# Patient Record
Sex: Female | Born: 1955
Health system: Southern US, Community
[De-identification: ages and names within clinical notes are randomized; demographics above are authoritative.]

## PROBLEM LIST (undated history)

## (undated) DIAGNOSIS — E785 Hyperlipidemia, unspecified: Secondary | ICD-10-CM

## (undated) DIAGNOSIS — J45909 Unspecified asthma, uncomplicated: Secondary | ICD-10-CM

## (undated) DIAGNOSIS — I10 Essential (primary) hypertension: Secondary | ICD-10-CM

## (undated) DIAGNOSIS — K759 Inflammatory liver disease, unspecified: Secondary | ICD-10-CM

## (undated) DIAGNOSIS — T7840XA Allergy, unspecified, initial encounter: Secondary | ICD-10-CM

## (undated) HISTORY — DX: Allergy, unspecified, initial encounter: T78.40XA

## (undated) HISTORY — PX: ROTATOR CUFF REPAIR: SHX139

## (undated) HISTORY — PX: SEPTOPLASTY: SHX2393

## (undated) HISTORY — PX: TUBAL LIGATION: SHX77

## (undated) HISTORY — DX: Unspecified asthma, uncomplicated: J45.909

---

## 1979-12-14 DIAGNOSIS — K759 Inflammatory liver disease, unspecified: Secondary | ICD-10-CM

## 1979-12-14 HISTORY — DX: Inflammatory liver disease, unspecified: K75.9

## 2000-06-21 ENCOUNTER — Inpatient Hospital Stay (HOSPITAL_COMMUNITY): Admission: EM | Admit: 2000-06-21 | Discharge: 2000-06-22 | Payer: Self-pay | Admitting: Emergency Medicine

## 2000-06-21 ENCOUNTER — Encounter: Payer: Self-pay | Admitting: Family Medicine

## 2000-06-30 ENCOUNTER — Encounter: Admission: RE | Admit: 2000-06-30 | Discharge: 2000-09-28 | Payer: Self-pay | Admitting: Internal Medicine

## 2001-06-12 ENCOUNTER — Encounter: Admission: RE | Admit: 2001-06-12 | Discharge: 2001-09-10 | Payer: Self-pay | Admitting: Internal Medicine

## 2001-08-23 ENCOUNTER — Emergency Department (HOSPITAL_COMMUNITY): Admission: EM | Admit: 2001-08-23 | Discharge: 2001-08-23 | Payer: Self-pay | Admitting: Emergency Medicine

## 2001-08-23 ENCOUNTER — Encounter: Payer: Self-pay | Admitting: Emergency Medicine

## 2001-10-05 ENCOUNTER — Encounter: Payer: Self-pay | Admitting: Internal Medicine

## 2001-10-05 ENCOUNTER — Encounter: Admission: RE | Admit: 2001-10-05 | Discharge: 2001-10-05 | Payer: Self-pay | Admitting: Internal Medicine

## 2003-03-31 ENCOUNTER — Emergency Department (HOSPITAL_COMMUNITY): Admission: EM | Admit: 2003-03-31 | Discharge: 2003-03-31 | Payer: Self-pay | Admitting: Emergency Medicine

## 2003-03-31 ENCOUNTER — Encounter: Payer: Self-pay | Admitting: Emergency Medicine

## 2005-05-28 ENCOUNTER — Ambulatory Visit: Payer: Self-pay | Admitting: Internal Medicine

## 2005-06-07 ENCOUNTER — Ambulatory Visit: Payer: Self-pay | Admitting: Internal Medicine

## 2005-06-08 ENCOUNTER — Ambulatory Visit: Payer: Self-pay | Admitting: *Deleted

## 2005-06-18 ENCOUNTER — Ambulatory Visit (HOSPITAL_COMMUNITY): Admission: RE | Admit: 2005-06-18 | Discharge: 2005-06-18 | Payer: Self-pay | Admitting: Family Medicine

## 2005-06-29 ENCOUNTER — Encounter: Admission: RE | Admit: 2005-06-29 | Discharge: 2005-06-29 | Payer: Self-pay | Admitting: Family Medicine

## 2005-09-10 ENCOUNTER — Ambulatory Visit: Payer: Self-pay | Admitting: Internal Medicine

## 2006-04-13 ENCOUNTER — Other Ambulatory Visit: Admission: RE | Admit: 2006-04-13 | Discharge: 2006-04-13 | Payer: Self-pay | Admitting: Internal Medicine

## 2008-06-01 ENCOUNTER — Emergency Department (HOSPITAL_COMMUNITY): Admission: EM | Admit: 2008-06-01 | Discharge: 2008-06-01 | Payer: Self-pay | Admitting: Emergency Medicine

## 2009-10-31 ENCOUNTER — Encounter: Payer: Self-pay | Admitting: Emergency Medicine

## 2009-10-31 ENCOUNTER — Ambulatory Visit: Payer: Self-pay | Admitting: Diagnostic Radiology

## 2009-11-01 ENCOUNTER — Inpatient Hospital Stay (HOSPITAL_COMMUNITY): Admission: EM | Admit: 2009-11-01 | Discharge: 2009-11-02 | Payer: Self-pay | Admitting: Internal Medicine

## 2009-11-18 ENCOUNTER — Inpatient Hospital Stay (HOSPITAL_COMMUNITY): Admission: EM | Admit: 2009-11-18 | Discharge: 2009-11-21 | Payer: Self-pay | Admitting: Emergency Medicine

## 2010-03-31 ENCOUNTER — Encounter: Admission: RE | Admit: 2010-03-31 | Discharge: 2010-03-31 | Payer: Self-pay | Admitting: Family Medicine

## 2011-03-16 LAB — GLUCOSE, CAPILLARY
Glucose-Capillary: 103 mg/dL — ABNORMAL HIGH (ref 70–99)
Glucose-Capillary: 110 mg/dL — ABNORMAL HIGH (ref 70–99)
Glucose-Capillary: 120 mg/dL — ABNORMAL HIGH (ref 70–99)
Glucose-Capillary: 131 mg/dL — ABNORMAL HIGH (ref 70–99)
Glucose-Capillary: 134 mg/dL — ABNORMAL HIGH (ref 70–99)
Glucose-Capillary: 150 mg/dL — ABNORMAL HIGH (ref 70–99)
Glucose-Capillary: 229 mg/dL — ABNORMAL HIGH (ref 70–99)
Glucose-Capillary: 244 mg/dL — ABNORMAL HIGH (ref 70–99)
Glucose-Capillary: 250 mg/dL — ABNORMAL HIGH (ref 70–99)
Glucose-Capillary: 251 mg/dL — ABNORMAL HIGH (ref 70–99)
Glucose-Capillary: 253 mg/dL — ABNORMAL HIGH (ref 70–99)
Glucose-Capillary: 266 mg/dL — ABNORMAL HIGH (ref 70–99)
Glucose-Capillary: 287 mg/dL — ABNORMAL HIGH (ref 70–99)
Glucose-Capillary: 521 mg/dL (ref 70–99)
Glucose-Capillary: 558 mg/dL (ref 70–99)

## 2011-03-16 LAB — BASIC METABOLIC PANEL
BUN: 16 mg/dL (ref 6–23)
BUN: 23 mg/dL (ref 6–23)
BUN: 6 mg/dL (ref 6–23)
CO2: 16 mEq/L — ABNORMAL LOW (ref 19–32)
Calcium: 8.2 mg/dL — ABNORMAL LOW (ref 8.4–10.5)
Chloride: 108 mEq/L (ref 96–112)
Chloride: 110 mEq/L (ref 96–112)
Chloride: 110 mEq/L (ref 96–112)
Creatinine, Ser: 0.79 mg/dL (ref 0.4–1.2)
GFR calc Af Amer: 55 mL/min — ABNORMAL LOW (ref >60)
GFR calc Af Amer: >60 mL/min (ref >60)
GFR calc Af Amer: >60 mL/min (ref >60)
GFR calc Af Amer: >60 mL/min (ref >60)
GFR calc non Af Amer: >60 mL/min (ref >60)
Glucose, Bld: 275 mg/dL — ABNORMAL HIGH (ref 70–99)
Glucose, Bld: 630 mg/dL (ref 70–99)
Potassium: 4.2 mEq/L (ref 3.5–5.1)
Potassium: 5.4 mEq/L — ABNORMAL HIGH (ref 3.5–5.1)
Sodium: 134 mEq/L — ABNORMAL LOW (ref 135–145)
Sodium: 138 mEq/L (ref 135–145)

## 2011-03-16 LAB — CBC
HCT: 29.8 % — ABNORMAL LOW (ref 36.0–46.0)
HCT: 40.1 % (ref 36.0–46.0)
Hemoglobin: 10 g/dL — ABNORMAL LOW (ref 12.0–15.0)
Hemoglobin: 13.8 g/dL (ref 12.0–15.0)
MCHC: 32.7 g/dL (ref 30.0–36.0)
MCHC: 32.9 g/dL (ref 30.0–36.0)
MCV: 85.6 fL (ref 78.0–100.0)
MCV: 86 fL (ref 78.0–100.0)
MCV: 86.3 fL (ref 78.0–100.0)
Platelets: 159 10*3/uL (ref 150–400)
Platelets: 199 10*3/uL (ref 150–400)
Platelets: 301 10*3/uL (ref 150–400)
RBC: 3.49 MIL/uL — ABNORMAL LOW (ref 3.87–5.11)
RBC: 3.84 MIL/uL — ABNORMAL LOW (ref 3.87–5.11)
RDW: 13.9 % (ref 11.5–15.5)
RDW: 14.1 % (ref 11.5–15.5)
WBC: 3.3 10*3/uL — ABNORMAL LOW (ref 4.0–10.5)

## 2011-03-16 LAB — URINALYSIS, ROUTINE W REFLEX MICROSCOPIC
Bilirubin Urine: NEGATIVE
Ketones, ur: >80 mg/dL — AB
Nitrite: NEGATIVE
Protein, ur: NEGATIVE mg/dL
Specific Gravity, Urine: 1.031 — ABNORMAL HIGH (ref 1.005–1.030)
Urobilinogen, UA: 0.2 mg/dL (ref 0.0–1.0)

## 2011-03-16 LAB — DIFFERENTIAL
Basophils Absolute: 0 10*3/uL (ref 0.0–0.1)
Basophils Absolute: 0.2 10*3/uL — ABNORMAL HIGH (ref 0.0–0.1)
Basophils Relative: 0 % (ref 0–1)
Basophils Relative: 1 % (ref 0–1)
Eosinophils Absolute: 0.1 10*3/uL (ref 0.0–0.7)
Eosinophils Relative: 0 % (ref 0–5)
Eosinophils Relative: 6 % — ABNORMAL HIGH (ref 0–5)
Lymphocytes Relative: 37 % (ref 12–46)
Lymphs Abs: 1.6 10*3/uL (ref 0.7–4.0)
Monocytes Absolute: 0.7 10*3/uL (ref 0.1–1.0)
Monocytes Relative: 4 % (ref 3–12)
Monocytes Relative: 9 % (ref 3–12)
Neutro Abs: 8.3 10*3/uL — ABNORMAL HIGH (ref 1.7–7.7)
Neutrophils Relative %: 75 % (ref 43–77)

## 2011-03-16 LAB — MAGNESIUM
Magnesium: 1.6 mg/dL (ref 1.5–2.5)
Magnesium: 1.7 mg/dL (ref 1.5–2.5)

## 2011-03-16 LAB — COMPREHENSIVE METABOLIC PANEL
AST: 136 U/L — ABNORMAL HIGH (ref 0–37)
CO2: 28 mEq/L (ref 19–32)
Calcium: 8.3 mg/dL — ABNORMAL LOW (ref 8.4–10.5)
Creatinine, Ser: 0.61 mg/dL (ref 0.4–1.2)
GFR calc Af Amer: >60 mL/min (ref >60)
GFR calc non Af Amer: >60 mL/min (ref >60)
Sodium: 138 mEq/L (ref 135–145)
Total Protein: 4.9 g/dL — ABNORMAL LOW (ref 6.0–8.3)

## 2011-03-16 LAB — CULTURE, BLOOD (ROUTINE X 2): Culture: NO GROWTH

## 2011-03-16 LAB — PHOSPHORUS: Phosphorus: 3.9 mg/dL (ref 2.3–4.6)

## 2011-03-16 LAB — URINE MICROSCOPIC-ADD ON

## 2011-03-16 LAB — HEMOGLOBIN A1C
Hgb A1c MFr Bld: 8.8 % — ABNORMAL HIGH (ref 4.6–6.1)
Mean Plasma Glucose: 206 mg/dL

## 2011-03-16 LAB — KETONES, QUALITATIVE

## 2011-03-17 LAB — COMPREHENSIVE METABOLIC PANEL
ALT: 95 U/L — ABNORMAL HIGH (ref 0–35)
Calcium: 8.8 mg/dL (ref 8.4–10.5)
GFR calc Af Amer: >60 mL/min (ref >60)
Glucose, Bld: 209 mg/dL — ABNORMAL HIGH (ref 70–99)
Sodium: 140 mEq/L (ref 135–145)
Total Protein: 5.5 g/dL — ABNORMAL LOW (ref 6.0–8.3)

## 2011-03-17 LAB — CBC
Hemoglobin: 12.8 g/dL (ref 12.0–15.0)
MCHC: 33.8 g/dL (ref 30.0–36.0)
MCHC: 34 g/dL (ref 30.0–36.0)
MCHC: 33.7 g/dL (ref 30.0–36.0)
MCV: 84.5 fL (ref 78.0–100.0)
MCV: 85.5 fL (ref 78.0–100.0)
Platelets: 203 10*3/uL (ref 150–400)
Platelets: 213 10*3/uL (ref 150–400)
Platelets: 261 10*3/uL (ref 150–400)
RDW: 13.3 % (ref 11.5–15.5)
RDW: 13.7 % (ref 11.5–15.5)

## 2011-03-17 LAB — URINALYSIS, ROUTINE W REFLEX MICROSCOPIC
Ketones, ur: NEGATIVE mg/dL
Nitrite: NEGATIVE
Protein, ur: NEGATIVE mg/dL
pH: 5.5 (ref 5.0–8.0)

## 2011-03-17 LAB — GLUCOSE, CAPILLARY
Glucose-Capillary: 187 mg/dL — ABNORMAL HIGH (ref 70–99)
Glucose-Capillary: 190 mg/dL — ABNORMAL HIGH (ref 70–99)
Glucose-Capillary: 240 mg/dL — ABNORMAL HIGH (ref 70–99)
Glucose-Capillary: 301 mg/dL — ABNORMAL HIGH (ref 70–99)

## 2011-03-17 LAB — BASIC METABOLIC PANEL
BUN: 12 mg/dL (ref 6–23)
BUN: 14 mg/dL (ref 6–23)
CO2: 27 mEq/L (ref 19–32)
CO2: 28 mEq/L (ref 19–32)
Calcium: 8.8 mg/dL (ref 8.4–10.5)
Chloride: 101 mEq/L (ref 96–112)
Chloride: 104 mEq/L (ref 96–112)
Creatinine, Ser: 0.64 mg/dL (ref 0.4–1.2)
Creatinine, Ser: 0.6 mg/dL (ref 0.4–1.2)
Glucose, Bld: 302 mg/dL — ABNORMAL HIGH (ref 70–99)

## 2011-03-17 LAB — DIFFERENTIAL
Basophils Relative: 1 % (ref 0–1)
Eosinophils Absolute: 0.3 10*3/uL (ref 0.0–0.7)
Neutrophils Relative %: 57 % (ref 43–77)

## 2011-03-17 LAB — URINE MICROSCOPIC-ADD ON

## 2011-03-17 LAB — URINE CULTURE
Colony Count: 5000
Special Requests: POSITIVE

## 2011-03-17 LAB — T4, FREE: Free T4: 1.2 ng/dL (ref 0.80–1.80)

## 2011-04-30 NOTE — Discharge Summary (Signed)
[facility]  Patient:    Kerry Compton, Kerry Compton                 MRN: [id number] Adm. Date:  [id number] Disc. Date: [id number] Attending:  Miguel Aschoff CC:         Kerry Compton, M.D.  Kerry Compton, M.D.   Discharge Summary  DATE OF BIRTH:  06-01-1956.  CONSULTS:  None.  PROCEDURES:  None.  DISCHARGE DIAGNOSES: 1. Severe gastroenteritis with intractable nausea, vomiting, and diarrhea. 2. Dehydration secondary to above. 3. Insulin requiring diabetes mellitus. 4. History of hypercholesterolemia. 5. History of preeclampsia. 6. History of septoplasty. 7. Bilateral tubal ligation.  DISCHARGE MEDICATIONS: 1. Humulin 70/30 45 units in the morning, 35 units in the evening. 2. Phenergan 25 p.o. q.4h. p.r.n. 3. Imodium p.r.n.  FOLLOW-UP:  She was instructed to make an appointment with Dr. Constance Goltz for follow-up as well as with the nutrition diabetes management center.  SUMMARY:  The patient is a 55 year old woman with a two day history of right lower quadrant pain improved today, although with frequent explosive diarrhea, nausea, vomiting, unaccompanied by fevers, chills, weight loss, rash, urinary symptoms, respiratory problems, chest pain.  She had eaten a cheeseburger at [employer] for lunch and spaghetti for dinner, and at approximately 8:00 p.m. began having significant symptoms.  In the emergency room, blood sugar was 46 (she had taken normal amounts of insulin prior to onset of symptoms) and received an amp of D-50 and two liters of IV fluids, still with unremitting symptoms.  Dr. Maurice Small was requested to admit the patient.  HOSPITAL COURSE:  Kerry Compton was felt to have gastroenteritis, and symptomatic therapy with IV fluids, Phenergan, and Imodium was initiated.  Her blood pressure at the time of admission was 154/99 with pulse of 105 supine, blood pressure 134/77 with pulse of 114 standing.  Hemoglobin was  12.6, white count 18.  BUN 13, creatinine 0.7, bicarb 24, electrolytes were normal.  On symptomatic therapy alone, Kerry Compton dramatically improved.  She was discharged approximately 30 hours after admission, able to drink fluids, experiencing no further nausea, vomiting, or diarrhea.  Her blood pressure and pulse had stabilized.  Stool was noted to be negative for white blood cells, and culture was negative as well.  Follow-up labs revealed normalization of her white count to 5.3, and hemodilution related drop in hemoglobin to 10.9 (she has been told she is anemic in the past).  Follow-up was arranged for Kerry Compton at [facility], where she claimed to follow with Dr. Constance Goltz.  After she left the hospital, it was found that Kerry Compton had been discharged from the practice. DD:  07/27/00 TD:  07/27/00 Job: [id number]

## 2011-09-09 LAB — CBC
HCT: 41.7
Hemoglobin: 14.1
MCHC: 33.8
MCV: 83.6
RBC: 4.99
RDW: 13.9

## 2011-09-09 LAB — URINALYSIS, ROUTINE W REFLEX MICROSCOPIC
Hgb urine dipstick: NEGATIVE
Ketones, ur: NEGATIVE
Protein, ur: NEGATIVE
Urobilinogen, UA: 0.2

## 2011-09-09 LAB — DIFFERENTIAL
Basophils Absolute: 0
Basophils Relative: 0
Eosinophils Relative: 1
Monocytes Absolute: 0.8
Monocytes Relative: 6

## 2011-09-09 LAB — BASIC METABOLIC PANEL
CO2: 24
Chloride: 109
Glucose, Bld: 202 — ABNORMAL HIGH
Potassium: 4.3
Sodium: 138

## 2011-09-09 LAB — URINE MICROSCOPIC-ADD ON

## 2011-09-20 ENCOUNTER — Encounter: Payer: Self-pay | Admitting: *Deleted

## 2011-09-20 ENCOUNTER — Emergency Department (HOSPITAL_BASED_OUTPATIENT_CLINIC_OR_DEPARTMENT_OTHER)
Admission: EM | Admit: 2011-09-20 | Discharge: 2011-09-21 | Disposition: A | Discharge status: Home / Self Care | Payer: BC Managed Care – PPO | Attending: Emergency Medicine | Admitting: Emergency Medicine

## 2011-09-20 DIAGNOSIS — R739 Hyperglycemia, unspecified: Secondary | ICD-10-CM

## 2011-09-20 DIAGNOSIS — E1169 Type 2 diabetes mellitus with other specified complication: Secondary | ICD-10-CM | POA: Insufficient documentation

## 2011-09-20 DIAGNOSIS — E785 Hyperlipidemia, unspecified: Secondary | ICD-10-CM | POA: Insufficient documentation

## 2011-09-20 DIAGNOSIS — Z79899 Other long term (current) drug therapy: Secondary | ICD-10-CM | POA: Insufficient documentation

## 2011-09-20 DIAGNOSIS — R110 Nausea: Secondary | ICD-10-CM | POA: Insufficient documentation

## 2011-09-20 DIAGNOSIS — I10 Essential (primary) hypertension: Secondary | ICD-10-CM | POA: Insufficient documentation

## 2011-09-20 HISTORY — DX: Hyperlipidemia, unspecified: E78.5

## 2011-09-20 HISTORY — DX: Essential (primary) hypertension: I10

## 2011-09-20 LAB — DIFFERENTIAL
Basophils Absolute: 0 10*3/uL (ref 0.0–0.1)
Eosinophils Relative: 0 % (ref 0–5)
Lymphocytes Relative: 10 % — ABNORMAL LOW (ref 12–46)
Monocytes Absolute: 0.8 10*3/uL (ref 0.1–1.0)
Monocytes Relative: 8 % (ref 3–12)
Neutro Abs: 8 10*3/uL — ABNORMAL HIGH (ref 1.7–7.7)

## 2011-09-20 LAB — GLUCOSE, CAPILLARY: Glucose-Capillary: 307 mg/dL — ABNORMAL HIGH (ref 70–99)

## 2011-09-20 LAB — URINALYSIS, ROUTINE W REFLEX MICROSCOPIC
Bilirubin Urine: NEGATIVE
Glucose, UA: >1000 mg/dL — AB
Hgb urine dipstick: NEGATIVE
Ketones, ur: >80 mg/dL — AB
Protein, ur: NEGATIVE mg/dL
pH: 5.5 (ref 5.0–8.0)

## 2011-09-20 LAB — CBC
HCT: 38.8 % (ref 36.0–46.0)
Hemoglobin: 13.2 g/dL (ref 12.0–15.0)
MCHC: 34 g/dL (ref 30.0–36.0)
MCV: 82.6 fL (ref 78.0–100.0)
RDW: 12.8 % (ref 11.5–15.5)

## 2011-09-20 LAB — URINE MICROSCOPIC-ADD ON

## 2011-09-20 MED ORDER — SODIUM CHLORIDE 0.9 % IV SOLN
Freq: Once | INTRAVENOUS | Status: AC
  Administered 2011-09-20: 22:00:00 via INTRAVENOUS

## 2011-09-20 NOTE — ED Notes (Signed)
Pt c/o BS reading " high" on machine at home. Pt has insulin pump.

## 2011-09-20 NOTE — ED Provider Notes (Addendum)
History     CSN: [id number] Arrival date & time: 09/20/2011  8:11 PM  Chief Complaint  Patient presents with  . Hyperglycemia    (Consider location/radiation/quality/duration/timing/severity/associated sxs/prior treatment) Patient is a 55 y.o. female presenting with general illness. The history is provided by the patient. No language interpreter was used.  Illness  The current episode started today. The onset was gradual. The problem occurs occasionally. The problem has been unchanged. The problem is severe. The symptoms are relieved by nothing. The symptoms are aggravated by nothing. Associated symptoms include diarrhea and nausea. Pertinent negatives include no fever.  Pt complains of her blood sugar being over 500.  Pt tried to bolus herself with her insulin pump and it would not work.  Pt replaced needle and tubing x 3.  Pt's Md had her use a new vial of insulin.   Past Medical History  Diagnosis Date  . Diabetes mellitus   . Hypertension   . Hyperlipemia     Past Surgical History  Procedure Date  . Tubal ligation   . Septoplasty   . Joint replacement     History reviewed. No pertinent family history.  History  Substance Use Topics  . Smoking status: Never Smoker   . Smokeless tobacco: Not on file  . Alcohol Use: No    OB History    Grav Para Term Preterm Abortions TAB SAB Ect Mult Living                  Review of Systems  Constitutional: Negative for fever.  Gastrointestinal: Positive for nausea and diarrhea.  All other systems reviewed and are negative.    Allergies  Azithromycin  Home Medications   Current Outpatient Rx  Name Route Sig Dispense Refill  . ATORVASTATIN CALCIUM 80 MG PO TABS Oral Take 80 mg by mouth daily.      . BUDESONIDE-FORMOTEROL FUMARATE 160-4.5 MCG/ACT IN AERO Inhalation Inhale 2 puffs into the lungs daily as needed. For shortness of breath     . INSULIN PUMP Subcutaneous Inject into the skin 3 times daily with meals, bedtime  and 2 AM.      . LOSARTAN POTASSIUM 100 MG PO TABS Oral Take 100 mg by mouth daily.      Marland Kitchen METFORMIN HCL 500 MG PO TABS Oral Take 500 mg by mouth 2 (two) times daily with a meal.      . ONE-DAILY MULTI VITAMINS PO TABS Oral Take 1 tablet by mouth daily.        BP 143/73  Pulse 83  Temp(Src) 97.6 F (36.4 C) (Oral)  Resp 16  Ht 5\' 4"  (1.626 m)  Wt 180 lb (81.647 kg)  BMI 30.90 kg/m2  SpO2 99%  Physical Exam  Nursing note and vitals reviewed. Constitutional: She is oriented to person, place, and time. She appears well-developed and well-nourished.  HENT:  Head: Normocephalic.  Eyes: Conjunctivae are normal. Pupils are equal, round, and reactive to light.  Neck: Normal range of motion. Neck supple.  Cardiovascular: Normal rate.   Pulmonary/Chest: Effort normal.  Abdominal: Soft.  Musculoskeletal: Normal range of motion.  Neurological: She is alert and oriented to person, place, and time. She has normal reflexes.  Skin: Skin is warm.  Psychiatric: She has a normal mood and affect.    ED Course  Procedures (including critical care time)  Labs Reviewed  GLUCOSE, CAPILLARY - Abnormal; Notable for the following:    Glucose-Capillary 557 (*)    All other components  within normal limits  POCT CBG MONITORING   No results found.   No diagnosis found.    MDM  Iv fluids started,  Labs ordered.  Pt reports her insulin pump began working,  Pt began giving herself insulin again.  Pt's glucose has gradually dropped.  I will give fluids,   Pt advised pt to see her MD for recheck in am.      Langston Masker, PA 09/20/11 2350  Langston Masker, [place] 09/25/11 1729

## 2011-09-21 LAB — GLUCOSE, CAPILLARY
Glucose-Capillary: 220 mg/dL — ABNORMAL HIGH (ref 70–99)
Glucose-Capillary: 312 mg/dL — ABNORMAL HIGH (ref 70–99)
Glucose-Capillary: 314 mg/dL — ABNORMAL HIGH (ref 70–99)
Glucose-Capillary: 326 mg/dL — ABNORMAL HIGH (ref 70–99)

## 2011-09-21 LAB — BASIC METABOLIC PANEL
BUN: 20 mg/dL (ref 6–23)
CO2: 26 mEq/L (ref 19–32)
Calcium: 9.7 mg/dL (ref 8.4–10.5)
Chloride: 99 mEq/L (ref 96–112)
Creatinine, Ser: 1 mg/dL (ref 0.50–1.10)

## 2011-09-21 MED ORDER — SODIUM CHLORIDE 0.9 % IV BOLUS (SEPSIS)
1000.0000 mL | Freq: Once | INTRAVENOUS | Status: AC
  Administered 2011-09-21 (×2): 1000 mL via INTRAVENOUS

## 2011-09-21 MED ORDER — INSULIN REGULAR HUMAN 100 UNIT/ML IJ SOLN
10.0000 [IU] | Freq: Once | INTRAMUSCULAR | Status: AC
Start: 2011-09-21 — End: 2011-09-21
  Administered 2011-09-21 (×2): 10 [IU] via SUBCUTANEOUS
  Filled 2011-09-21: qty 3

## 2011-09-21 MED ORDER — SODIUM CHLORIDE 0.9 % IV SOLN
Freq: Once | INTRAVENOUS | Status: AC
  Administered 2011-09-21: 01:00:00 via INTRAVENOUS

## 2011-09-21 MED ORDER — INSULIN ASPART 100 UNIT/ML ~~LOC~~ SOLN
10.0000 [IU] | Freq: Once | SUBCUTANEOUS | Status: DC
  Filled 2011-09-21: qty 3

## 2011-09-21 MED ORDER — INSULIN REGULAR HUMAN 100 UNIT/ML IJ SOLN
INTRAMUSCULAR | Status: AC
  Administered 2011-09-21: 10 [IU] via SUBCUTANEOUS
  Filled 2011-09-21: qty 30

## 2011-09-21 MED ORDER — SODIUM CHLORIDE 0.9 % IV SOLN
Freq: Once | INTRAVENOUS | Status: AC
  Administered 2011-09-21: 1000 mL via INTRAVENOUS

## 2011-09-21 NOTE — ED Notes (Signed)
cbg 326  MD notified

## 2011-09-21 NOTE — ED Notes (Signed)
Pt status has remained the same.  Up to BR x 2 tolerated well tolerated po flds. Hot pack applied to left hand for unsuccessful IV attempt.   Family has remained at bedside

## 2011-09-21 NOTE — ED Notes (Signed)
Pt ambulatory without difficulty. States she feels "fine" other than being "a little tired". Pt alert and oriented and states she feels well enough for discharge and in agreement to f/u for continued elevated blood sugars.

## 2011-09-21 NOTE — ED Provider Notes (Signed)
Repeat blood sugar after an additional liter of fluid to 20. I encouraged the patient to followup with her primary care physician in the morning for further evaluation of her insulin regimen. Her insulin pump is working and administering insulin one emergency department.  Dayton Bailiff, MD 09/21/11 [phone]

## 2011-09-21 NOTE — ED Provider Notes (Signed)
Medical screening examination/treatment/procedure(s) were performed by non-physician practitioner and as supervising physician I was immediately available for consultation/collaboration.   Lyanne Co, MD 09/21/11 [phone]

## 2011-10-04 NOTE — ED Provider Notes (Signed)
Medical screening examination/treatment/procedure(s) were performed by non-physician practitioner and as supervising physician I was immediately available for consultation/collaboration.   Amera Banos M Amor Packard, MD 10/04/11 0815 

## 2011-10-28 ENCOUNTER — Emergency Department (HOSPITAL_COMMUNITY)
Admission: EM | Admit: 2011-10-28 | Discharge: 2011-10-28 | Disposition: A | Discharge status: Home / Self Care | Payer: BC Managed Care – PPO | Attending: Emergency Medicine | Admitting: Emergency Medicine

## 2011-10-28 ENCOUNTER — Telehealth (HOSPITAL_COMMUNITY): Payer: Self-pay | Admitting: Emergency Medicine

## 2011-10-28 ENCOUNTER — Encounter (HOSPITAL_COMMUNITY): Payer: Self-pay

## 2011-10-28 DIAGNOSIS — I10 Essential (primary) hypertension: Secondary | ICD-10-CM | POA: Insufficient documentation

## 2011-10-28 DIAGNOSIS — IMO0001 Reserved for inherently not codable concepts without codable children: Secondary | ICD-10-CM

## 2011-10-28 DIAGNOSIS — Z794 Long term (current) use of insulin: Secondary | ICD-10-CM | POA: Insufficient documentation

## 2011-10-28 DIAGNOSIS — E1101 Type 2 diabetes mellitus with hyperosmolarity with coma: Secondary | ICD-10-CM | POA: Insufficient documentation

## 2011-10-28 LAB — LIPASE, BLOOD: Lipase: 11 U/L (ref 11–59)

## 2011-10-28 LAB — URINALYSIS, ROUTINE W REFLEX MICROSCOPIC
Glucose, UA: >1000 mg/dL — AB
Leukocytes, UA: NEGATIVE
Protein, ur: NEGATIVE mg/dL
Specific Gravity, Urine: 1.031 — ABNORMAL HIGH (ref 1.005–1.030)
pH: 5 (ref 5.0–8.0)

## 2011-10-28 LAB — BASIC METABOLIC PANEL
Calcium: 10.3 mg/dL (ref 8.4–10.5)
GFR calc Af Amer: 80 mL/min — ABNORMAL LOW (ref >90)
GFR calc non Af Amer: 69 mL/min — ABNORMAL LOW (ref >90)
Glucose, Bld: 507 mg/dL — ABNORMAL HIGH (ref 70–99)
Potassium: 4.7 mEq/L (ref 3.5–5.1)
Sodium: 135 mEq/L (ref 135–145)

## 2011-10-28 LAB — CBC
HCT: 41.9 % (ref 36.0–46.0)
Hemoglobin: 13.9 g/dL (ref 12.0–15.0)
RBC: 4.95 MIL/uL (ref 3.87–5.11)
WBC: 19.3 10*3/uL — ABNORMAL HIGH (ref 4.0–10.5)

## 2011-10-28 LAB — GLUCOSE, CAPILLARY
Glucose-Capillary: 483 mg/dL — ABNORMAL HIGH (ref 70–99)
Glucose-Capillary: 517 mg/dL — ABNORMAL HIGH (ref 70–99)

## 2011-10-28 LAB — HEPATIC FUNCTION PANEL
AST: 24 U/L (ref 0–37)
Albumin: 4 g/dL (ref 3.5–5.2)
Bilirubin, Direct: 0.2 mg/dL (ref 0.0–0.3)
Total Protein: 7.1 g/dL (ref 6.0–8.3)

## 2011-10-28 LAB — URINE MICROSCOPIC-ADD ON

## 2011-10-28 MED ORDER — SODIUM CHLORIDE 0.9 % IV BOLUS (SEPSIS)
1000.0000 mL | Freq: Once | INTRAVENOUS | Status: AC
  Administered 2011-10-28: 1000 mL via INTRAVENOUS

## 2011-10-28 MED ORDER — INSULIN ASPART 100 UNIT/ML ~~LOC~~ SOLN
SUBCUTANEOUS | Status: AC
  Administered 2011-10-28: 100 [IU]
  Filled 2011-10-28: qty 3

## 2011-10-28 MED ORDER — PROMETHAZINE HCL 25 MG/ML IJ SOLN
25.0000 mg | Freq: Once | INTRAMUSCULAR | Status: DC
  Filled 2011-10-28: qty 1

## 2011-10-28 MED ORDER — ONDANSETRON HCL 4 MG/2ML IJ SOLN
4.0000 mg | Freq: Once | INTRAMUSCULAR | Status: AC
  Administered 2011-10-28: 4 mg via INTRAVENOUS
  Filled 2011-10-28: qty 2

## 2011-10-28 MED ORDER — INSULIN ASPART 100 UNIT/ML ~~LOC~~ SOLN
10.0000 [IU] | Freq: Once | SUBCUTANEOUS | Status: AC
  Administered 2011-10-28: 10 [IU] via SUBCUTANEOUS

## 2011-10-28 MED ORDER — INSULIN REGULAR HUMAN 100 UNIT/ML IJ SOLN: 10.0000 [IU] | Freq: Once | INTRAMUSCULAR | Status: DC

## 2011-10-28 MED ORDER — INSULIN ASPART PROT & ASPART (70-30 MIX) 100 UNIT/ML ~~LOC~~ SUSP
10.0000 [IU] | Freq: Once | SUBCUTANEOUS | Status: DC
  Filled 2011-10-28: qty 3

## 2011-10-28 MED ORDER — KETOROLAC TROMETHAMINE 30 MG/ML IJ SOLN
30.0000 mg | Freq: Once | INTRAMUSCULAR | Status: AC
  Administered 2011-10-28: 30 mg via INTRAVENOUS
  Filled 2011-10-28: qty 1

## 2011-10-28 NOTE — ED Provider Notes (Signed)
Discussed with Dr. Sharl Ma who feels patient could be discharged home if clinical picture improved. He made an appointment in the office for noon tomorrow with the diabetic nurse coordinator, Ina Kick.  Rodena Medin, PA 10/28/11 1714

## 2011-10-28 NOTE — ED Notes (Signed)
Pt amb with steady gait to discharge window 

## 2011-10-28 NOTE — ED Provider Notes (Signed)
History     CSN: [id number] Arrival date & time: 10/28/2011 12:33 PM   First MD Initiated Contact with Patient 10/28/11 1307      Chief Complaint  Patient presents with  . Hyperglycemia  . Nausea  . Emesis     HPI Patient seen and evaluated at 1307. Patient reports that she has had nausea, vomiting, diarrhea for the past day. Reports some mild abdominal discomfort immediately before she vomits. Reports that her   Past Medical History  Diagnosis Date  . Diabetes mellitus   . Hypertension   . Hyperlipemia     Past Surgical History  Procedure Date  . Tubal ligation   . Septoplasty   . Joint replacement     History reviewed. No pertinent family history.  History  Substance Use Topics  . Smoking status: Never Smoker   . Smokeless tobacco: Not on file  . Alcohol Use: No    OB History    Grav Para Term Preterm Abortions TAB SAB Ect Mult Living                  Review of Systems  Allergies  Azithromycin  Home Medications   Current Outpatient Rx  Name Route Sig Dispense Refill  . ATORVASTATIN CALCIUM 80 MG PO TABS Oral Take 80 mg by mouth daily.      . BUDESONIDE-FORMOTEROL FUMARATE 160-4.5 MCG/ACT IN AERO Inhalation Inhale 2 puffs into the lungs daily as needed. For shortness of breath     . INSULIN PUMP Subcutaneous Inject into the skin 3 times daily with meals, bedtime and 2 AM. Novolog; basal rate 0.800 unit per hour from 12 am - 3 am; 3 am -7 am 0.900; 7 am - 12 am goes to 0.800    . LORATADINE 10 MG PO TABS Oral Take 10 mg by mouth daily.      Marland Kitchen LOSARTAN POTASSIUM 100 MG PO TABS Oral Take 100 mg by mouth daily.      Marland Kitchen METFORMIN HCL 500 MG PO TABS Oral Take 500 mg by mouth 2 (two) times daily with a meal.      . ONE-DAILY MULTI VITAMINS PO TABS Oral Take 1 tablet by mouth daily.        BP 97/67  Pulse 111  Temp(Src) 97.8 F (36.6 C) (Oral)  Resp 16  SpO2 100%  Physical Exam  ED Course  Procedures (including critical care time) 2:30 PM Patient  seen and evaluated.  VSS reviewed. . Nursing notes reviewed. Discussed with attending physician, Dr. Fredricka Bonine. Initial testing ordered. Will monitor the patient closely. They agree with the treatment plan and diagnosis. Reports that the patient should have lipase and hepatic function panel performed. No testing is necessary. No abdominal tenderness on examination. Discussed with him the elevated WBC, reports that it is likely splenic demargination due to the patients vomiting/diarrhea. This is evidence by elevated specific gravity of her urine.   Results for orders placed during the hospital encounter of 10/28/11  GLUCOSE, CAPILLARY      Component Value Range   Glucose-Capillary 483 (*) 70 - 99 (mg/dL)   Comment 1 Documented in Chart     Comment 2 Notify RN    BASIC METABOLIC PANEL      Component Value Range   Sodium 135  135 - 145 (mEq/L)   Potassium 4.7  3.5 - 5.1 (mEq/L)   Chloride 94 (*) 96 - 112 (mEq/L)   CO2 20  19 - 32 (mEq/L)  Glucose, Bld 507 (*) 70 - 99 (mg/dL)   BUN 30 (*) 6 - 23 (mg/dL)   Creatinine, Ser 4.09  0.50 - 1.10 (mg/dL)   Calcium 81.1  8.4 - 10.5 (mg/dL)   GFR calc non Af Amer 69 (*) >90 (mL/min)   GFR calc Af Amer 80 (*) >90 (mL/min)  CBC      Component Value Range   WBC 19.3 (*) 4.0 - 10.5 (K/uL)   RBC 4.95  3.87 - 5.11 (MIL/uL)   Hemoglobin 13.9  12.0 - 15.0 (g/dL)   HCT 91.4  78.2 - 95.6 (%)   MCV 84.6  78.0 - 100.0 (fL)   MCH 28.1  26.0 - 34.0 (pg)   MCHC 33.2  30.0 - 36.0 (g/dL)   RDW 21.3  08.6 - 57.8 (%)   Platelets 294  150 - 400 (K/uL)  URINALYSIS, ROUTINE W REFLEX MICROSCOPIC      Component Value Range   Color, Urine YELLOW  YELLOW    Appearance CLEAR  CLEAR    Specific Gravity, Urine 1.031 (*) 1.005 - 1.030    pH 5.0  5.0 - 8.0    Glucose, UA >1000 (*) NEGATIVE (mg/dL)   Hgb urine dipstick NEGATIVE  NEGATIVE    Bilirubin Urine NEGATIVE  NEGATIVE    Ketones, ur >80 (*) NEGATIVE (mg/dL)   Protein, ur NEGATIVE  NEGATIVE (mg/dL)   Urobilinogen,  UA 0.2  0.0 - 1.0 (mg/dL)   Nitrite NEGATIVE  NEGATIVE    Leukocytes, UA NEGATIVE  NEGATIVE   LIPASE, BLOOD      Component Value Range   Lipase 11  11 - 59 (U/L)  HEPATIC FUNCTION PANEL      Component Value Range   Total Protein 7.1  6.0 - 8.3 (g/dL)   Albumin 4.0  3.5 - 5.2 (g/dL)   AST 24  0 - 37 (U/L)   ALT 25  0 - 35 (U/L)   Alkaline Phosphatase 88  39 - 117 (U/L)   Total Bilirubin 0.9  0.3 - 1.2 (mg/dL)   Bilirubin, Direct 0.2  0.0 - 0.3 (mg/dL)   Indirect Bilirubin 0.7  0.3 - 0.9 (mg/dL)  URINE MICROSCOPIC-ADD ON      Component Value Range   Squamous Epithelial / LPF RARE  RARE    Bacteria, UA RARE  RARE   GLUCOSE, CAPILLARY      Component Value Range   Glucose-Capillary 517 (*) 70 - 99 (mg/dL)   Comment 1 Call MD NNP PA CNM     Patient seen and re-evaluated. Resting comfortably. VSS stable. NAD. Patient notified of testing results. Stated agreement and understanding. Patient stated understanding to treatment plan and diagnosis. Patients nausea has resolved. However her blood sugar is elevated. Had the patient suspend her insulin pump, gave the patient SQ insulin. Concern that the pump is not working. However she stated that the pump was working last night.   3:40 PM patient given SQ insulin. Patient care taken over by Calton Dach.    MDM  Hyperglyecemia Nausea, vomiting, diarrhea.        Demetrius Charity, PA 10/28/11 1540

## 2011-10-28 NOTE — ED Notes (Signed)
Patient walk to the bathroom

## 2011-10-28 NOTE — ED Notes (Signed)
Family at bedside. 

## 2011-10-28 NOTE — ED Provider Notes (Signed)
The patient is feeling improved. IV fluids going. I assumed patient care from [facility].  The patient states that a piece of her insulin pump malfunctioned, and that she has a replacement piece at home. Goal is to bring blood sugar down, hydrate and discharge home to follow up with Dr. Sharl Ma tomorrow for recheck.   Rodena Medin, PA 10/28/11 [phone]

## 2011-10-28 NOTE — ED Notes (Signed)
Pt with hx dm, here with c/o hyperglycemia since early this am, with n/v.

## 2011-10-29 NOTE — ED Provider Notes (Signed)
Medical screening examination/treatment/procedure(s) were performed by non-physician practitioner and as supervising physician I was immediately available for consultation/collaboration.   Gerhard Munch, MD 10/29/11 [phone]

## 2011-10-30 NOTE — ED Provider Notes (Signed)
Evaluation and management procedures were performed by the PA/NP under my supervision/collaboration.    Felisa Bonier, MD 10/30/11 [phone]

## 2011-10-30 NOTE — ED Provider Notes (Signed)
Evaluation and management procedures were performed by the PA/NP under my supervision/collaboration.    Felisa Bonier, MD 10/30/11 [phone]

## 2011-11-03 ENCOUNTER — Telehealth (HOSPITAL_COMMUNITY): Payer: Self-pay | Admitting: Emergency Medicine

## 2011-11-03 NOTE — Telephone Encounter (Signed)
NA

## 2013-03-19 ENCOUNTER — Ambulatory Visit (INDEPENDENT_AMBULATORY_CARE_PROVIDER_SITE_OTHER): Payer: BC Managed Care – PPO | Admitting: Family Medicine

## 2013-03-19 VITALS — BP 120/68 | HR 101 | Temp 98.1°F | Resp 18 | Ht 64.0 in | Wt 191.0 lb

## 2013-03-19 DIAGNOSIS — E860 Dehydration: Secondary | ICD-10-CM

## 2013-03-19 DIAGNOSIS — E1059 Type 1 diabetes mellitus with other circulatory complications: Secondary | ICD-10-CM

## 2013-03-19 DIAGNOSIS — R110 Nausea: Secondary | ICD-10-CM

## 2013-03-19 DIAGNOSIS — E785 Hyperlipidemia, unspecified: Secondary | ICD-10-CM | POA: Insufficient documentation

## 2013-03-19 DIAGNOSIS — I10 Essential (primary) hypertension: Secondary | ICD-10-CM

## 2013-03-19 DIAGNOSIS — E109 Type 1 diabetes mellitus without complications: Secondary | ICD-10-CM | POA: Insufficient documentation

## 2013-03-19 DIAGNOSIS — R7309 Other abnormal glucose: Secondary | ICD-10-CM

## 2013-03-19 DIAGNOSIS — R197 Diarrhea, unspecified: Secondary | ICD-10-CM

## 2013-03-19 DIAGNOSIS — R739 Hyperglycemia, unspecified: Secondary | ICD-10-CM

## 2013-03-19 DIAGNOSIS — J45909 Unspecified asthma, uncomplicated: Secondary | ICD-10-CM

## 2013-03-19 LAB — POCT CBC
Granulocyte percent: 85.6 %G — AB (ref 37–80)
HCT, POC: 42.2 % (ref 37.7–47.9)
Hemoglobin: 13.6 g/dL (ref 12.2–16.2)
Lymph, poc: 1.3 (ref 0.6–3.4)
MCH, POC: 27.9 pg (ref 27–31.2)
MCHC: 32.2 g/dL (ref 31.8–35.4)
MCV: 86.6 fL (ref 80–97)
MID (cbc): 0.5 (ref 0–0.9)
MPV: 9.8 fL (ref 0–99.8)
POC Granulocyte: 11 — AB (ref 2–6.9)
POC LYMPH PERCENT: 10.2 %L (ref 10–50)
POC MID %: 4.2 %M (ref 0–12)
Platelet Count, POC: 268 10*3/uL (ref 142–424)
RBC: 4.87 M/uL (ref 4.04–5.48)
RDW, POC: 13.4 %
WBC: 12.8 10*3/uL — AB (ref 4.6–10.2)

## 2013-03-19 LAB — POCT URINALYSIS DIPSTICK
Bilirubin, UA: NEGATIVE
Ketones, UA: >=160
pH, UA: 5.5

## 2013-03-19 LAB — COMPREHENSIVE METABOLIC PANEL
ALT: 19 U/L (ref 0–35)
AST: 19 U/L (ref 0–37)
Albumin: 3.9 g/dL (ref 3.5–5.2)
Alkaline Phosphatase: 89 U/L (ref 39–117)
BUN: 17 mg/dL (ref 6–23)
CO2: 25 mEq/L (ref 19–32)
Calcium: 9.6 mg/dL (ref 8.4–10.5)
Chloride: 102 mEq/L (ref 96–112)
Creat: 0.74 mg/dL (ref 0.50–1.10)
Glucose, Bld: 303 mg/dL — ABNORMAL HIGH (ref 70–99)
Potassium: 4.5 mEq/L (ref 3.5–5.3)
Sodium: 140 mEq/L (ref 135–145)
Total Bilirubin: 0.8 mg/dL (ref 0.3–1.2)
Total Protein: 6.2 g/dL (ref 6.0–8.3)

## 2013-03-19 LAB — POCT UA - MICROSCOPIC ONLY
Mucus, UA: NEGATIVE
WBC, Ur, HPF, POC: NEGATIVE
Yeast, UA: NEGATIVE

## 2013-03-19 LAB — GLUCOSE, POCT (MANUAL RESULT ENTRY)
POC Glucose: 269 mg/dl — AB (ref 70–99)
POC Glucose: 231 mg/dl — AB (ref 70–99)

## 2013-03-19 MED ORDER — PROMETHAZINE HCL 12.5 MG PO TABS: 12.5000 mg | ORAL_TABLET | Freq: Three times a day (TID) | ORAL | Status: DC | PRN

## 2013-03-19 NOTE — Progress Notes (Signed)
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  She has an insulin pump now with Novalog x 1 year.  Patient presents with diarrhea x 48 hours and nausea.  She also is aware of a high glucose for which she has taken extra Novalog today. Last time she ate or drank was last night.  She feels lightheaded when standing. She missed breakfast and lunch.  When she went to work at 8:30, she forgot to hook up her insulin pump and did not realize this until lunch time.  She's had no abdominal pain or vomiting.  No shortness of breath.  Associated with dull headache.  Lives with son who is out of town.   Objective:  Alert, occasionally twitching (she states this is a preexisting nervous twitch) HEENT:  Unremarkable including fundi and oroph. Neck:  Supple Chest:  Clear Heart:  II/VI systolic murmur best heard at LSB.  No rub or gallop Abdomen:  Hyperactive BS, nontender.  Insulin pump located right abdomen. Skin:  Dry without rash Ext:  Good pedal pulses without edema Neuro:  Moving 4 extremities well, cranial nerves are intact.  Results for orders placed in visit on 03/19/13  GLUCOSE, POCT (MANUAL RESULT ENTRY)      Result Value Range   POC Glucose 363 (*) 70 - 99 mg/dl  POCT UA - MICROSCOPIC ONLY      Result Value Range   WBC, Ur, HPF, POC neg     RBC, urine, microscopic 0-1     Bacteria, U Microscopic trace     Mucus, UA neg     Epithelial cells, urine per micros 0-1     Crystals, Ur, HPF, POC neg     Casts, Ur, LPF, POC neg     Yeast, UA neg    POCT URINALYSIS DIPSTICK      Result Value Range   Color, UA yellow     Clarity, UA cloudy     Glucose, UA 500     Bilirubin, UA neg     Ketones, UA >=160     Spec Grav, UA 1.025     Blood, UA trace     pH, UA 5.5     Protein, UA neg     Urobilinogen, UA 0.2     Nitrite, UA neg     Leukocytes, UA Negative    POCT CBC      Result Value Range   WBC 12.8 (*) 4.6 - 10.2  K/uL   Lymph, poc 1.3  0.6 - 3.4   POC LYMPH PERCENT 10.2  10 - 50 %L   MID (cbc) 0.5  0 - 0.9   POC MID % 4.2  0 - 12 %M   POC Granulocyte 11.0 (*) 2 - 6.9   Granulocyte percent 85.6 (*) 37 - 80 %G   RBC 4.87  4.04 - 5.48 M/uL   Hemoglobin 13.6  12.2 - 16.2 g/dL   HCT, POC 16.1  09.6 - 47.9 %   MCV 86.6  80 - 97 fL   MCH, POC 27.9  27 - 31.2 pg   MCHC 32.2  31.8 - 35.4 g/dL   RDW, POC 04.5     Platelet Count, POC 268  142 - 424 K/uL   MPV 9.8  0 - 99.8 fL   1:45 pm BS 363 2:35 pm-patient feeling better after 500 cc of IV fluids infused, pulse 90 2:50  pm BS 269 3:15 pm- patient comfortable (headache gone), 1000 cc IV fluids infused, pulse 80 3:30 pm-BS 231, no nausea  I was in and out of room for 2 hours while IV fluids were running in.  Assessment:  Dehydration, mild hyperglycemia with significant improvement following fluid resuscitation.

## 2013-03-19 NOTE — Patient Instructions (Addendum)
Dehydration, Adult Dehydration is when you lose more fluids from the body than you take in. Vital organs like the kidneys, brain, and heart cannot function without a proper amount of fluids and salt. Any loss of fluids from the body can cause dehydration.  CAUSES   Vomiting.  Diarrhea.  Excessive sweating.  Excessive urine output.  Fever. SYMPTOMS  Mild dehydration  Thirst.  Dry lips.  Slightly dry mouth. Moderate dehydration  Very dry mouth.  Sunken eyes.  Skin does not bounce back quickly when lightly pinched and released.  Dark urine and decreased urine production.  Decreased tear production.  Headache. Severe dehydration  Very dry mouth.  Extreme thirst.  Rapid, weak pulse (more than 100 beats per minute at rest).  Cold hands and feet.  Not able to sweat in spite of heat and temperature.  Rapid breathing.  Blue lips.  Confusion and lethargy.  Difficulty being awakened.  Minimal urine production.  No tears. DIAGNOSIS  Your caregiver will diagnose dehydration based on your symptoms and your exam. Blood and urine tests will help confirm the diagnosis. The diagnostic evaluation should also identify the cause of dehydration. TREATMENT  Treatment of mild or moderate dehydration can often be done at home by increasing the amount of fluids that you drink. It is best to drink small amounts of fluid more often. Drinking too much at one time can make vomiting worse. Refer to the home care instructions below. Severe dehydration needs to be treated at the hospital where you will probably be given intravenous (IV) fluids that contain water and electrolytes. HOME CARE INSTRUCTIONS   Ask your caregiver about specific rehydration instructions.  Drink enough fluids to keep your urine clear or pale yellow.  Drink small amounts frequently if you have nausea and vomiting.  Eat as you normally do.  Avoid:  Foods or drinks high in sugar.  Carbonated  drinks.  Juice.  Extremely hot or cold fluids.  Drinks with caffeine.  Fatty, greasy foods.  Alcohol.  Tobacco.  Overeating.  Gelatin desserts.  Wash your hands well to avoid spreading bacteria and viruses.  Only take over-the-counter or prescription medicines for pain, discomfort, or fever as directed by your caregiver.  Ask your caregiver if you should continue all prescribed and over-the-counter medicines.  Keep all follow-up appointments with your caregiver. SEEK MEDICAL CARE IF:  You have abdominal pain and it increases or stays in one area (localizes).  You have a rash, stiff neck, or severe headache.  You are irritable, sleepy, or difficult to awaken.  You are weak, dizzy, or extremely thirsty. SEEK IMMEDIATE MEDICAL CARE IF:   You are unable to keep fluids down or you get worse despite treatment.  You have frequent episodes of vomiting or diarrhea.  You have blood or green matter (bile) in your vomit.  You have blood in your stool or your stool looks black and tarry.  You have not urinated in 6 to 8 hours, or you have only urinated a small amount of very dark urine.  You have a fever.  You faint. MAKE SURE YOU:   Understand these instructions.  Will watch your condition.  Will get help right away if you are not doing well or get worse. Document Released: 11/29/2005 Document Revised: 02/21/2012 Document Reviewed: 07/19/2011 Exit[employer] Patient Information 2013 [employer].  

## 2013-11-16 ENCOUNTER — Other Ambulatory Visit: Payer: Self-pay | Admitting: Internal Medicine

## 2013-11-16 DIAGNOSIS — E042 Nontoxic multinodular goiter: Secondary | ICD-10-CM

## 2013-11-23 ENCOUNTER — Other Ambulatory Visit: Payer: BC Managed Care – PPO

## 2013-11-26 ENCOUNTER — Other Ambulatory Visit: Payer: BC Managed Care – PPO

## 2013-12-04 ENCOUNTER — Ambulatory Visit
Admission: RE | Admit: 2013-12-04 | Discharge: 2013-12-04 | Disposition: A | Discharge status: Home / Self Care | Payer: BC Managed Care – PPO | Source: Ambulatory Visit | Attending: Internal Medicine | Admitting: Internal Medicine

## 2013-12-04 DIAGNOSIS — E042 Nontoxic multinodular goiter: Secondary | ICD-10-CM

## 2013-12-19 ENCOUNTER — Other Ambulatory Visit: Payer: Self-pay | Admitting: Internal Medicine

## 2013-12-19 DIAGNOSIS — E041 Nontoxic single thyroid nodule: Secondary | ICD-10-CM

## 2013-12-20 ENCOUNTER — Other Ambulatory Visit (HOSPITAL_COMMUNITY)
Admission: RE | Admit: 2013-12-20 | Discharge: 2013-12-20 | Disposition: A | Discharge status: Home / Self Care | Payer: BC Managed Care – PPO | Source: Ambulatory Visit | Attending: Interventional Radiology | Admitting: Interventional Radiology

## 2013-12-20 ENCOUNTER — Ambulatory Visit
Admission: RE | Admit: 2013-12-20 | Discharge: 2013-12-20 | Disposition: A | Discharge status: Home / Self Care | Payer: BC Managed Care – PPO | Source: Ambulatory Visit | Attending: Internal Medicine | Admitting: Internal Medicine

## 2013-12-20 DIAGNOSIS — E041 Nontoxic single thyroid nodule: Secondary | ICD-10-CM | POA: Insufficient documentation

## 2014-03-27 ENCOUNTER — Other Ambulatory Visit: Payer: Self-pay | Admitting: Gastroenterology

## 2014-05-27 ENCOUNTER — Encounter (HOSPITAL_COMMUNITY): Payer: Self-pay | Admitting: Pharmacy Technician

## 2014-06-03 ENCOUNTER — Encounter (HOSPITAL_COMMUNITY): Payer: Self-pay | Admitting: *Deleted

## 2014-06-17 ENCOUNTER — Encounter (HOSPITAL_COMMUNITY): Payer: Self-pay | Admitting: Anesthesiology

## 2014-06-17 NOTE — Anesthesia Preprocedure Evaluation (Addendum)
Anesthesia Evaluation  Patient identified by MRN, date of birth, ID band Patient awake    Reviewed: Allergy & Precautions, H&P , NPO status , Patient's Chart, lab work & pertinent test results  Airway Mallampati: II TM Distance: >3 FB Neck ROM: Full    Dental no notable dental hx.    Pulmonary asthma ,  breath sounds clear to auscultation  Pulmonary exam normal       Cardiovascular hypertension, Pt. on medications Rhythm:Regular Rate:Normal     Neuro/Psych negative neurological ROS  negative psych ROS   GI/Hepatic negative GI ROS, (+) Hepatitis -  Endo/Other  diabetes, Type 2, Insulin Dependent, Oral Hypoglycemic Agents  Renal/GU negative Renal ROS  negative genitourinary   Musculoskeletal negative musculoskeletal ROS (+)   Abdominal (+) + obese,   Peds negative pediatric ROS (+)  Hematology negative hematology ROS (+)   Anesthesia Other Findings   Reproductive/Obstetrics negative OB ROS                         Anesthesia Physical Anesthesia Plan  ASA: III  Anesthesia Plan: MAC   Post-op Pain Management:    Induction: Intravenous  Airway Management Planned:   Additional Equipment:   Intra-op Plan:   Post-operative Plan:   Informed Consent: I have reviewed the patients History and Physical, chart, labs and discussed the procedure including the risks, benefits and alternatives for the proposed anesthesia with the patient or authorized representative who has indicated his/her understanding and acceptance.   Dental advisory given  Plan Discussed with: CRNA  Anesthesia Plan Comments:       Anesthesia Quick Evaluation

## 2014-06-18 ENCOUNTER — Ambulatory Visit (HOSPITAL_COMMUNITY)
Admission: RE | Admit: 2014-06-18 | Discharge: 2014-06-18 | Disposition: A | Discharge status: Home / Self Care | Payer: BC Managed Care – PPO | Source: Ambulatory Visit | Attending: Gastroenterology | Admitting: Gastroenterology

## 2014-06-18 ENCOUNTER — Encounter (HOSPITAL_COMMUNITY)
Admission: RE | Disposition: A | Discharge status: Home / Self Care | Payer: Self-pay | Source: Ambulatory Visit | Attending: Gastroenterology

## 2014-06-18 ENCOUNTER — Ambulatory Visit (HOSPITAL_COMMUNITY): Payer: BC Managed Care – PPO | Admitting: Anesthesiology

## 2014-06-18 ENCOUNTER — Encounter (HOSPITAL_COMMUNITY): Payer: BC Managed Care – PPO | Admitting: Anesthesiology

## 2014-06-18 ENCOUNTER — Encounter (HOSPITAL_COMMUNITY): Payer: Self-pay | Admitting: *Deleted

## 2014-06-18 DIAGNOSIS — B189 Chronic viral hepatitis, unspecified: Secondary | ICD-10-CM | POA: Diagnosis not present

## 2014-06-18 DIAGNOSIS — I10 Essential (primary) hypertension: Secondary | ICD-10-CM | POA: Insufficient documentation

## 2014-06-18 DIAGNOSIS — Z794 Long term (current) use of insulin: Secondary | ICD-10-CM | POA: Insufficient documentation

## 2014-06-18 DIAGNOSIS — Z1211 Encounter for screening for malignant neoplasm of colon: Secondary | ICD-10-CM | POA: Diagnosis present

## 2014-06-18 DIAGNOSIS — Z6830 Body mass index (BMI) 30.0-30.9, adult: Secondary | ICD-10-CM | POA: Insufficient documentation

## 2014-06-18 DIAGNOSIS — E669 Obesity, unspecified: Secondary | ICD-10-CM | POA: Diagnosis not present

## 2014-06-18 DIAGNOSIS — Z79899 Other long term (current) drug therapy: Secondary | ICD-10-CM | POA: Insufficient documentation

## 2014-06-18 DIAGNOSIS — E119 Type 2 diabetes mellitus without complications: Secondary | ICD-10-CM | POA: Diagnosis not present

## 2014-06-18 DIAGNOSIS — J45909 Unspecified asthma, uncomplicated: Secondary | ICD-10-CM | POA: Insufficient documentation

## 2014-06-18 HISTORY — DX: Inflammatory liver disease, unspecified: K75.9

## 2014-06-18 HISTORY — PX: COLONOSCOPY WITH PROPOFOL: SHX5780

## 2014-06-18 LAB — GLUCOSE, CAPILLARY: GLUCOSE-CAPILLARY: 152 mg/dL — AB (ref 70–99)

## 2014-06-18 SURGERY — COLONOSCOPY WITH PROPOFOL
Anesthesia: Monitor Anesthesia Care

## 2014-06-18 MED ORDER — SODIUM CHLORIDE 0.9 % IV SOLN: INTRAVENOUS | Status: DC

## 2014-06-18 MED ORDER — PROPOFOL INFUSION 10 MG/ML OPTIME
INTRAVENOUS | Status: DC | PRN
  Administered 2014-06-18: 300 ug/kg/min via INTRAVENOUS

## 2014-06-18 MED ORDER — PROPOFOL 10 MG/ML IV BOLUS
INTRAVENOUS | Status: AC
  Filled 2014-06-18: qty 20

## 2014-06-18 MED ORDER — LACTATED RINGERS IV SOLN
INTRAVENOUS | Status: DC
  Administered 2014-06-18: 10:00:00 via INTRAVENOUS

## 2014-06-18 SURGICAL SUPPLY — 22 items

## 2014-06-18 NOTE — Transfer of Care (Signed)
Immediate Anesthesia Transfer of Care Note  Patient: Kerry Compton  Procedure(s) Performed: Procedure(s): COLONOSCOPY WITH PROPOFOL (N/A)  Patient Location: PACU and Endoscopy Unit  Anesthesia Type:MAC  Level of Consciousness: alert  and patient cooperative  Airway & Oxygen Therapy: Patient Spontanous Breathing and Patient connected to face mask oxygen  Post-op Assessment: Report given to PACU RN and Post -op Vital signs reviewed and stable  Post vital signs: Reviewed and stable  Complications: No apparent anesthesia complications

## 2014-06-18 NOTE — Anesthesia Postprocedure Evaluation (Signed)
  Anesthesia Post-op Note  Patient: Kerry Compton  Procedure(s) Performed: Procedure(s) (LRB): COLONOSCOPY WITH PROPOFOL (N/A)  Patient Location: PACU  Anesthesia Type: MAC  Level of Consciousness: awake and alert   Airway and Oxygen Therapy: Patient Spontanous Breathing  Post-op Pain: mild  Post-op Assessment: Post-op Vital signs reviewed, Patient's Cardiovascular Status Stable, Respiratory Function Stable, Patent Airway and No signs of Nausea or vomiting  Last Vitals:  Filed Vitals:   06/18/14 1128  BP: 131/80  Pulse: 86  Temp:   Resp: 15    Post-op Vital Signs: stable   Complications: No apparent anesthesia complications

## 2014-06-18 NOTE — Op Note (Signed)
Procedure: Baseline screening colonoscopy  Endoscopist: Danise EdgeMartin Johnson  Premedication: Propofol administered by anesthesia  Procedure: The patient was placed in the left lateral decubitus position. Anal inspection and digital rectal exam were normal. The Pentax pediatric colonoscope was introduced into the rectum and advanced to the cecum. A normal-appearing appendiceal orifice and ileocecal valve were identified. Colonic preparation for the exam today was good.  Rectum. Normal. Retroflexed view of the distal rectum normal  Sigmoid colon and descending colon. Normal  Splenic flexure. Normal  Transverse colon. Normal  Hepatic flexure. Normal  Ascending colon. Normal  Cecum and ileocecal valve. Normal  Assessment: Normal screening colonoscopy  Recommendation: Schedule repeat screening colonoscopy in 10 years

## 2014-06-18 NOTE — H&P (Signed)
  Procedure: Baseline screening colonoscopy  History: The patient is a 58 year old female born Mar 02, 1956. She is scheduled to undergo her first screening colonoscopy with polypectomy to prevent colon cancer.  Past medical history: Tubal ligation. Septoplasty. Arthroscopic right shoulder surgery. Type 2 diabetes mellitus. Glaucoma. Hypercholesterolemia. Hypertension. Asthma. Plantar fasciitis. Chronic headaches  Family history: Negative for colon cancer  Exam: The patient is alert and lying comfortably on the endoscopy stretcher. Abdomen is soft and nontender to palpation. Lungs are clear to auscultation. Cardiac exam reveals a regular rhythm  Plan: Proceed with baseline screening colonoscopy

## 2014-06-19 ENCOUNTER — Encounter (HOSPITAL_COMMUNITY): Payer: Self-pay | Admitting: Gastroenterology

## 2014-06-26 ENCOUNTER — Other Ambulatory Visit: Payer: Self-pay | Admitting: Gastroenterology

## 2014-06-26 ENCOUNTER — Ambulatory Visit
Admission: RE | Admit: 2014-06-26 | Discharge: 2014-06-26 | Disposition: A | Discharge status: Home / Self Care | Payer: BC Managed Care – PPO | Source: Ambulatory Visit | Attending: Gastroenterology | Admitting: Gastroenterology

## 2014-06-26 DIAGNOSIS — R109 Unspecified abdominal pain: Secondary | ICD-10-CM

## 2015-02-08 ENCOUNTER — Inpatient Hospital Stay (HOSPITAL_COMMUNITY)
Admission: EM | Admit: 2015-02-08 | Discharge: 2015-02-10 | DRG: 638 | Disposition: A | Discharge status: Home / Self Care | Payer: BLUE CROSS/BLUE SHIELD | Attending: Internal Medicine | Admitting: Internal Medicine

## 2015-02-08 ENCOUNTER — Encounter (HOSPITAL_COMMUNITY): Payer: Self-pay | Admitting: Emergency Medicine

## 2015-02-08 DIAGNOSIS — E109 Type 1 diabetes mellitus without complications: Secondary | ICD-10-CM | POA: Diagnosis present

## 2015-02-08 DIAGNOSIS — Z881 Allergy status to other antibiotic agents status: Secondary | ICD-10-CM

## 2015-02-08 DIAGNOSIS — E869 Volume depletion, unspecified: Secondary | ICD-10-CM | POA: Diagnosis present

## 2015-02-08 DIAGNOSIS — E1010 Type 1 diabetes mellitus with ketoacidosis without coma: Principal | ICD-10-CM | POA: Diagnosis present

## 2015-02-08 DIAGNOSIS — R112 Nausea with vomiting, unspecified: Secondary | ICD-10-CM

## 2015-02-08 DIAGNOSIS — N179 Acute kidney failure, unspecified: Secondary | ICD-10-CM | POA: Diagnosis present

## 2015-02-08 DIAGNOSIS — E1065 Type 1 diabetes mellitus with hyperglycemia: Secondary | ICD-10-CM

## 2015-02-08 DIAGNOSIS — D72829 Elevated white blood cell count, unspecified: Secondary | ICD-10-CM | POA: Diagnosis present

## 2015-02-08 DIAGNOSIS — J45909 Unspecified asthma, uncomplicated: Secondary | ICD-10-CM | POA: Diagnosis present

## 2015-02-08 DIAGNOSIS — Z825 Family history of asthma and other chronic lower respiratory diseases: Secondary | ICD-10-CM

## 2015-02-08 DIAGNOSIS — I10 Essential (primary) hypertension: Secondary | ICD-10-CM | POA: Diagnosis present

## 2015-02-08 DIAGNOSIS — Z833 Family history of diabetes mellitus: Secondary | ICD-10-CM | POA: Diagnosis not present

## 2015-02-08 DIAGNOSIS — E1110 Type 2 diabetes mellitus with ketoacidosis without coma: Secondary | ICD-10-CM | POA: Diagnosis present

## 2015-02-08 DIAGNOSIS — Z8249 Family history of ischemic heart disease and other diseases of the circulatory system: Secondary | ICD-10-CM | POA: Diagnosis not present

## 2015-02-08 DIAGNOSIS — E785 Hyperlipidemia, unspecified: Secondary | ICD-10-CM | POA: Diagnosis present

## 2015-02-08 DIAGNOSIS — Z794 Long term (current) use of insulin: Secondary | ICD-10-CM | POA: Diagnosis not present

## 2015-02-08 DIAGNOSIS — J4520 Mild intermittent asthma, uncomplicated: Secondary | ICD-10-CM

## 2015-02-08 DIAGNOSIS — E0810 Diabetes mellitus due to underlying condition with ketoacidosis without coma: Secondary | ICD-10-CM | POA: Insufficient documentation

## 2015-02-08 DIAGNOSIS — A084 Viral intestinal infection, unspecified: Secondary | ICD-10-CM | POA: Diagnosis present

## 2015-02-08 DIAGNOSIS — Z79899 Other long term (current) drug therapy: Secondary | ICD-10-CM

## 2015-02-08 DIAGNOSIS — R197 Diarrhea, unspecified: Secondary | ICD-10-CM

## 2015-02-08 LAB — BLOOD GAS, VENOUS
Acid-base deficit: 16.2 mmol/L — ABNORMAL HIGH (ref 0.0–2.0)
BICARBONATE: 13 meq/L — AB (ref 20.0–24.0)
O2 Saturation: 49.9 %
PH VEN: 7.125 — AB (ref 7.250–7.300)
Patient temperature: 98.6
TCO2: 12.3 mmol/L (ref 0–100)
pCO2, Ven: 41.3 mmHg — ABNORMAL LOW (ref 45.0–50.0)
pO2, Ven: 32.5 mmHg (ref 30.0–45.0)

## 2015-02-08 LAB — GLUCOSE, CAPILLARY
GLUCOSE-CAPILLARY: 216 mg/dL — AB (ref 70–99)
GLUCOSE-CAPILLARY: 107 mg/dL — AB (ref 70–99)
GLUCOSE-CAPILLARY: 85 mg/dL (ref 70–99)
GLUCOSE-CAPILLARY: 93 mg/dL (ref 70–99)
Glucose-Capillary: 142 mg/dL — ABNORMAL HIGH (ref 70–99)
Glucose-Capillary: 116 mg/dL — ABNORMAL HIGH (ref 70–99)
Glucose-Capillary: 146 mg/dL — ABNORMAL HIGH (ref 70–99)

## 2015-02-08 LAB — URINALYSIS, ROUTINE W REFLEX MICROSCOPIC
Bilirubin Urine: NEGATIVE
Hgb urine dipstick: NEGATIVE
Ketones, ur: >80 mg/dL — AB
Leukocytes, UA: NEGATIVE
NITRITE: NEGATIVE
PROTEIN: NEGATIVE mg/dL
SPECIFIC GRAVITY, URINE: 1.029 (ref 1.005–1.030)
Urobilinogen, UA: 0.2 mg/dL (ref 0.0–1.0)
pH: 5 (ref 5.0–8.0)

## 2015-02-08 LAB — COMPREHENSIVE METABOLIC PANEL
ALBUMIN: 4.5 g/dL (ref 3.5–5.2)
ALK PHOS: 114 U/L (ref 39–117)
ALT: 36 U/L — AB (ref 0–35)
ANION GAP: 20 — AB (ref 5–15)
AST: 33 U/L (ref 0–37)
BUN: 28 mg/dL — ABNORMAL HIGH (ref 6–23)
CO2: 13 mmol/L — ABNORMAL LOW (ref 19–32)
CREATININE: 1.2 mg/dL — AB (ref 0.50–1.10)
Calcium: 9.8 mg/dL (ref 8.4–10.5)
Chloride: 107 mmol/L (ref 96–112)
GFR calc Af Amer: 57 mL/min — ABNORMAL LOW (ref >90)
GFR calc non Af Amer: 49 mL/min — ABNORMAL LOW (ref >90)
Glucose, Bld: 331 mg/dL — ABNORMAL HIGH (ref 70–99)
Potassium: 5 mmol/L (ref 3.5–5.1)
SODIUM: 140 mmol/L (ref 135–145)
Total Bilirubin: 2.1 mg/dL — ABNORMAL HIGH (ref 0.3–1.2)
Total Protein: 7.2 g/dL (ref 6.0–8.3)

## 2015-02-08 LAB — BASIC METABOLIC PANEL
ANION GAP: 10 (ref 5–15)
Anion gap: 10 (ref 5–15)
BUN: 28 mg/dL — AB (ref 6–23)
BUN: 26 mg/dL — AB (ref 6–23)
CHLORIDE: 115 mmol/L — AB (ref 96–112)
CHLORIDE: 114 mmol/L — AB (ref 96–112)
CO2: 14 mmol/L — ABNORMAL LOW (ref 19–32)
CO2: 13 mmol/L — AB (ref 19–32)
CREATININE: 0.94 mg/dL (ref 0.50–1.10)
Calcium: 8.6 mg/dL (ref 8.4–10.5)
Calcium: 8.5 mg/dL (ref 8.4–10.5)
Creatinine, Ser: 0.95 mg/dL (ref 0.50–1.10)
GFR calc non Af Amer: 66 mL/min — ABNORMAL LOW (ref >90)
GFR, EST AFRICAN AMERICAN: 75 mL/min — AB (ref >90)
GFR, EST AFRICAN AMERICAN: 76 mL/min — AB (ref >90)
GFR, EST NON AFRICAN AMERICAN: 65 mL/min — AB (ref >90)
GLUCOSE: 180 mg/dL — AB (ref 70–99)
Glucose, Bld: 105 mg/dL — ABNORMAL HIGH (ref 70–99)
POTASSIUM: 4.4 mmol/L (ref 3.5–5.1)
Potassium: 4.8 mmol/L (ref 3.5–5.1)
SODIUM: 139 mmol/L (ref 135–145)
SODIUM: 137 mmol/L (ref 135–145)

## 2015-02-08 LAB — CBG MONITORING, ED
GLUCOSE-CAPILLARY: 287 mg/dL — AB (ref 70–99)
Glucose-Capillary: 282 mg/dL — ABNORMAL HIGH (ref 70–99)
Glucose-Capillary: 273 mg/dL — ABNORMAL HIGH (ref 70–99)
Glucose-Capillary: 200 mg/dL — ABNORMAL HIGH (ref 70–99)

## 2015-02-08 LAB — CBC WITH DIFFERENTIAL/PLATELET
Basophils Absolute: 0.1 10*3/uL (ref 0.0–0.1)
Basophils Relative: 0 % (ref 0–1)
EOS PCT: 0 % (ref 0–5)
Eosinophils Absolute: 0 10*3/uL (ref 0.0–0.7)
HCT: 46.6 % — ABNORMAL HIGH (ref 36.0–46.0)
HEMOGLOBIN: 15.1 g/dL — AB (ref 12.0–15.0)
LYMPHS PCT: 5 % — AB (ref 12–46)
Lymphs Abs: 1.1 10*3/uL (ref 0.7–4.0)
MCH: 28.6 pg (ref 26.0–34.0)
MCHC: 32.4 g/dL (ref 30.0–36.0)
MCV: 88.3 fL (ref 78.0–100.0)
MONO ABS: 0.7 10*3/uL (ref 0.1–1.0)
MONOS PCT: 4 % (ref 3–12)
NEUTROS ABS: 18.1 10*3/uL — AB (ref 1.7–7.7)
NEUTROS PCT: 91 % — AB (ref 43–77)
PLATELETS: 342 10*3/uL (ref 150–400)
RBC: 5.28 MIL/uL — AB (ref 3.87–5.11)
RDW: 13 % (ref 11.5–15.5)
WBC: 20 10*3/uL — ABNORMAL HIGH (ref 4.0–10.5)

## 2015-02-08 LAB — MRSA PCR SCREENING: MRSA by PCR: NEGATIVE

## 2015-02-08 LAB — URINE MICROSCOPIC-ADD ON: URINE-OTHER: NONE SEEN

## 2015-02-08 MED ORDER — METOCLOPRAMIDE HCL 5 MG/ML IJ SOLN
10.0000 mg | Freq: Once | INTRAMUSCULAR | Status: AC
  Administered 2015-02-08: 10 mg via INTRAVENOUS
  Filled 2015-02-08: qty 2

## 2015-02-08 MED ORDER — ALBUTEROL SULFATE HFA 108 (90 BASE) MCG/ACT IN AERS: 1.0000 | INHALATION_SPRAY | Freq: Four times a day (QID) | RESPIRATORY_TRACT | Status: DC | PRN

## 2015-02-08 MED ORDER — INSULIN GLARGINE 100 UNIT/ML ~~LOC~~ SOLN
20.0000 [IU] | Freq: Every day | SUBCUTANEOUS | Status: DC
  Administered 2015-02-08 – 2015-02-09 (×2): 20 [IU] via SUBCUTANEOUS
  Filled 2015-02-08 (×2): qty 0.2

## 2015-02-08 MED ORDER — ONDANSETRON HCL 4 MG/2ML IJ SOLN: 4.0000 mg | Freq: Four times a day (QID) | INTRAMUSCULAR | Status: DC | PRN

## 2015-02-08 MED ORDER — INSULIN ASPART 100 UNIT/ML ~~LOC~~ SOLN
0.0000 [IU] | Freq: Every day | SUBCUTANEOUS | Status: DC
  Administered 2015-02-08 – 2015-02-09 (×2): 2 [IU] via SUBCUTANEOUS

## 2015-02-08 MED ORDER — SODIUM CHLORIDE 0.9 % IV SOLN
INTRAVENOUS | Status: DC
  Administered 2015-02-08: 21:00:00 via INTRAVENOUS

## 2015-02-08 MED ORDER — SODIUM CHLORIDE 0.9 % IV SOLN
INTRAVENOUS | Status: DC
  Administered 2015-02-08: 22:00:00 via INTRAVENOUS

## 2015-02-08 MED ORDER — INSULIN REGULAR HUMAN 100 UNIT/ML IJ SOLN
INTRAMUSCULAR | Status: DC
  Filled 2015-02-08: qty 2.5

## 2015-02-08 MED ORDER — DEXTROSE-NACL 5-0.45 % IV SOLN
INTRAVENOUS | Status: DC
  Administered 2015-02-08: 21:00:00 via INTRAVENOUS

## 2015-02-08 MED ORDER — POTASSIUM CHLORIDE 10 MEQ/100ML IV SOLN
10.0000 meq | INTRAVENOUS | Status: AC
  Administered 2015-02-08 (×2): 10 meq via INTRAVENOUS
  Filled 2015-02-08 (×2): qty 100

## 2015-02-08 MED ORDER — SODIUM CHLORIDE 0.9 % IV SOLN: INTRAVENOUS | Status: DC

## 2015-02-08 MED ORDER — INSULIN ASPART 100 UNIT/ML ~~LOC~~ SOLN
0.0000 [IU] | Freq: Three times a day (TID) | SUBCUTANEOUS | Status: DC
  Administered 2015-02-09 (×2): 4 [IU] via SUBCUTANEOUS
  Administered 2015-02-10: 3 [IU] via SUBCUTANEOUS

## 2015-02-08 MED ORDER — HEPARIN SODIUM (PORCINE) 5000 UNIT/ML IJ SOLN
5000.0000 [IU] | Freq: Three times a day (TID) | INTRAMUSCULAR | Status: DC
  Administered 2015-02-08 – 2015-02-10 (×5): 5000 [IU] via SUBCUTANEOUS
  Filled 2015-02-08 (×6): qty 1

## 2015-02-08 MED ORDER — DEXTROSE-NACL 5-0.45 % IV SOLN
INTRAVENOUS | Status: DC
  Administered 2015-02-08: 12:00:00 via INTRAVENOUS

## 2015-02-08 MED ORDER — DEXTROSE-NACL 5-0.45 % IV SOLN: INTRAVENOUS | Status: DC

## 2015-02-08 MED ORDER — LORATADINE 10 MG PO TABS
10.0000 mg | ORAL_TABLET | Freq: Every day | ORAL | Status: DC
  Administered 2015-02-08 – 2015-02-10 (×3): 10 mg via ORAL
  Filled 2015-02-08 (×4): qty 1

## 2015-02-08 MED ORDER — SODIUM CHLORIDE 0.9 % IV BOLUS (SEPSIS)
2000.0000 mL | Freq: Once | INTRAVENOUS | Status: AC
  Administered 2015-02-08: 2000 mL via INTRAVENOUS

## 2015-02-08 MED ORDER — ATORVASTATIN CALCIUM 80 MG PO TABS
80.0000 mg | ORAL_TABLET | Freq: Every day | ORAL | Status: DC
  Administered 2015-02-08 – 2015-02-09 (×2): 80 mg via ORAL
  Filled 2015-02-08: qty 2
  Filled 2015-02-08 (×2): qty 1

## 2015-02-08 MED ORDER — SODIUM CHLORIDE 0.9 % IV SOLN
INTRAVENOUS | Status: AC
  Administered 2015-02-08: 2.2 [IU]/h via INTRAVENOUS
  Filled 2015-02-08: qty 2.5

## 2015-02-08 MED ORDER — SODIUM CHLORIDE 0.9 % IV SOLN: 1000.0000 mL | INTRAVENOUS | Status: DC

## 2015-02-08 MED ORDER — ALBUTEROL SULFATE (2.5 MG/3ML) 0.083% IN NEBU: 2.5000 mg | INHALATION_SOLUTION | Freq: Four times a day (QID) | RESPIRATORY_TRACT | Status: DC | PRN

## 2015-02-08 MED ORDER — ATORVASTATIN CALCIUM 80 MG PO TABS
80.0000 mg | ORAL_TABLET | Freq: Every day | ORAL | Status: DC
  Filled 2015-02-08: qty 1

## 2015-02-08 MED ORDER — SODIUM CHLORIDE 0.9 % IV SOLN
INTRAVENOUS | Status: DC
  Administered 2015-02-08: 10:00:00 via INTRAVENOUS

## 2015-02-08 NOTE — ED Notes (Signed)
Attempted to call report to floor, was advised that a RN had not been assigned to room yet and will call back when they are ready

## 2015-02-08 NOTE — ED Notes (Signed)
Unable to triage pt promptly due to patient being in the bathroom having diarrhea.  Pt states that she is a T1 diabetic and has been having NVD and gen abd pain (right before she vomits).  Pt states she has had 3 episodes of vomiting and 6 episodes of diarrhea since last night.

## 2015-02-08 NOTE — H&P (Signed)
History and Physical:    Kerry Compton ZOX:[id number] DOB: 1956/06/07 DOA: 02/08/2015  Referring physician: Doug Sou, MD PCP: Cala Bradford, MD  Endocrinologist: Dr. Sharl Ma  Chief Complaint: N/V/D  History of Present Illness:   Kerry Compton is an 59 y.o. female with a PMH of insulin dependent DM who reports 3 episodes of vomiting and 6 episodes of diarrhea over the past 24 hours.  Family members recently had a GI illness.  She treated herself with Alka-Seltzer last night, without relief. She did give herself extra insulin 11 PM last night, because her sugars were running high (in the 200's although she cannot remember exactly).  She tells me Dr. Sharl Ma checks her hemoglobin A1c every 3 months, and that her average blood sugar is around 154.  She was excessively thirsty with excessive urination through the night.  She had abdominal pain, but it has eased off after being given anti-emetics.  Upon initial evaluation in the ED, the patient was found to be in DKA with a glucose of 331 and a bicarb of 13 (anion gap 20).    ROS:   Constitutional: No fever, no chills;  Appetite diminished; No weight loss, + weight gain, no fatigue.  HEENT: No blurry vision, no diplopia, no pharyngitis, no dysphagia CV: No chest pain, no palpitations, no PND, no orthopnea, no edema.  Resp: No SOB, no cough, no pleuritic pain. GI: + nausea, + vomiting, + diarrhea, no melena, no hematochezia, no constipation, no abdominal pain.  GU: No dysuria, no hematuria, + frequency, no urgency. MSK: no myalgias, no arthralgias.  Neuro:  No headache, no focal neurological deficits, no history of seizures.  Psych: No depression, no anxiety.  Endo: No heat intolerance, no cold intolerance, + polyuria, + polydipsia  Skin: No rashes, no skin lesions.  Heme: No easy bruising.  Travel history: No recent travel.   Past Medical History:   Past Medical History  Diagnosis Date  . Diabetes mellitus   . Hypertension   .  Hyperlipemia   . Allergy   . Asthma   . Hepatitis 1981    type a from seafood, no current liver problems    Past Surgical History:   Past Surgical History  Procedure Laterality Date  . Tubal ligation    . Septoplasty    . Rotator cuff repair Right 10 yrs ago  . Colonoscopy with propofol N/A 06/18/2014    Procedure: COLONOSCOPY WITH PROPOFOL;  Surgeon: Charolett Bumpers, MD;  Location: [facility];  Service: Endoscopy;  Laterality: N/A;    Social History:   History   Social History  . Marital Status: Single    Spouse Name: N/A  . Number of Children: 2  . Years of Education: N/A   Occupational History  . Accounts receivable.    Social History Main Topics  . Smoking status: Never Smoker   . Smokeless tobacco: Never Used  . Alcohol Use: 0.0 oz/week    0 Standard drinks or equivalent per week     Comment: Rare  . Drug Use: No  . Sexual Activity: No   Other Topics Concern  . Not on file   Social History Narrative   Divorced, lives with son.    Family history:   Family History  Problem Relation Age of Onset  . Diabetes Mother   . Asthma Mother   . Arthritis Mother   . Diabetes Brother   . Diabetes Maternal Grandmother   . Diabetes Brother   .  Hypertension Brother   . Hyperlipidemia Brother     Allergies   Azithromycin  Current Medications:   Prior to Admission medications   Medication Sig Start Date End Date Taking? Authorizing Provider  albuterol (PROVENTIL HFA;VENTOLIN HFA) 108 (90 BASE) MCG/ACT inhaler Inhale into the lungs every 6 (six) hours as needed for wheezing or shortness of breath.   Yes Historical Provider, MD  atorvastatin (LIPITOR) 80 MG tablet Take 80 mg by mouth daily.     Yes Historical Provider, MD  Canagliflozin (INVOKANA) 100 MG TABS Take 100 mg by mouth daily.    Yes Historical Provider, MD  Insulin Human (INSULIN PUMP) 100 unit/ml SOLN Inject into the skin continuous. Novolog insulin pump basal rate 1200 am to 300 am = 1.0 600 am  to 600 pm = 0.90   Yes Historical Provider, MD  loratadine (CLARITIN) 10 MG tablet Take 10 mg by mouth daily.     Yes Historical Provider, MD  losartan (COZAAR) 50 MG tablet Take 50 mg by mouth every morning.   Yes Historical Provider, MD  metFORMIN (GLUCOPHAGE) 500 MG tablet Take 500 mg by mouth daily.    Yes Historical Provider, MD  Multiple Vitamin (MULTIVITAMIN WITH MINERALS) TABS tablet Take 1 tablet by mouth daily.   Yes Historical Provider, MD    Physical Exam:   Filed Vitals:   02/08/15 0856 02/08/15 1039 02/08/15 1121 02/08/15 1231  BP: 103/63 107/67 105/69 102/50  Pulse: 108 99 98 119  Temp:      TempSrc:      Resp: SpO2: 99% 99% 100%      Physical Exam: Blood pressure 102/50, pulse 119, temperature 97.8 F (36.6 C), temperature source Oral, resp. rate 16, SpO2 100 %. Gen: No acute distress. Head: Normocephalic, atraumatic. Eyes: PERRL, EOMI, sclerae nonicteric. Mouth: Oropharynx clear with dry mucous membranes. Neck: Supple, no thyromegaly, no lymphadenopathy, no jugular venous distention. Chest: Lungs CTAB. CV: Heart sounds are regular/tachy.  No M/R/G. Abdomen: Soft, nontender, nondistended with normal active bowel sounds. Extremities: Extremities are without C/E/C. Skin: Warm and dry. Neuro: Alert and oriented times 3; cranial nerves II through XII grossly intact. Psych: Mood and affect normal.   Data Review:    Labs: Basic Metabolic Panel:  Recent Labs Lab 02/08/15 0820  NA 140  K 5.0  CL 107  CO2 13*  GLUCOSE 331*  BUN 28*  CREATININE 1.20*  CALCIUM 9.8   Liver Function Tests:  Recent Labs Lab 02/08/15 0820  AST 33  ALT 36*  ALKPHOS 114  BILITOT 2.1*  PROT 7.2  ALBUMIN 4.5   CBC:  Recent Labs Lab 02/08/15 0820  WBC 20.0*  NEUTROABS 18.1*  HGB 15.1*  HCT 46.6*  MCV 88.3  PLT 342   CBG:  Recent Labs Lab 02/08/15 0756 02/08/15 1009 02/08/15 1112 02/08/15 1216  GLUCAP 287* 282* 273* 200*    Radiographic  Studies: No results found.   Assessment/Plan:   Principal Problem:   DKA (diabetic ketoacidoses) / DM type 1  Status post IV fluid bolus in the ER consisting of normal saline x2 liters, continued to vigorously hydrate with normal saline.  Start on insulin drip per glucose stabilizer protocol with every hour CBG checks and every 4 hour BMET checks until stable.  Supplement potassium if needed. 2 runs ordered.  Add dextrose to IV fluids when serum glucose less than 250.  Transition to basal/bolus insulin when the ketoacidosis has resolved and the patient is  able to eat. Continue IV insulin infusion for one to two hours after initiating the SQ insulin, to avoid recurrent hyperglycemia.  DM coordinator consultation.  Trigger likely gastroenteritis.  Active Problems:   Hyperlipidemia  Hold Lipitor until N/V improved.    Asthma, chronic  Stable, continue BDs PRN.    Hypertension  Hold Cozaar given AKI.    Gastroenteritis and colitis, viral / Nausea vomiting and diarrhea  Supportive care with anti-emetics.  Obtain stool for C. Diff given leukocytosis.  Probable viral given history of sick contacts.    AKI (acute kidney injury)  Likely pre-renal.  Hydrate and monitor.    DVT prophylaxis  Heparin ordered.  Code Status: Full. Family Communication: Alvia GroveMiranda Lone [phone] (daughter) Disposition Plan: Home when stable.  Time spent: 1 hour.  RAMA,CHRISTINA [facility] Pager [phone] Cell: [phone]   If 7PM-7AM, please contact night-coverage [email or web address] Password [email or web address] 02/08/2015, 3:09 PM

## 2015-02-08 NOTE — ED Notes (Signed)
Pt ambulated to BR and back to room w/o difficulty and steady gait with son as standby assist

## 2015-02-08 NOTE — ED Provider Notes (Signed)
CSN: [id number]     Arrival date & time 02/08/15  [id number] History   First MD Initiated Contact with Patient 02/08/15 [phone]     Chief Complaint  Patient presents with  . Nausea  . Emesis  . Diabetes     (Consider location/radiation/quality/duration/timing/severity/associated sxs/prior Treatment) HPI Patient reports vomiting nausea and diarrhea onset yesterday afternoon. She's had 3 episodes of vomiting and 6 episodes of diarrhea no blood per rectum no hematemesis. No fever. She does admit to diffuse abdominal discomfort immediately prior to vomiting however no discomfort presently. Presently she feels dehydrated and nauseated. She treated herself with Alka-Seltzer last night, without relief. She did give herself extra insulin 11 PM last night. Nothing makes symptoms better or worse. No other associated symptoms. Past Medical History  Diagnosis Date  . Diabetes mellitus   . Hypertension   . Hyperlipemia   . Allergy   . Asthma   . Hepatitis 1981    type a from seafood, no current liver problems   Past Surgical History  Procedure Laterality Date  . Tubal ligation    . Septoplasty    . Rotator cuff repair Right 10 yrs ago  . Colonoscopy with propofol N/A 06/18/2014    Procedure: COLONOSCOPY WITH PROPOFOL;  Surgeon: Charolett BumpersMartin K Johnson, MD;  Location: [facility];  Service: Endoscopy;  Laterality: N/A;   Family History  Problem Relation Age of Onset  . Diabetes Mother   . Asthma Mother   . Arthritis Mother   . Diabetes Brother   . Diabetes Maternal Grandmother   . Diabetes Brother   . Hypertension Brother   . Hyperlipidemia Brother    History  Substance Use Topics  . Smoking status: Never Smoker   . Smokeless tobacco: Never Used  . Alcohol Use: Yes     Comment: rare   OB History    No data available     Review of Systems  Constitutional: Negative.        Feels dehydrated  HENT: Negative.   Respiratory: Negative.   Cardiovascular: Negative.   Gastrointestinal: Positive  for nausea, vomiting, abdominal pain and diarrhea.  Musculoskeletal: Negative.   Skin: Negative.   Neurological: Negative.   Psychiatric/Behavioral: Negative.   All other systems reviewed and are negative.     Allergies  Azithromycin  Home Medications   Prior to Admission medications   Medication Sig Start Date End Date Taking? Authorizing Provider  albuterol (PROVENTIL HFA;VENTOLIN HFA) 108 (90 BASE) MCG/ACT inhaler Inhale into the lungs every 6 (six) hours as needed for wheezing or shortness of breath.    Historical Provider, MD  atorvastatin (LIPITOR) 80 MG tablet Take 80 mg by mouth daily.      Historical Provider, MD  budesonide-formoterol (SYMBICORT) 160-4.5 MCG/ACT inhaler Inhale 2 puffs into the lungs daily as needed. For shortness of breath     Historical Provider, MD  Canagliflozin (INVOKANA) 100 MG TABS Take 100 mg by mouth daily.     Historical Provider, MD  Insulin Human (INSULIN PUMP) 100 unit/ml SOLN Inject into the skin continuous. Novolog insulin pump basal rate 1200 am to 300 am = 1.0 600 am to 600 pm = 0.90    Historical Provider, MD  loratadine (CLARITIN) 10 MG tablet Take 10 mg by mouth daily.      Historical Provider, MD  losartan (COZAAR) 50 MG tablet Take 50 mg by mouth every morning.    Historical Provider, MD  metFORMIN (GLUCOPHAGE) 500 MG tablet Take 500 mg by  mouth daily.     Historical Provider, MD  Multiple Vitamin (MULTIVITAMIN WITH MINERALS) TABS tablet Take 1 tablet by mouth daily.    Historical Provider, MD   BP 101/62 mmHg  Pulse 120  Temp(Src) 97.8 F (36.6 C) (Oral)  Resp 18  SpO2 100% Physical Exam  Constitutional: She is oriented to person, place, and time. She appears well-developed and well-nourished. No distress.  Alert nontoxic  HENT:  Head: Normocephalic and atraumatic.  Right Ear: External ear normal.  Left Ear: External ear normal.  Mucous membranes dry  Eyes: Conjunctivae are normal. Pupils are equal, round, and reactive to  light.  Neck: Neck supple. No tracheal deviation present. No thyromegaly present.  Cardiovascular: Normal rate and regular rhythm.   No murmur heard. Tachycardic  Pulmonary/Chest: Effort normal and breath sounds normal.  Abdominal: Soft. Bowel sounds are normal. She exhibits no distension. There is no tenderness.  Musculoskeletal: Normal range of motion. She exhibits no edema or tenderness.  Neurological: She is alert and oriented to person, place, and time. No cranial nerve deficit. Coordination normal.  Skin: Skin is warm and dry. No rash noted.  Psychiatric: She has a normal mood and affect.  Nursing note and vitals reviewed.   ED Course  Procedures (including critical care time) Labs Review Labs Reviewed  COMPREHENSIVE METABOLIC PANEL  CBC WITH DIFFERENTIAL/PLATELET  URINALYSIS, ROUTINE W REFLEX MICROSCOPIC  BLOOD GAS, VENOUS    Imaging Review No results found.   EKG Interpretation None     Patient feels much improved after treatment with intravenous fluids and intravenous antiemetic medication.. Intravenous insulin drip via glucose stabilizes ordered. Results for orders placed or performed during the hospital encounter of 02/08/15  Comprehensive metabolic panel  Result Value Ref Range   Sodium 140 135 - 145 mmol/L   Potassium 5.0 3.5 - 5.1 mmol/L   Chloride 107 96 - 112 mmol/L   CO2 13 (L) 19 - 32 mmol/L   Glucose, Bld 331 (H) 70 - 99 mg/dL   BUN 28 (H) 6 - 23 mg/dL   Creatinine, Ser 1.61 (H) 0.50 - 1.10 mg/dL   Calcium 9.8 8.4 - 09.6 mg/dL   Total Protein 7.2 6.0 - 8.3 g/dL   Albumin 4.5 3.5 - 5.2 g/dL   AST 33 0 - 37 U/L   ALT 36 (H) 0 - 35 U/L   Alkaline Phosphatase 114 39 - 117 U/L   Total Bilirubin 2.1 (H) 0.3 - 1.2 mg/dL   GFR calc non Af Amer 49 (L) >90 mL/min   GFR calc Af Amer 57 (L) >90 mL/min   Anion gap 20 (H) 5 - 15  CBC with Differential/Platelet  Result Value Ref Range   WBC 20.0 (H) 4.0 - 10.5 K/uL   RBC 5.28 (H) 3.87 - 5.11 MIL/uL    Hemoglobin 15.1 (H) 12.0 - 15.0 g/dL   HCT 04.5 (H) 40.9 - 81.1 %   MCV 88.3 78.0 - 100.0 fL   MCH 28.6 26.0 - 34.0 pg   MCHC 32.4 30.0 - 36.0 g/dL   RDW 91.4 78.2 - 95.6 %   Platelets 342 150 - 400 K/uL   Neutrophils Relative % 91 (H) 43 - 77 %   Neutro Abs 18.1 (H) 1.7 - 7.7 K/uL   Lymphocytes Relative 5 (L) 12 - 46 %   Lymphs Abs 1.1 0.7 - 4.0 K/uL   Monocytes Relative 4 3 - 12 %   Monocytes Absolute 0.7 0.1 - 1.0 K/uL  Eosinophils Relative 0 0 - 5 %   Eosinophils Absolute 0.0 0.0 - 0.7 K/uL   Basophils Relative 0 0 - 1 %   Basophils Absolute 0.1 0.0 - 0.1 K/uL  Blood gas, venous  Result Value Ref Range   pH, Ven 7.125 (LL) 7.250 - 7.300   pCO2, Ven 41.3 (L) 45.0 - 50.0 mmHg   pO2, Ven 32.5 30.0 - 45.0 mmHg   Bicarbonate 13.0 (L) 20.0 - 24.0 mEq/L   TCO2 12.3 0 - 100 mmol/L   Acid-base deficit 16.2 (H) 0.0 - 2.0 mmol/L   O2 Saturation 49.9 %   Patient temperature 98.6    Collection site VENOUS    Drawn by COLLECTED BY LABORATORY    Sample type VENOUS   CBG monitoring, ED  Result Value Ref Range   Glucose-Capillary 287 (H) 70 - 99 mg/dL   Comment 1 Notify RN    No results found.  MDM   Final diagnoses:  None   spoke with Dr.Rama who will arrange for inpatient stay to stepdown bed Diagnosis diabetic ketoacidosis  CRITICAL CARE Performed by: Doug Sou Total critical care time: 30 minute Critical care time was exclusive of separately billable procedures and treating other patients. Critical care was necessary to treat or prevent imminent or life-threatening deterioration. Critical care was time spent personally by me on the following activities: development of treatment plan with patient and/or surrogate as well as nursing, discussions with consultants, evaluation of patient's response to treatment, examination of patient, obtaining history from patient or surrogate, ordering and performing treatments and interventions, ordering and review of laboratory studies,  ordering and review of radiographic studies, pulse oximetry and re-evaluation of patient's condition.    Doug Sou, MD 02/08/15 [phone]

## 2015-02-08 NOTE — ED Notes (Signed)
Pharmacy sts that they are still working on insulin and as soon as it is ready, they will send

## 2015-02-09 DIAGNOSIS — E0810 Diabetes mellitus due to underlying condition with ketoacidosis without coma: Secondary | ICD-10-CM

## 2015-02-09 DIAGNOSIS — N179 Acute kidney failure, unspecified: Secondary | ICD-10-CM | POA: Insufficient documentation

## 2015-02-09 DIAGNOSIS — D72829 Elevated white blood cell count, unspecified: Secondary | ICD-10-CM

## 2015-02-09 LAB — BASIC METABOLIC PANEL
ANION GAP: 7 (ref 5–15)
Anion gap: 10 (ref 5–15)
Anion gap: 9 (ref 5–15)
BUN: 25 mg/dL — ABNORMAL HIGH (ref 6–23)
BUN: 23 mg/dL (ref 6–23)
BUN: 27 mg/dL — AB (ref 6–23)
CALCIUM: 8.6 mg/dL (ref 8.4–10.5)
CHLORIDE: 110 mmol/L (ref 96–112)
CHLORIDE: 108 mmol/L (ref 96–112)
CO2: 17 mmol/L — ABNORMAL LOW (ref 19–32)
CO2: 15 mmol/L — ABNORMAL LOW (ref 19–32)
CO2: 19 mmol/L (ref 19–32)
Calcium: 8.3 mg/dL — ABNORMAL LOW (ref 8.4–10.5)
Calcium: 8.8 mg/dL (ref 8.4–10.5)
Chloride: 110 mmol/L (ref 96–112)
Creatinine, Ser: 0.93 mg/dL (ref 0.50–1.10)
Creatinine, Ser: 0.84 mg/dL (ref 0.50–1.10)
Creatinine, Ser: 0.77 mg/dL (ref 0.50–1.10)
GFR calc Af Amer: 77 mL/min — ABNORMAL LOW (ref >90)
GFR calc Af Amer: 87 mL/min — ABNORMAL LOW (ref >90)
GFR calc non Af Amer: 75 mL/min — ABNORMAL LOW (ref >90)
GFR calc non Af Amer: >90 mL/min (ref >90)
GFR, EST NON AFRICAN AMERICAN: 66 mL/min — AB (ref >90)
Glucose, Bld: 279 mg/dL — ABNORMAL HIGH (ref 70–99)
Glucose, Bld: 196 mg/dL — ABNORMAL HIGH (ref 70–99)
Glucose, Bld: 116 mg/dL — ABNORMAL HIGH (ref 70–99)
Potassium: 4.2 mmol/L (ref 3.5–5.1)
Potassium: 4.1 mmol/L (ref 3.5–5.1)
Potassium: 3.7 mmol/L (ref 3.5–5.1)
SODIUM: 133 mmol/L — AB (ref 135–145)
Sodium: 134 mmol/L — ABNORMAL LOW (ref 135–145)
Sodium: 138 mmol/L (ref 135–145)

## 2015-02-09 LAB — CLOSTRIDIUM DIFFICILE BY PCR: Toxigenic C. Difficile by PCR: NEGATIVE

## 2015-02-09 LAB — GLUCOSE, CAPILLARY
GLUCOSE-CAPILLARY: 118 mg/dL — AB (ref 70–99)
GLUCOSE-CAPILLARY: 181 mg/dL — AB (ref 70–99)
GLUCOSE-CAPILLARY: 206 mg/dL — AB (ref 70–99)
GLUCOSE-CAPILLARY: 214 mg/dL — AB (ref 70–99)
GLUCOSE-CAPILLARY: 251 mg/dL — AB (ref 70–99)
Glucose-Capillary: 202 mg/dL — ABNORMAL HIGH (ref 70–99)
Glucose-Capillary: 186 mg/dL — ABNORMAL HIGH (ref 70–99)
Glucose-Capillary: 113 mg/dL — ABNORMAL HIGH (ref 70–99)
Glucose-Capillary: 165 mg/dL — ABNORMAL HIGH (ref 70–99)

## 2015-02-09 LAB — CBC WITH DIFFERENTIAL/PLATELET
BASOS PCT: 0 % (ref 0–1)
Basophils Absolute: 0 10*3/uL (ref 0.0–0.1)
EOS ABS: 0.1 10*3/uL (ref 0.0–0.7)
EOS PCT: 1 % (ref 0–5)
HCT: 41 % (ref 36.0–46.0)
HEMOGLOBIN: 13.6 g/dL (ref 12.0–15.0)
LYMPHS PCT: 19 % (ref 12–46)
Lymphs Abs: 2.5 10*3/uL (ref 0.7–4.0)
MCH: 28.9 pg (ref 26.0–34.0)
MCHC: 33.2 g/dL (ref 30.0–36.0)
MCV: 87 fL (ref 78.0–100.0)
MONO ABS: 0.9 10*3/uL (ref 0.1–1.0)
Monocytes Relative: 7 % (ref 3–12)
NEUTROS PCT: 73 % (ref 43–77)
Neutro Abs: 9.4 10*3/uL — ABNORMAL HIGH (ref 1.7–7.7)
Platelets: 260 10*3/uL (ref 150–400)
RBC: 4.71 MIL/uL (ref 3.87–5.11)
RDW: 13.2 % (ref 11.5–15.5)
WBC: 13 10*3/uL — ABNORMAL HIGH (ref 4.0–10.5)

## 2015-02-09 LAB — MAGNESIUM: MAGNESIUM: 1.9 mg/dL (ref 1.5–2.5)

## 2015-02-09 MED ORDER — SODIUM CHLORIDE 0.9 % IV BOLUS (SEPSIS)
1000.0000 mL | Freq: Once | INTRAVENOUS | Status: AC
  Administered 2015-02-09: 1000 mL via INTRAVENOUS

## 2015-02-09 MED ORDER — SODIUM CHLORIDE 0.9 % IV SOLN
INTRAVENOUS | Status: DC
  Administered 2015-02-09 (×2): via INTRAVENOUS

## 2015-02-09 NOTE — Progress Notes (Signed)
Utilization review completed.  

## 2015-02-09 NOTE — Progress Notes (Signed)
Insulin gtt off at midnight. Pt was able to tolerate both solid food and liquids overnight. No episodes of N/V/D since admission. VSS on RA.

## 2015-02-09 NOTE — Progress Notes (Signed)
[facility] PROGRESS NOTE  Kerry Compton ZOX:[id number] DOB: Jun 21, 1956 DOA: 02/08/2015 PCP: Cala BradfordWHITE,[name], MD  Assessment/Plan: #1 diabetic ketoacidosis/DM type I Likely triggered by viral gastroenteritis. C. difficile PCR pending. Patient with no further nausea vomiting or diarrhea. Patient with clinical improvement. Anion gap was closed and patient was transitioned to subcutaneous insulin. Basic metabolic profile still with a bicarbonate of 15 and a gap of 10 which was drawn early this morning around 3:50 AM. Will check a stat BMET. If anion gap is elevated all bicarbonate still low we'll place back on the insulin drip. Check a hemoglobin A1c. Currently on Lantus and sliding scale insulin. Follow.  #2 hyperlipidemia Lipitor on hold. Will resume when tolerating oral intake.  #3 chronic asthma Stable. Bronchodilators as needed.  #4 hypertension Blood pressure is borderline. Continue to hold BP meds.  #5 gastroenteritis and colitis, viral/nausea vomiting diarrhea Likely a viral gastroenteritis. C. difficile PCR pending. Patient with sick contacts prior to admission. Will repeat a CBC this morning as patient was noted to have a leukocytosis on admission. Follow.  #6 acute renal failure Likely secondary to prerenal azotemia secondary to volume depletion in the setting of ARB. There are be on hold. Renal function improved with hydration. Follow.  #7 leukocytosis Likely reactive leukocytosis. CBC pending. C. difficile PCR pending. Will check a UA with cultures and sensitivities. Check a chest x-ray if WBC still elevated and not trending down. Follow.  #8 prophylaxis Heparin for DVT prophylaxis.  Code Status: Full Family Communication: Updated patient no family present. Disposition Plan: Remain in step down unit.   Consultants:  None  Procedures:  None  Antibiotics:  None  HPI/Subjective: Patient denies any further nausea or vomiting. Patient denies any  diarrhea. Patient status was able to tolerate soup last night. No complaints.  Objective: Filed Vitals:   02/09/15 0800  BP: 106/47  Pulse: 91  Temp:   Resp: 17    Intake/Output Summary (Last 24 hours) at 02/09/15 0823 Last data filed at 02/09/15 0800  Gross per 24 hour  Intake 2521.77 ml  Output   2900 ml  Net -378.23 ml   Filed Weights   02/08/15 1255  Weight: 82.5 kg (181 lb 14.1 oz)    Exam:   General:  NAD  Cardiovascular: RRR  Respiratory: CTAB  Abdomen: Soft/NT/ND/+BS  Musculoskeletal: No c/c/e  Data Reviewed: Basic Metabolic Panel:  Recent Labs Lab 02/08/15 0820 02/08/15 1632 02/08/15 2007 02/09/15 0005 02/09/15 0350  NA 140 139 137 134* 133*  K 5.0 4.4 4.8 4.2 4.1  CL 107 115* 114* 110 108  CO2 13* 14* 13* 17* 15*  GLUCOSE 331* 105* 180* 279* 196*  BUN 28* 28* 26* 27* 25*  CREATININE 1.20* 0.95 0.94 0.93 0.84  CALCIUM 9.8 8.6 8.5 8.6 8.3*   Liver Function Tests:  Recent Labs Lab 02/08/15 0820  AST 33  ALT 36*  ALKPHOS 114  BILITOT 2.1*  PROT 7.2  ALBUMIN 4.5   No results for input(s): LIPASE, AMYLASE in the last 168 hours. No results for input(s): AMMONIA in the last 168 hours. CBC:  Recent Labs Lab 02/08/15 0820  WBC 20.0*  NEUTROABS 18.1*  HGB 15.1*  HCT 46.6*  MCV 88.3  PLT 342   Cardiac Enzymes: No results for input(s): CKTOTAL, CKMB, CKMBINDEX, TROPONINI in the last 168 hours. BNP (last 3 results) No results for input(s): BNP in the last 8760 hours.  ProBNP (last 3 results) No results for input(s): PROBNP in the  last 8760 hours.  CBG:  Recent Labs Lab 02/08/15 1539 02/08/15 1638 02/08/15 1725 02/08/15 1816 02/08/15 1936  GLUCAP 107* 93 85 116* 146*    Recent Results (from the past 240 hour(s))  MRSA PCR Screening     Status: None   Collection Time: 02/08/15 12:57 PM  Result Value Ref Range Status   MRSA by PCR NEGATIVE NEGATIVE Final    Comment:        The GeneXpert MRSA Assay (FDA approved for  NASAL specimens only), is one component of a comprehensive MRSA colonization surveillance program. It is not intended to diagnose MRSA infection nor to guide or monitor treatment for MRSA infections.      Studies: No results found.  Scheduled Meds: . atorvastatin  80 mg Oral q1800  . heparin  5,000 Units Subcutaneous 3 times per day  . insulin aspart  0-20 Units Subcutaneous TID WC  . insulin aspart  0-5 Units Subcutaneous QHS  . insulin glargine  20 Units Subcutaneous QHS  . loratadine  10 mg Oral Daily  . [COMPLETED] sodium chloride  1,000 mL Intravenous Once   Continuous Infusions: . sodium chloride 125 mL/hr at 02/09/15 0600    Principal Problem:   DKA (diabetic ketoacidoses) Active Problems:   Type 1 diabetes mellitus   Hyperlipidemia   Asthma, chronic   Hypertension   Gastroenteritis and colitis, viral   AKI (acute kidney injury)   Nausea vomiting and diarrhea   Leukocytosis    Time spent: 40 mins    [facility] [address] MD [facility] Pager [phone]. If 7PM-7AM, please contact night-coverage at [email or web address], password [facility] 02/09/2015, 8:23 AM  LOS: 1 day

## 2015-02-10 DIAGNOSIS — E0810 Diabetes mellitus due to underlying condition with ketoacidosis without coma: Secondary | ICD-10-CM | POA: Insufficient documentation

## 2015-02-10 LAB — BASIC METABOLIC PANEL
Anion gap: 10 (ref 5–15)
BUN: 16 mg/dL (ref 6–23)
CO2: 20 mmol/L (ref 19–32)
Calcium: 8.4 mg/dL (ref 8.4–10.5)
Chloride: 110 mmol/L (ref 96–112)
Creatinine, Ser: 0.53 mg/dL (ref 0.50–1.10)
GFR calc Af Amer: >90 mL/min (ref >90)
GLUCOSE: 159 mg/dL — AB (ref 70–99)
POTASSIUM: 3.8 mmol/L (ref 3.5–5.1)
Sodium: 140 mmol/L (ref 135–145)

## 2015-02-10 LAB — CBC WITH DIFFERENTIAL/PLATELET
Basophils Absolute: 0 10*3/uL (ref 0.0–0.1)
Basophils Relative: 1 % (ref 0–1)
EOS ABS: 0.2 10*3/uL (ref 0.0–0.7)
Eosinophils Relative: 4 % (ref 0–5)
HEMATOCRIT: 35.2 % — AB (ref 36.0–46.0)
Hemoglobin: 11.5 g/dL — ABNORMAL LOW (ref 12.0–15.0)
Lymphocytes Relative: 32 % (ref 12–46)
Lymphs Abs: 1.8 10*3/uL (ref 0.7–4.0)
MCH: 28 pg (ref 26.0–34.0)
MCHC: 32.7 g/dL (ref 30.0–36.0)
MCV: 85.9 fL (ref 78.0–100.0)
MONO ABS: 0.5 10*3/uL (ref 0.1–1.0)
MONOS PCT: 8 % (ref 3–12)
Neutro Abs: 3.1 10*3/uL (ref 1.7–7.7)
Neutrophils Relative %: 55 % (ref 43–77)
Platelets: 181 10*3/uL (ref 150–400)
RBC: 4.1 MIL/uL (ref 3.87–5.11)
RDW: 13.2 % (ref 11.5–15.5)
WBC: 5.6 10*3/uL (ref 4.0–10.5)

## 2015-02-10 LAB — HEMOGLOBIN A1C
HEMOGLOBIN A1C: 7.6 % — AB (ref 4.8–5.6)
Mean Plasma Glucose: 171 mg/dL

## 2015-02-10 LAB — GLUCOSE, CAPILLARY: Glucose-Capillary: 144 mg/dL — ABNORMAL HIGH (ref 70–99)

## 2015-02-10 NOTE — Care Management Note (Signed)
    Page 1 of 1   02/10/2015     12:38:41 PM CARE MANAGEMENT NOTE 02/10/2015  Patient:  Kerry Compton,Kerry Compton   Account Number:  [id number]  Date Initiated:  02/10/2015  Documentation initiated by:  Lanier ClamMAHABIR,Nery Kalisz  Subjective/Objective Assessment:   59 y/o f admitted w/DKA.     Action/Plan:   From home.   Anticipated DC Date:  02/10/2015   Anticipated DC Plan:  HOME/SELF CARE      DC Planning Services  CM consult      Choice offered to / List presented to:             Status of service:  Completed, signed off Medicare Important Message given?   (If response is "NO", the following Medicare IM given date fields will be blank) Date Medicare IM given:   Medicare IM given by:   Date Additional Medicare IM given:   Additional Medicare IM given by:    Discharge Disposition:  HOME/SELF CARE  Per UR Regulation:  Reviewed for med. necessity/level of care/duration of stay  If discussed at Long Length of Stay Meetings, dates discussed:    Comments:  02/10/15 Lanier ClamKathy Jovin Fester RN BSN NCM [phone] d/c home no needs or orders.

## 2015-02-10 NOTE — Discharge Summary (Signed)
Physician Discharge Summary  CHRISTEEN LAI ZOX:[id number] DOB: [birth year] DOA: 02/08/2015  PCP: Cala Bradford, MD  Admit date: 02/08/2015 Discharge date: 02/10/2015  Time spent: 65 minutes  Recommendations for Outpatient Follow-up:  1. Follow-up with Cala Bradford, MD in 1 week. On follow-up patient will need a basic metabolic profile done to follow-up on electrolytes and renal function. Hemoglobin A1c which was obtained during the hospitalization and pending at time of discharge when he to be followed up upon. 2. Follow up with Dr. Sharl Ma of endocrinology as previously scheduled.  Discharge Diagnoses:  Principal Problem:   DKA (diabetic ketoacidoses) Active Problems:   Type 1 diabetes mellitus   Hyperlipidemia   Asthma, chronic   Hypertension   Gastroenteritis and colitis, viral   AKI (acute kidney injury)   Nausea vomiting and diarrhea   Leukocytosis   Acute renal failure syndrome   Discharge Condition: Stable and improved  Diet recommendation: Carb modified.  Filed Weights   02/08/15 1255  Weight: 82.5 kg (181 lb 14.1 oz)    History of present illness:  Per Dr Trula Ore Rama Kerry Compton is an 59 y.o. female with a PMH of insulin dependent DM who reported 3 episodes of vomiting and 6 episodes of diarrhea over the past 24 hours prior to admission. Family members recently had a GI illness. She treated herself with Alka-Seltzer the night prior to admission, without relief. She did give herself extra insulin 11 PM the night prior to admission, because her sugars were running high (in the 200's although she could not remember exactly). She told admitting MD, that Dr. Sharl Ma checks her hemoglobin A1c every 3 months, and that her average blood sugar is around 154. She was excessively thirsty with excessive urination through the night. She had abdominal pain, but it had eased off after being given anti-emetics. Upon initial evaluation in the ED, the patient was  found to be in DKA with a glucose of 331 and a bicarb of 13 (anion gap 20).   Hospital Course:  #1 diabetic ketoacidosis/DM type I Likely triggered by viral gastroenteritis. Patient was admitted placed in the step down unit and started on the glucose stabilizer. C. difficile PCR was obtained which was negative. Patient was hydrated aggressively with IV fluids. Patient's insulin pump was discontinued during the hospitalization. Patient improved clinically. Patient's anion gap closed and her bicarbonate levels improved. Patient was subsequently transitioned to subcutaneous insulin and placed on a sliding scale insulin. Patient's diet was advanced and patient was tolerating a car modified diet by day of discharge. Patient did not have any further nausea vomiting abdominal pain or diarrhea. Hemoglobin A1c was ordered however was pending at the time of discharge and need to be followed up upon. Patient improved clinically and was back to baseline by day of discharge. Patient will be discharged home back on her insulin pump and is to follow-up with her PCP and endocrinologist as an outpatient.  #2 hyperlipidemia Lipitor was continued during the hospitalization after patient was tolerating oral intake.   #3 chronic asthma Stable. Bronchodilators as needed.  #4 hypertension Blood pressure was borderline and a such patient's blood pressure medications were held. Patient's blood pressure regimen will be resumed on discharge.   #5 gastroenteritis and colitis, viral/nausea vomiting diarrhea Likely a viral gastroenteritis. C. difficile PCR was negative. Patient with sick contacts prior to admission. Patient improved clinically did not have any further nausea or vomiting or diarrhea for the rest of the hospitalization. Patient was tolerating good  oral intake. Patient will be discharged in stable and improved condition.   #6 acute renal failure Likely secondary to prerenal azotemia secondary to volume depletion  in the setting of ARB. Patient's ARB was felt patient was hydrated with IV fluids with resolution of her acute renal failure day of discharge.   #7 leukocytosis Likely reactive leukocytosis. Urinalysis obtained was negative. Patient did not have any respiratory symptoms. C. difficile PCR which was obtained was negative. Leukocytosis trended down and had resolved by day of discharge.   Procedures: None  Consultations:  None  Discharge Exam: Filed Vitals:   02/10/15 0804  BP: 142/71  Pulse: 83  Temp: 98 F (36.7 C)  Resp:     General: NAD Cardiovascular: RRR Respiratory: CTAB  Discharge Instructions   Discharge Instructions    Diet Carb Modified    Complete by:  As directed      Discharge instructions    Complete by:  As directed   Follow up with Cala BradfordWHITE,[name], MD in 1 week. Follow up with Dr Sharl MaKerr as scheduled.     Increase activity slowly    Complete by:  As directed           Current Discharge Medication List    CONTINUE these medications which have NOT CHANGED   Details  albuterol (PROVENTIL HFA;VENTOLIN HFA) 108 (90 BASE) MCG/ACT inhaler Inhale into the lungs every 6 (six) hours as needed for wheezing or shortness of breath.    atorvastatin (LIPITOR) 80 MG tablet Take 80 mg by mouth daily.      Canagliflozin (INVOKANA) 100 MG TABS Take 100 mg by mouth daily.     Insulin Human (INSULIN PUMP) 100 unit/ml SOLN Inject into the skin continuous. Novolog insulin pump basal rate 1200 am to 300 am = 1.0 600 am to 600 pm = 0.90    loratadine (CLARITIN) 10 MG tablet Take 10 mg by mouth daily.      losartan (COZAAR) 50 MG tablet Take 50 mg by mouth every morning.    metFORMIN (GLUCOPHAGE) 500 MG tablet Take 500 mg by mouth daily.     Multiple Vitamin (MULTIVITAMIN WITH MINERALS) TABS tablet Take 1 tablet by mouth daily.       Allergies  Allergen Reactions  . Azithromycin Diarrhea and Nausea And Vomiting   Follow-up Information    Follow up with  [name],[name], MD. Schedule an appointment as soon as possible for a visit in 1 week.   Specialty:  Family Medicine   Contact information:   [address], [address] [place] [address] [phone]       Follow up with Talmage CoinKERR,JEFFREY, MD.   Specialty:  Endocrinology   Why:  f/u as scheduled   Contact information:   [address] [place] [address] [phone]        The results of significant diagnostics from this hospitalization (including imaging, microbiology, ancillary and laboratory) are listed below for reference.    Significant Diagnostic Studies: No results found.  Microbiology: Recent Results (from the past 240 hour(s))  MRSA PCR Screening     Status: None   Collection Time: 02/08/15 12:57 PM  Result Value Ref Range Status   MRSA by PCR NEGATIVE NEGATIVE Final    Comment:        The GeneXpert MRSA Assay (FDA approved for NASAL specimens only), is one component of a comprehensive MRSA colonization surveillance program. It is not intended to diagnose MRSA infection nor to  guide or monitor treatment for MRSA infections.   Clostridium Difficile by PCR     Status: None   Collection Time: 02/09/15 11:21 AM  Result Value Ref Range Status   C difficile by pcr NEGATIVE NEGATIVE Final    Comment: Performed at [facility]     Labs: Basic Metabolic Panel:  Recent Labs Lab 02/08/15 2007 02/09/15 0005 02/09/15 0350 02/09/15 0840 02/10/15 0609  NA 137 134* 133* 138 140  K 4.8 4.2 4.1 3.7 3.8  CL 114* 110 108 110 110  CO2 13* 17* 15* 19 20  GLUCOSE 180* 279* 196* 116* 159*  BUN 26* 27* 25* 23 16  CREATININE 0.94 0.93 0.84 0.77 0.53  CALCIUM 8.5 8.6 8.3* 8.8 8.4  MG  --   --   --  1.9  --    Liver Function Tests:  Recent Labs Lab 02/08/15 0820  AST 33  ALT 36*  ALKPHOS 114  BILITOT 2.1*  PROT 7.2  ALBUMIN 4.5   No results for input(s): LIPASE, AMYLASE in the last 168 hours. No results for input(s): AMMONIA in  the last 168 hours. CBC:  Recent Labs Lab 02/08/15 0820 02/09/15 0840 02/10/15 0609  WBC 20.0* 13.0* 5.6  NEUTROABS 18.1* 9.4* 3.1  HGB 15.1* 13.6 11.5*  HCT 46.6* 41.0 35.2*  MCV 88.3 87.0 85.9  PLT 342 260 181   Cardiac Enzymes: No results for input(s): CKTOTAL, CKMB, CKMBINDEX, TROPONINI in the last 168 hours. BNP: BNP (last 3 results) No results for input(s): BNP in the last 8760 hours.  ProBNP (last 3 results) No results for input(s): PROBNP in the last 8760 hours.  CBG:  Recent Labs Lab 02/09/15 0816 02/09/15 1218 02/09/15 1700 02/09/15 2135 02/10/15 0735  GLUCAP 113* 165* 181* 214* 144*       Signed:  Willia Genrich MD [facility] 02/10/2015, 10:03 AM

## 2015-02-10 NOTE — Progress Notes (Signed)
Inpatient Diabetes Program Recommendations  AACE/ADA: New Consensus Statement on Inpatient Glycemic Control (2013)  Target Ranges:  Prepandial:   less than 140 mg/dL      Peak postprandial:   less than 180 mg/dL (1-2 hours)      Critically ill patients:  140 - 180 mg/dL     Results for Kerry Compton, [name] (MRN [id number]) as of 02/10/2015 10:21  Ref. Range 02/09/2015 08:16 02/09/2015 12:18 02/09/2015 17:00 02/09/2015 21:35  Glucose-Capillary Latest Range: 70-99 mg/dL 409113 (H) 811165 (H) 914181 (H) 214 (H)     Chief Complaint: DKA  History: Diabetes (managed by Dr. Talmage CoinJeffrey Kerr)  Home DM Meds: Insulin Pump  Current DM Orders: Lantus 20 units QHS    Novolog Resistant SSI tid ac + HS   **Patient transitioned off IV insulin drip ~9pm on 02/27.  Was given 20 units Lantus to come off IV insulin drip.  **Patient was given 20 units Lantus last PM at ~11:30pm.  I was called by Dr. Janee Mornhompson this AM to come see patient.  Dr. Janee Mornhompson plans to send patient home today and would like patient to resume her insulin pump.  **Spoke with patient.  Upon review of insulin pump, patient receives 22.2 units basal insulin Q24 hours on her pump.  Since patient was given 20 units Lantus last night at 11:30 pm, patient should not resume her basal rates until 10pm tonight (02/29).  Since patient was given Novolog this AM and Lantus last PM, patient can wait until she gets home today to resume her insulin pump.  Counseled patient to resume her insulin pump when she gets home with all new supplies and new insulin (new insulin, new reservoir, new tubing, new insertion site, etc).  Since no insulin has been infusing through the insertion site, her current insertion site may not be patent.  Also, counseled patient to NOT resume her basal rates until 10pm tonight.  Explained to patient that since she has Lantus on board, this Lantus will last until ~11pm tonight and we do not want her basal rates and the Lantus to overlap and  potentially cause hypoglycemia.  Patient instructed to resume her bolus features on her pump as soon as she gets home but to not resume her basal rates until 10pm tonight.  Patient stated she understood and could do this at home.  **Patient to follow up with Dr. Sharl MaKerr on 03/05/15.    Will follow Ambrose FinlandJeannine Johnston Kasha Howeth RN, MSN, CDE Diabetes Coordinator Inpatient Diabetes Program Team Pager: [phone] (8a-10p)

## 2015-05-07 ENCOUNTER — Other Ambulatory Visit (HOSPITAL_COMMUNITY)
Admission: RE | Admit: 2015-05-07 | Discharge: 2015-05-07 | Disposition: A | Discharge status: Home / Self Care | Payer: BLUE CROSS/BLUE SHIELD | Source: Ambulatory Visit | Attending: Family Medicine | Admitting: Family Medicine

## 2015-05-07 ENCOUNTER — Other Ambulatory Visit: Payer: Self-pay | Admitting: Family Medicine

## 2015-05-07 DIAGNOSIS — Z01419 Encounter for gynecological examination (general) (routine) without abnormal findings: Secondary | ICD-10-CM | POA: Insufficient documentation

## 2015-05-07 DIAGNOSIS — Z1151 Encounter for screening for human papillomavirus (HPV): Secondary | ICD-10-CM | POA: Diagnosis present

## 2015-05-08 LAB — CYTOLOGY - PAP

## 2015-06-09 ENCOUNTER — Other Ambulatory Visit: Payer: Self-pay

## 2015-08-27 ENCOUNTER — Encounter (HOSPITAL_BASED_OUTPATIENT_CLINIC_OR_DEPARTMENT_OTHER): Payer: Self-pay

## 2015-08-27 ENCOUNTER — Inpatient Hospital Stay (HOSPITAL_BASED_OUTPATIENT_CLINIC_OR_DEPARTMENT_OTHER)
Admission: EM | Admit: 2015-08-27 | Discharge: 2015-08-29 | DRG: 638 | Disposition: A | Discharge status: Home / Self Care | Payer: BLUE CROSS/BLUE SHIELD | Attending: Internal Medicine | Admitting: Internal Medicine

## 2015-08-27 DIAGNOSIS — Z9641 Presence of insulin pump (external) (internal): Secondary | ICD-10-CM | POA: Diagnosis present

## 2015-08-27 DIAGNOSIS — R197 Diarrhea, unspecified: Secondary | ICD-10-CM | POA: Diagnosis present

## 2015-08-27 DIAGNOSIS — E1010 Type 1 diabetes mellitus with ketoacidosis without coma: Secondary | ICD-10-CM | POA: Diagnosis not present

## 2015-08-27 DIAGNOSIS — E860 Dehydration: Secondary | ICD-10-CM | POA: Diagnosis present

## 2015-08-27 DIAGNOSIS — E109 Type 1 diabetes mellitus without complications: Secondary | ICD-10-CM | POA: Diagnosis present

## 2015-08-27 DIAGNOSIS — I10 Essential (primary) hypertension: Secondary | ICD-10-CM | POA: Diagnosis not present

## 2015-08-27 DIAGNOSIS — Z794 Long term (current) use of insulin: Secondary | ICD-10-CM

## 2015-08-27 DIAGNOSIS — E875 Hyperkalemia: Secondary | ICD-10-CM | POA: Diagnosis not present

## 2015-08-27 DIAGNOSIS — E1110 Type 2 diabetes mellitus with ketoacidosis without coma: Secondary | ICD-10-CM | POA: Diagnosis present

## 2015-08-27 DIAGNOSIS — R112 Nausea with vomiting, unspecified: Secondary | ICD-10-CM | POA: Diagnosis present

## 2015-08-27 DIAGNOSIS — J4540 Moderate persistent asthma, uncomplicated: Secondary | ICD-10-CM | POA: Diagnosis not present

## 2015-08-27 DIAGNOSIS — D72829 Elevated white blood cell count, unspecified: Secondary | ICD-10-CM | POA: Diagnosis present

## 2015-08-27 DIAGNOSIS — N179 Acute kidney failure, unspecified: Secondary | ICD-10-CM | POA: Diagnosis present

## 2015-08-27 DIAGNOSIS — J45909 Unspecified asthma, uncomplicated: Secondary | ICD-10-CM | POA: Diagnosis present

## 2015-08-27 LAB — BASIC METABOLIC PANEL
ANION GAP: 16 — AB (ref 5–15)
BUN: 24 mg/dL — ABNORMAL HIGH (ref 6–20)
CALCIUM: 9.4 mg/dL (ref 8.9–10.3)
CO2: 18 mmol/L — AB (ref 22–32)
Chloride: 97 mmol/L — ABNORMAL LOW (ref 101–111)
Creatinine, Ser: 1.18 mg/dL — ABNORMAL HIGH (ref 0.44–1.00)
GFR calc Af Amer: 58 mL/min — ABNORMAL LOW (ref >60)
GFR calc non Af Amer: 50 mL/min — ABNORMAL LOW (ref >60)
GLUCOSE: 686 mg/dL — AB (ref 65–99)
Potassium: 5.2 mmol/L — ABNORMAL HIGH (ref 3.5–5.1)
Sodium: 131 mmol/L — ABNORMAL LOW (ref 135–145)

## 2015-08-27 LAB — URINALYSIS, ROUTINE W REFLEX MICROSCOPIC
Bilirubin Urine: NEGATIVE
Glucose, UA: >1000 mg/dL — AB
HGB URINE DIPSTICK: NEGATIVE
Ketones, ur: >80 mg/dL — AB
Leukocytes, UA: NEGATIVE
Nitrite: NEGATIVE
PH: 5 (ref 5.0–8.0)
PROTEIN: NEGATIVE mg/dL
SPECIFIC GRAVITY, URINE: 1.029 (ref 1.005–1.030)
Urobilinogen, UA: 0.2 mg/dL (ref 0.0–1.0)

## 2015-08-27 LAB — I-STAT VENOUS BLOOD GAS, ED
ACID-BASE DEFICIT: 8 mmol/L — AB (ref 0.0–2.0)
BICARBONATE: 18.9 meq/L — AB (ref 20.0–24.0)
O2 SAT: 43 %
TCO2: 20 mmol/L (ref 0–100)
pCO2, Ven: 42.5 mmHg — ABNORMAL LOW (ref 45.0–50.0)
pH, Ven: 7.256 (ref 7.250–7.300)
pO2, Ven: 28 mmHg — CL (ref 30.0–45.0)

## 2015-08-27 LAB — CBG MONITORING, ED
Glucose-Capillary: >600 mg/dL (ref 65–99)
Glucose-Capillary: 515 mg/dL — ABNORMAL HIGH (ref 65–99)
Glucose-Capillary: >600 mg/dL (ref 65–99)
Glucose-Capillary: 491 mg/dL — ABNORMAL HIGH (ref 65–99)

## 2015-08-27 LAB — URINE MICROSCOPIC-ADD ON

## 2015-08-27 LAB — CBC
HCT: 44.1 % (ref 36.0–46.0)
HEMOGLOBIN: 14.4 g/dL (ref 12.0–15.0)
MCH: 28.3 pg (ref 26.0–34.0)
MCHC: 32.7 g/dL (ref 30.0–36.0)
MCV: 86.8 fL (ref 78.0–100.0)
Platelets: 284 10*3/uL (ref 150–400)
RBC: 5.08 MIL/uL (ref 3.87–5.11)
RDW: 13 % (ref 11.5–15.5)
WBC: 14 10*3/uL — ABNORMAL HIGH (ref 4.0–10.5)

## 2015-08-27 LAB — GLUCOSE, CAPILLARY: Glucose-Capillary: 439 mg/dL — ABNORMAL HIGH (ref 65–99)

## 2015-08-27 MED ORDER — INSULIN REGULAR HUMAN 100 UNIT/ML IJ SOLN
INTRAMUSCULAR | Status: DC
  Administered 2015-08-27: 4.6 [IU]/h via INTRAVENOUS

## 2015-08-27 MED ORDER — SODIUM CHLORIDE 0.9 % IV SOLN
INTRAVENOUS | Status: AC
  Administered 2015-08-27: 22:00:00 via INTRAVENOUS

## 2015-08-27 MED ORDER — SODIUM CHLORIDE 0.9 % IV BOLUS (SEPSIS)
1000.0000 mL | Freq: Once | INTRAVENOUS | Status: AC
  Administered 2015-08-27: 1000 mL via INTRAVENOUS

## 2015-08-27 MED ORDER — SODIUM CHLORIDE 0.9 % IV BOLUS (SEPSIS)
1000.0000 mL | Freq: Once | INTRAVENOUS | Status: AC
Start: 2015-08-27 — End: 2015-08-27
  Administered 2015-08-27: 1000 mL via INTRAVENOUS

## 2015-08-27 MED ORDER — DEXTROSE-NACL 5-0.45 % IV SOLN
INTRAVENOUS | Status: DC
  Administered 2015-08-28: 13:00:00 via INTRAVENOUS

## 2015-08-27 MED ORDER — ONDANSETRON HCL 4 MG/2ML IJ SOLN: 4.0000 mg | Freq: Three times a day (TID) | INTRAMUSCULAR | Status: DC | PRN

## 2015-08-27 NOTE — ED Notes (Signed)
Pt transferred via [facility] to [facility] for admission 

## 2015-08-27 NOTE — ED Notes (Signed)
C/o nausea x 2 days-was seen by PCP today-labs done-was advised via phone BS 621-pt has insulin pump

## 2015-08-27 NOTE — Progress Notes (Signed)
New Admission Note: Pt admitted to the unit from [facility] ED  Arrival Method: [facility] via stretcher Mental Orientation: Alert and Oriented x 4 Telemetry: Initiated per order Assessment: Completed Skin: Intact IV: R AC Pain: Denies Tubes: None Safety Measures: Safety Fall Prevention Plan has been discussed Admission: To be completed 6 [place] Orientation: Patient has been orientated to the room, unit and staff.  Family:   Orders have been reviewed and implemented. Will continue to monitor the patient. Call light has been placed within reach and bed alarm has been activated.   Burley Saver, RN-BC Phone: [phone]

## 2015-08-27 NOTE — ED Provider Notes (Signed)
CSN: [id number]     Arrival date & time 08/27/15  1902 History   First MD Initiated Contact with Patient 08/27/15 1927     Chief Complaint  Patient presents with  . Hyperglycemia     (Consider location/radiation/quality/duration/timing/severity/associated sxs/prior Treatment) HPI Comments: Patient with history of insulin-dependent diabetes on insulin pump presents with complaint of diarrhea, abdominal pain, nausea without vomiting over the past 2 days. Patient's blood sugar was elevated to greater than 600 this afternoon. She had labs drawn by her PCP and was told to come to the emergency department. Patient has a history of DKA in the past. She states that she feels dehydrated. She thinks that her GI "bug" has exacerbated her symptoms and made her blood sugar higher. She denies any urinary symptoms besides increased frequency. No chest pain or shortness of breath. No skin rashes. No other treatments prior to arrival. She states that her insulin pump appears to be working normally. The onset of this condition was acute. The course is constant. Aggravating factors: none. Alleviating factors: none.    Patient is a 59 y.o. female presenting with hyperglycemia. The history is provided by the patient and medical records.  Hyperglycemia Associated symptoms: abdominal pain, increased thirst, nausea and polyuria   Associated symptoms: no chest pain, no dysuria, no fever, no shortness of breath and no vomiting     Past Medical History  Diagnosis Date  . Diabetes mellitus   . Hypertension   . Hyperlipemia   . Allergy   . Asthma   . Hepatitis 1981    type a from seafood, no current liver problems   Past Surgical History  Procedure Laterality Date  . Tubal ligation    . Septoplasty    . Rotator cuff repair Right 10 yrs ago  . Colonoscopy with propofol N/A 06/18/2014    Procedure: COLONOSCOPY WITH PROPOFOL;  Surgeon: Charolett Bumpers, MD;  Location: [facility];  Service: Endoscopy;  Laterality:  N/A;   Family History  Problem Relation Age of Onset  . Diabetes Mother   . Asthma Mother   . Arthritis Mother   . Diabetes Brother   . Diabetes Maternal Grandmother   . Diabetes Brother   . Hypertension Brother   . Hyperlipidemia Brother    Social History  Substance Use Topics  . Smoking status: Never Smoker   . Smokeless tobacco: Never Used  . Alcohol Use: 0.0 oz/week    0 Standard drinks or equivalent per week     Comment: Rare   OB History    No data available     Review of Systems  Constitutional: Negative for fever.  HENT: Negative for rhinorrhea and sore throat.   Eyes: Negative for redness.  Respiratory: Negative for cough and shortness of breath.   Cardiovascular: Negative for chest pain.  Gastrointestinal: Positive for nausea, abdominal pain and diarrhea. Negative for vomiting, constipation and blood in stool.  Endocrine: Positive for polydipsia and polyuria.  Genitourinary: Positive for frequency. Negative for dysuria, vaginal bleeding and vaginal discharge.  Musculoskeletal: Negative for myalgias.  Skin: Negative for rash.  Neurological: Negative for headaches.      Allergies  Azithromycin and Indocin  Home Medications   Prior to Admission medications   Medication Sig Start Date End Date Taking? Authorizing Provider  Insulin Aspart (NOVOLOG [place]) Inject into the skin.   Yes Historical Provider, MD  insulin glargine (LANTUS) 100 UNIT/ML injection Inject into the skin at bedtime.   Yes Historical Provider, MD  albuterol (PROVENTIL HFA;VENTOLIN HFA) 108 (90 BASE) MCG/ACT inhaler Inhale into the lungs every 6 (six) hours as needed for wheezing or shortness of breath.    Historical Provider, MD  atorvastatin (LIPITOR) 80 MG tablet Take 80 mg by mouth daily.      Historical Provider, MD  Insulin Human (INSULIN PUMP) 100 unit/ml SOLN Inject into the skin continuous. Novolog insulin pump basal rate 1200 am to 300 am = 1.0 600 am to 600 pm = 0.90    Historical  Provider, MD  loratadine (CLARITIN) 10 MG tablet Take 10 mg by mouth daily.      Historical Provider, MD  losartan (COZAAR) 50 MG tablet Take 50 mg by mouth every morning.    Historical Provider, MD  metFORMIN (GLUCOPHAGE) 500 MG tablet Take 500 mg by mouth daily.     Historical Provider, MD  Multiple Vitamin (MULTIVITAMIN WITH MINERALS) TABS tablet Take 1 tablet by mouth daily.    Historical Provider, MD   BP 128/64 mmHg  Pulse 112  Temp(Src) 98 F (36.7 C) (Oral)  Resp 18  Ht 5\' 5"  (1.651 m)  Wt 189 lb (85.73 kg)  BMI 31.45 kg/m2   Physical Exam  Constitutional: She appears well-developed and well-nourished.  HENT:  Head: Normocephalic and atraumatic.  Dry oral mucosa.  Eyes: Conjunctivae are normal. Right eye exhibits no discharge. Left eye exhibits no discharge.  Neck: Normal range of motion. Neck supple.  Cardiovascular: Regular rhythm and normal heart sounds.  Tachycardia present.   Pulmonary/Chest: Effort normal and breath sounds normal. No respiratory distress. She has no wheezes. She has no rales.  Abdominal: Soft. There is tenderness (Mild generalized). There is no rebound and no guarding.  Neurological: She is alert.  Skin: Skin is warm and dry.  Psychiatric: She has a normal mood and affect.  Nursing note and vitals reviewed.   ED Course  Procedures (including critical care time) Labs Review Labs Reviewed  BASIC METABOLIC PANEL - Abnormal; Notable for the following:    Sodium 131 (*)    Potassium 5.2 (*)    Chloride 97 (*)    CO2 18 (*)    Glucose, Bld 686 (*)    BUN 24 (*)    Creatinine, Ser 1.18 (*)    GFR calc non Af Amer 50 (*)    GFR calc Af Amer 58 (*)    Anion gap 16 (*)    All other components within normal limits  CBC - Abnormal; Notable for the following:    WBC 14.0 (*)    All other components within normal limits  URINALYSIS, ROUTINE W REFLEX MICROSCOPIC (NOT AT [facility]) - Abnormal; Notable for the following:    Glucose, UA >1000 (*)     Ketones, ur >80 (*)    All other components within normal limits  CBG MONITORING, ED - Abnormal; Notable for the following:    Glucose-Capillary >600 (*)    All other components within normal limits  I-STAT VENOUS BLOOD GAS, ED - Abnormal; Notable for the following:    pCO2, Ven 42.5 (*)    pO2, Ven 28.0 (*)    Bicarbonate 18.9 (*)    Acid-base deficit 8.0 (*)    All other components within normal limits  CBG MONITORING, ED - Abnormal; Notable for the following:    Glucose-Capillary >600 (*)    All other components within normal limits  CBG MONITORING, ED - Abnormal; Notable for the following:    Glucose-Capillary 515 (*)  All other components within normal limits  URINE MICROSCOPIC-ADD ON  BLOOD GAS, VENOUS    Imaging Review No results found. I have personally reviewed and evaluated these images and lab results as part of my medical decision-making.   EKG Interpretation None       8:10 PM Patient seen and examined. 2L NS ordered. Pending VBG and BMP.   Vital signs reviewed and are as follows: BP 128/64 mmHg  Pulse 112  Temp(Src) 98 F (36.7 C) (Oral)  Resp 18  Ht 5\' 5"  (1.651 m)  Wt 189 lb (85.73 kg)  BMI 31.45 kg/m2  10:16 PM Discussed labs with Dr. Donnald Garre. Patient updated. Agreeable to admission.   Patient discussed with Dr. Adela Glimpse who accepts for admit to [facility].   MDM   Final diagnoses:  Diabetic ketoacidosis without coma associated with type 1 diabetes mellitus  Diarrhea   Admit.     Renne Crigler, PA-C 08/27/15 2216  Arby Barrette, MD 08/30/15 2131

## 2015-08-28 ENCOUNTER — Encounter (HOSPITAL_COMMUNITY): Payer: Self-pay | Admitting: *Deleted

## 2015-08-28 DIAGNOSIS — J4540 Moderate persistent asthma, uncomplicated: Secondary | ICD-10-CM

## 2015-08-28 DIAGNOSIS — J45909 Unspecified asthma, uncomplicated: Secondary | ICD-10-CM | POA: Diagnosis present

## 2015-08-28 DIAGNOSIS — E1010 Type 1 diabetes mellitus with ketoacidosis without coma: Principal | ICD-10-CM

## 2015-08-28 LAB — GLUCOSE, CAPILLARY
GLUCOSE-CAPILLARY: 214 mg/dL — AB (ref 65–99)
GLUCOSE-CAPILLARY: 151 mg/dL — AB (ref 65–99)
GLUCOSE-CAPILLARY: 90 mg/dL (ref 65–99)
GLUCOSE-CAPILLARY: 80 mg/dL (ref 65–99)
GLUCOSE-CAPILLARY: 178 mg/dL — AB (ref 65–99)
GLUCOSE-CAPILLARY: 198 mg/dL — AB (ref 65–99)
GLUCOSE-CAPILLARY: 219 mg/dL — AB (ref 65–99)
GLUCOSE-CAPILLARY: 242 mg/dL — AB (ref 65–99)
GLUCOSE-CAPILLARY: 246 mg/dL — AB (ref 65–99)
GLUCOSE-CAPILLARY: 231 mg/dL — AB (ref 65–99)
GLUCOSE-CAPILLARY: 86 mg/dL (ref 65–99)
Glucose-Capillary: 334 mg/dL — ABNORMAL HIGH (ref 65–99)
Glucose-Capillary: 257 mg/dL — ABNORMAL HIGH (ref 65–99)
Glucose-Capillary: 120 mg/dL — ABNORMAL HIGH (ref 65–99)
Glucose-Capillary: 82 mg/dL (ref 65–99)
Glucose-Capillary: 124 mg/dL — ABNORMAL HIGH (ref 65–99)
Glucose-Capillary: 75 mg/dL (ref 65–99)

## 2015-08-28 LAB — BASIC METABOLIC PANEL
ANION GAP: 5 (ref 5–15)
ANION GAP: 4 — AB (ref 5–15)
Anion gap: 6 (ref 5–15)
BUN: 18 mg/dL (ref 6–20)
BUN: 15 mg/dL (ref 6–20)
BUN: 15 mg/dL (ref 6–20)
CALCIUM: 8.6 mg/dL — AB (ref 8.9–10.3)
CALCIUM: 8.6 mg/dL — AB (ref 8.9–10.3)
CALCIUM: 8.9 mg/dL (ref 8.9–10.3)
CHLORIDE: 110 mmol/L (ref 101–111)
CHLORIDE: 108 mmol/L (ref 101–111)
CHLORIDE: 112 mmol/L — AB (ref 101–111)
CO2: 21 mmol/L — AB (ref 22–32)
CO2: 24 mmol/L (ref 22–32)
CO2: 24 mmol/L (ref 22–32)
CREATININE: 0.96 mg/dL (ref 0.44–1.00)
CREATININE: 0.75 mg/dL (ref 0.44–1.00)
Creatinine, Ser: 0.84 mg/dL (ref 0.44–1.00)
GFR calc Af Amer: >60 mL/min (ref >60)
GFR calc Af Amer: >60 mL/min (ref >60)
GFR calc Af Amer: >60 mL/min (ref >60)
GFR calc non Af Amer: >60 mL/min (ref >60)
GFR calc non Af Amer: >60 mL/min (ref >60)
GFR calc non Af Amer: >60 mL/min (ref >60)
GLUCOSE: 274 mg/dL — AB (ref 65–99)
GLUCOSE: 102 mg/dL — AB (ref 65–99)
GLUCOSE: 89 mg/dL (ref 65–99)
Potassium: 3.9 mmol/L (ref 3.5–5.1)
Potassium: 3.9 mmol/L (ref 3.5–5.1)
Potassium: 3.9 mmol/L (ref 3.5–5.1)
Sodium: 137 mmol/L (ref 135–145)
Sodium: 137 mmol/L (ref 135–145)
Sodium: 140 mmol/L (ref 135–145)

## 2015-08-28 MED ORDER — INSULIN PUMP
Freq: Three times a day (TID) | SUBCUTANEOUS | Status: DC
  Administered 2015-08-28 – 2015-08-29 (×2): via SUBCUTANEOUS
  Filled 2015-08-28: qty 1

## 2015-08-28 MED ORDER — SODIUM CHLORIDE 0.9 % IV SOLN
INTRAVENOUS | Status: DC
  Filled 2015-08-28: qty 2.5

## 2015-08-28 MED ORDER — SODIUM CHLORIDE 0.9 % IV SOLN
INTRAVENOUS | Status: DC
  Administered 2015-08-28: 16:00:00 via INTRAVENOUS

## 2015-08-28 MED ORDER — SODIUM CHLORIDE 0.9 % IV SOLN: INTRAVENOUS | Status: DC

## 2015-08-28 MED ORDER — ENOXAPARIN SODIUM 30 MG/0.3ML ~~LOC~~ SOLN
30.0000 mg | SUBCUTANEOUS | Status: DC
  Administered 2015-08-28: 30 mg via SUBCUTANEOUS
  Filled 2015-08-28: qty 0.3

## 2015-08-28 MED ORDER — INSULIN PUMP
SUBCUTANEOUS | Status: DC
  Administered 2015-08-28: 16:00:00 via SUBCUTANEOUS
  Filled 2015-08-28: qty 1

## 2015-08-28 MED ORDER — ENOXAPARIN SODIUM 40 MG/0.4ML ~~LOC~~ SOLN
40.0000 mg | SUBCUTANEOUS | Status: DC
  Administered 2015-08-29: 40 mg via SUBCUTANEOUS
  Filled 2015-08-28: qty 0.4

## 2015-08-28 MED ORDER — DEXTROSE-NACL 5-0.45 % IV SOLN
INTRAVENOUS | Status: DC
  Administered 2015-08-28: 03:00:00 via INTRAVENOUS

## 2015-08-28 NOTE — Progress Notes (Signed)
[facility] PROGRESS NOTE  Kerry Compton BJY:[id number] DOB: October 09, 1956 DOA: 08/27/2015 PCP: Kerry Bradford, MD   HPI: Kerry Compton is a 59 y/o female with insulin dependent DM on insulin pump x 4 years, HTN, and asthma who presented to the [facility] ED complaining of nausea, diarrhea, abdominal pain, polyuria and polydipsia as well as critically high glucose levels which were not measurable on home glucometer. Her outpatient provider had advised her to go to the ED. Denies any malfunction in insulin pump. Nausea and diarrhea began 2 days prior, and patient reports having frequent loose stools prior to coming to the hospital. Denies any vomiting, fever, or chills. No blood in stools. States very similar events occurred back in February 2016, starting with GI upset and quickly becoming DKA, so she came to the ED as soon as she noticed symptoms.   Upon arrival to the ED, she was found to have a glucose level in the 600's, anion gap of 16, and a bicarb of 18.   Subjective: - Feeling much better today, denies N/V/D with last loose stool at 7 pm last night - Denies CP, SOB, dizziness, or blurry vision  Assessment/Plan:  DKA  - Patient started on insulin drip on admission for her DKA. - Aggressive hydration with IV fluids she was on 125 mL/h of normal saline - DKA resolved, acidosis resolved and the gap is closed. - Discontinue insulin drip, discontinue D5W infusion, restarted back on her insulin pump. - Check BMP in a.m.  Insulin dependent diabetes mellitus - Managed at home via Insulin Pump - Insulin Pump turned off - Resume when off subQ insulin - Continue monitoring regular CBGs, improving  AKI  - Patient baseline creatinine is 0.5, presented with creatinine of 1.18. - This is likely secondary to DKA, dehydration, prerenal. - On IV fluids, this is resolved. - Continue monitoring Cr  Nausea and diarrhea - Resolved, no loose stools since 7 PM last  night and no nausea today  - Appetite is good, advance diet upon switch to SubQ insulin - Zofran prn if symptoms return - Stool studies if diarrhea returns  Hyperkalemia - Resolved, most recent potassium 3.9 from this morning  Hypertension - Stable   Leukocytosis - Stress rxn vs early infection - suspect stress reaction - Patient has remained afebrile since arrival, with no obvious sources of infection - Daily CBC, close monitoring  Asthma - Duonebs prn, stable    DVT Prophylaxis: Lovenox Code Status: Full Family Communication: None Disposition Plan: Home when ready, possibly tomorrow   Consultants:  None  Procedures:  None  Antibiotics:  None  Objective: Filed Vitals:   08/28/15 1000  BP:   Pulse: 93  Temp: 98.2 F (36.8 C)  Resp: 18    Intake/Output Summary (Last 24 hours) at 08/28/15 1306 Last data filed at 08/28/15 1200  Gross per 24 hour  Intake   1720 ml  Output   1375 ml  Net    345 ml   Filed Weights   08/27/15 1921 08/27/15 2346  Weight: 85.73 kg (189 lb) 87.998 kg (194 lb)    Exam:   General:  NAD, laying comfortably in bed  HEENT: PERRL, no scleral icterus, mucous membranes dry  Cardiovascular: RRR, no M/R/G. No JVD  Respiratory: CTAB, no wheezes or crackles  Abdomen: Non tender, non distended. + BS  Musculoskeletal: Full ROM in all 4  Skin: No rashes or cyanosis   Data Reviewed: Basic Metabolic Panel:  Recent Labs Lab  08/27/15 1950 08/28/15 0231 08/28/15 0633 08/28/15 1025  NA 131* 137 137 140  K 5.2* 3.9 3.9 3.9  CL 97* 110 108 112*  CO2 18* 21* 24 24  GLUCOSE 686* 274* 102* 89  BUN 24* 18 15 15   CREATININE 1.18* 0.96 0.75 0.84  CALCIUM 9.4 8.6* 8.6* 8.9   Liver Function Tests: No results for input(s): AST, ALT, ALKPHOS, BILITOT, PROT, ALBUMIN in the last 168 hours. No results for input(s): LIPASE, AMYLASE in the last 168 hours. No results for input(s): AMMONIA in the last 168 hours. CBC:  Recent  Labs Lab 08/27/15 1950  WBC 14.0*  HGB 14.4  HCT 44.1  MCV 86.8  PLT 284   Cardiac Enzymes: No results for input(s): CKTOTAL, CKMB, CKMBINDEX, TROPONINI in the last 168 hours. BNP (last 3 results) No results for input(s): BNP in the last 8760 hours.  ProBNP (last 3 results) No results for input(s): PROBNP in the last 8760 hours.  CBG:  Recent Labs Lab 08/28/15 0659 08/28/15 0809 08/28/15 0944 08/28/15 1058 08/28/15 1158  GLUCAP 90 80 82 124* 178*    No results found for this or any previous visit (from the past 240 hour(s)).   Studies: No results found.  Scheduled Meds: . [START ON 08/29/2015] enoxaparin (LOVENOX) injection  40 mg Subcutaneous Q24H   Continuous Infusions: . sodium chloride    . dextrose 5 % and 0.45% NaCl 100 mL/hr at 08/28/15 1248  . dextrose 5 % and 0.45% NaCl 100 mL/hr at 08/28/15 0239  . insulin (NOVOLIN-R) infusion 0.5 Units/hr (08/28/15 1200)    Principal Problem:   DKA (diabetic ketoacidoses) Active Problems:   Type 1 diabetes mellitus   Hypertension   AKI (acute kidney injury)   Nausea vomiting and diarrhea   Leukocytosis   Hyperkalemia   Asthma   Time spent: [address] [place]   [facility] Pager [phone]. If 7PM-7AM, please contact night-coverage at [email or web address], password [email or web address] 08/28/2015, 1:06 PM  LOS: 1 day        Addendum  Patient seen and examined, chart and data base reviewed.  I agree with the above assessment and plan.  For full details please see Kerry Compton, Student-PA note.  I reviewed and amended the above note as appropriate.   Clint Lipps, MD [facility] Pager: [phone] 08/28/2015, 3:55 PM

## 2015-08-28 NOTE — H&P (Signed)
[facility] Admission History and Physical       Kerry Compton:[id number] DOB: [birth year] DOA: 08/27/2015  Referring physician: EDP PCP: Cala Bradford, MD  Specialists:   Chief Complaint: Critically High Glucose Level  HPI: Kerry Compton is a 59 y.o. female with Type 1 DM S/P Insulin Pump, HTN and Asthma who presented to the [facility] ED with complaints of critically high glucose levels which were not measurable on her Glucometer at home.  She had been advised to go to the ED after she called and spoke to her Endocrinologist Dr. Sharl Ma.   She reports having nausea and diarrhea x 2 days.  She reports passing 10 loose stool in 24 hours.  She denies any fever or chills.   She reports that she has not passed any more loose stools.   In the ED, she was found to have a glucose level of 621 and 686 on the serum chemistry.    She was found to have an anion gap of 16.  She was placed on the DKA Protocol and transferred to [facility] for admission.      Review of Systems:  Constitutional: No Weight Loss, No Weight Gain, Night Sweats, Fevers, Chills, Dizziness, Light Headedness, Fatigue, or Generalized Weakness HEENT: No Headaches, Difficulty Swallowing,Tooth/Dental Problems,Sore Throat,  No Sneezing, Rhinitis, Ear Ache, Nasal Congestion, or Post Nasal Drip,  Cardio-vascular:  No Chest pain, Orthopnea, PND, Edema in Lower Extremities, Anasarca, Dizziness, Palpitations  Resp: No Dyspnea, No DOE, No Productive Cough, No Non-Productive Cough, No Hemoptysis, No Wheezing.    GI: No Heartburn, Indigestion, Abdominal Pain, +Nausea, Vomiting, +Diarrhea, Constipation, Hematemesis, Hematochezia, Melena, Change in Bowel Habits,  Loss of Appetite  GU: No Dysuria, No Change in Color of Urine, No Urgency or Urinary Frequency, No Flank pain.  Musculoskeletal: No Joint Pain or Swelling, No Decreased Range of Motion, No Back Pain.  Neurologic: No Syncope, No Seizures, Muscle Weakness,  Paresthesia, Vision Disturbance or Loss, No Diplopia, No Vertigo, No Difficulty Walking,  Skin: No Rash or Lesions. Psych: No Change in Mood or Affect, No Depression or Anxiety, No Memory loss, No Confusion, or Hallucinations   Past Medical History  Diagnosis Date  . Diabetes mellitus   . Hypertension   . Hyperlipemia   . Allergy   . Asthma   . Hepatitis 1981    type a from seafood, no current liver problems     Past Surgical History  Procedure Laterality Date  . Tubal ligation    . Septoplasty    . Rotator cuff repair Right 10 yrs ago  . Colonoscopy with propofol N/A 06/18/2014    Procedure: COLONOSCOPY WITH PROPOFOL;  Surgeon: Charolett Bumpers, MD;  Location: [facility] ENDOSCOPY;  Service: Endoscopy;  Laterality: N/A;      Prior to Admission medications   Medication Sig Start Date End Date Taking? Authorizing Provider  Insulin Aspart (NOVOLOG [place]) Inject into the skin.   Yes Historical Provider, MD  insulin glargine (LANTUS) 100 UNIT/ML injection Inject into the skin at bedtime.   Yes Historical Provider, MD  albuterol (PROVENTIL HFA;VENTOLIN HFA) 108 (90 BASE) MCG/ACT inhaler Inhale into the lungs every 6 (six) hours as needed for wheezing or shortness of breath.    Historical Provider, MD  atorvastatin (LIPITOR) 80 MG tablet Take 80 mg by mouth daily.      Historical Provider, MD  Insulin Human (INSULIN PUMP) 100 unit/ml SOLN Inject into the skin continuous. Novolog insulin pump basal rate 1200  am to 300 am = 1.0 600 am to 600 pm = 0.90    Historical Provider, MD  loratadine (CLARITIN) 10 MG tablet Take 10 mg by mouth daily.      Historical Provider, MD  losartan (COZAAR) 50 MG tablet Take 50 mg by mouth every morning.    Historical Provider, MD  metFORMIN (GLUCOPHAGE) 500 MG tablet Take 500 mg by mouth daily.     Historical Provider, MD  Multiple Vitamin (MULTIVITAMIN WITH MINERALS) TABS tablet Take 1 tablet by mouth daily.    Historical Provider, MD     Allergies  Allergen  Reactions  . Azithromycin Diarrhea and Nausea And Vomiting  . Indocin [Indomethacin]     Social History:  reports that she has never smoked. She has never used smokeless tobacco. She reports that she drinks alcohol. She reports that she does not use illicit drugs.    Family History  Problem Relation Age of Onset  . Diabetes Mother   . Asthma Mother   . Arthritis Mother   . Diabetes Brother   . Diabetes Maternal Grandmother   . Diabetes Brother   . Hypertension Brother   . Hyperlipidemia Brother        Physical Exam:  GEN:  Pleasant Well Nourished and Well Developed 59 y.o. Caucasian female examined and in no acute distress; cooperative with exam Filed Vitals:   08/27/15 2200 08/27/15 2215 08/27/15 2225 08/27/15 2346  BP: 114/59 103/83 103/83 121/63  Pulse: 110 112 112 109  Temp:   98 F (36.7 C) 98.3 F (36.8 C)  TempSrc:      Resp: Height:      Weight:    87.998 kg (194 lb)  SpO2: 97% 96% 96% 96%   Blood pressure 121/63, pulse 109, temperature 98.3 F (36.8 C), temperature source Oral, resp. rate 19, height  (1.651 m), weight 87.998 kg (194 lb), SpO2 96 %. PSYCH: She is alert and oriented x4; does not appear anxious does not appear depressed; affect is normal HEENT: Normocephalic and Atraumatic, Mucous membranes pink; PERRLA; EOM intact; Fundi:  Benign;  No scleral icterus, Nares: Patent, Oropharynx: Clear, Fair Dentition,    Neck:  FROM, No Cervical Lymphadenopathy nor Thyromegaly or Carotid Bruit; No JVD; Breasts:: Not examined CHEST WALL: No tenderness CHEST: Normal respiration, clear to auscultation bilaterally HEART: Regular rate and rhythm; no murmurs rubs or gallops BACK: No kyphosis or scoliosis; No CVA tenderness ABDOMEN: Positive Bowel Sounds, Soft Non-Tender, No Rebound or Guarding; No Masses, No Organomegaly Rectal Exam: Not done EXTREMITIES: No Cyanosis, Clubbing, or Edema; No Ulcerations. Genitalia: not examined PULSES: 2+ and  symmetric SKIN: Normal hydration no rash or ulceration CNS:  Alert and Oriented x 4, No Focal Deficits Vascular: pulses palpable throughout    Labs on Admission:  Basic Metabolic Panel:  Recent Labs Lab 08/27/15 1950  NA 131*  K 5.2*  CL 97*  CO2 18*  GLUCOSE 686*  BUN 24*  CREATININE 1.18*  CALCIUM 9.4   Liver Function Tests: No results for input(s): AST, ALT, ALKPHOS, BILITOT, PROT, ALBUMIN in the last 168 hours. No results for input(s): LIPASE, AMYLASE in the last 168 hours. No results for input(s): AMMONIA in the last 168 hours. CBC:  Recent Labs Lab 08/27/15 1950  WBC 14.0*  HGB 14.4  HCT 44.1  MCV 86.8  PLT 284   Cardiac Enzymes: No results for input(s): CKTOTAL, CKMB, CKMBINDEX, TROPONINI in the last 168 hours.  BNP (  last 3 results) No results for input(s): BNP in the last 8760 hours.  ProBNP (last 3 results) No results for input(s): PROBNP in the last 8760 hours.  CBG:  Recent Labs Lab 08/27/15 1931 08/27/15 2033 08/27/15 2111 08/27/15 2218 08/27/15 2328  GLUCAP >600* >600* 515* 491* 439*    Radiological Exams on Admission: No results found.     Assessment/Plan:   59 y.o. female with  Principal Problem:   1.     DKA (diabetic ketoacidoses)   DKA Protocol   IVFs until Anion Gap Closes   Non-Caloric Clears while on Insulin Drip   Insulin Pump turned off for now  Active Problems:   2.    Type 1 diabetes mellitus- has Insulin Pump   Insulin Pump turned off   Resume when off Insulin drip Protocol     3.    AKI (acute kidney injury)- Due to #1   IVFs   Monitor BUN/Cr     4.    Nausea vomiting and diarrhea- Viral versus Gastroenteritis   Monitor Symptoms     Symptoms improving   PRN IV Zofran   Stool Studies if Diarrhea Returns     6.    Hyperkalemia   Falsely elevated due to Hyperglycemia   Monitor     7.    Hypertension   PRN IV Hydralazine while on Insulin drip     8.    Leukocytosis- Stress Rxn vs early  Infection     9.    Asthma   DUONebs PRN   10.    DVT Prophylaxis   Lovenox          Code Status:     FULL CODE   Family Communication:  No Family Present    Disposition Plan:    Inpatient  Status        Time spent: 42 Minutes      Ron Parker [facility] Pager [phone]   If 7AM -7PM Please Contact the Day Rounding Team MD for [facility]  If 7PM-7AM, Please Contact Night-Floor Coverage  [email or web address] Password [email or web address] 08/28/2015, 1:03 AM     ADDENDUM:   Patient was seen and examined on 08/28/2015

## 2015-08-28 NOTE — Progress Notes (Signed)
Called and briefly spoke with patient and nurse.  Patient currently does not have infusion set to restart insulin pump.  Requested that she have family member bring in so that insulin pump can be resumed when appropriate.    Will follow.  Thanks, Beryl Meager, RN, BC-ADM Inpatient Diabetes Coordinator Pager [phone] (8a-5p)

## 2015-08-28 NOTE — Progress Notes (Signed)
Utilization review completed. Shakeia Krus, RN, BSN. 

## 2015-08-28 NOTE — Progress Notes (Signed)
Inpatient Diabetes Program Recommendations  AACE/ADA: New Consensus Statement on Inpatient Glycemic Control (2015)  Target Ranges:  Prepandial:   less than 140 mg/dL      Peak postprandial:   less than 180 mg/dL (1-2 hours)      Critically ill patients:  140 - 180 mg/dL   Note that patient admitted with DKA.  She has insulin pump which was removed upon admission.  Her insulin pump settings are:  12a-1.0 units/hr  3a-1.0 units/hr  6a-0.9 units/hr  6p-0.9units/hr  Carbohydrate coverage:  12a- 1 unit for every 8 grams of CHO   3p- 1 unit for every 7 grams of CHO  Her correction factor is 1 unit for every 55 mg/dL with a goal blood sugar of 100 mg/dL.  Patients daughter to bring in supplies soon for insulin pump restart.  Discussed with MD, Dr. Arthor Captain.  He states he will restart insulin pump.  Recommended 1-2 hour overlap with IV insulin.  Patient will start new site.  Explained to patient that she will need to sign contract and record CHO intake and boluses for nursing.  Thanks, Beryl Meager, RN, BC-ADM Inpatient Diabetes Coordinator Pager [phone]

## 2015-08-29 DIAGNOSIS — N179 Acute kidney failure, unspecified: Secondary | ICD-10-CM

## 2015-08-29 DIAGNOSIS — E1065 Type 1 diabetes mellitus with hyperglycemia: Secondary | ICD-10-CM

## 2015-08-29 DIAGNOSIS — E875 Hyperkalemia: Secondary | ICD-10-CM

## 2015-08-29 DIAGNOSIS — R197 Diarrhea, unspecified: Secondary | ICD-10-CM

## 2015-08-29 DIAGNOSIS — D72829 Elevated white blood cell count, unspecified: Secondary | ICD-10-CM

## 2015-08-29 DIAGNOSIS — I10 Essential (primary) hypertension: Secondary | ICD-10-CM

## 2015-08-29 DIAGNOSIS — R112 Nausea with vomiting, unspecified: Secondary | ICD-10-CM

## 2015-08-29 LAB — GLUCOSE, CAPILLARY
Glucose-Capillary: 171 mg/dL — ABNORMAL HIGH (ref 65–99)
Glucose-Capillary: 162 mg/dL — ABNORMAL HIGH (ref 65–99)
Glucose-Capillary: 177 mg/dL — ABNORMAL HIGH (ref 65–99)

## 2015-08-29 LAB — BASIC METABOLIC PANEL
Anion gap: 5 (ref 5–15)
BUN: 12 mg/dL (ref 6–20)
CALCIUM: 8.5 mg/dL — AB (ref 8.9–10.3)
CHLORIDE: 111 mmol/L (ref 101–111)
CO2: 25 mmol/L (ref 22–32)
CREATININE: 0.68 mg/dL (ref 0.44–1.00)
GFR calc non Af Amer: >60 mL/min (ref >60)
Glucose, Bld: 149 mg/dL — ABNORMAL HIGH (ref 65–99)
Potassium: 4 mmol/L (ref 3.5–5.1)
SODIUM: 141 mmol/L (ref 135–145)

## 2015-08-29 NOTE — Discharge Summary (Signed)
Physician Discharge Summary  Kerry Compton:[id number] DOB: Apr 23, 1956 DOA: 08/27/2015  PCP: Cala Bradford, MD  Admit date: 08/27/2015 Discharge date: 08/29/2015  Time spent: 35 minutes  Recommendations for Outpatient Follow-up:  1. Follow-up with PCP (Dr. Cliffton Asters). At this time, patient will need a BMP to f/u on electrolytes and renal function.  2. Follow-up with endocrinology (Dr. Sharl Ma) as soon as possible to check pump settings and BS logs for optimal function   Discharge Diagnoses:  Principal Problem:   DKA (diabetic ketoacidoses) Active Problems:   Type 1 diabetes mellitus   Hypertension   AKI (acute kidney injury)   Nausea vomiting and diarrhea   Leukocytosis   Hyperkalemia   Asthma   Discharge Condition: Stable, improved. Back to baseline per patient  Diet recommendation: Carb modified diet  Filed Weights   08/27/15 1921 08/27/15 2346  Weight: 85.73 kg (189 lb) 87.998 kg (194 lb)    History of present illness:  Kerry Compton is a 59 y/o female with a PMHx of insulin dependent DM with insulin pump x 4 years who presented to the ED with a 2 day history of nausea and frequent diarrhea. States her daughter had similar issue earlier this week. She began having abdominal pain, polyuria, and polydipsia and her glucometer could not register her BS at home (>600). Reports injecting 15 extra units of subQ insulin given by her doctor for hyperglycemic episodes but sugars remaining at this level. Her PCP consulted her over the phone to go to the ED for evaluation. States a similar episode occurred in February of this year, resulting in admission for DKA.   Upon arrival, she was found to be in DKA with a glucose in the 600s, a bicarb of 18, and anion gap of 16.    At time   Hospital Course:   DKA - Likely resulting from dehydration via viral gastroenteritis. She was admitted to telemetry and started on an insulin drip and aggressive IV hydration. Insulin pump was  discontinued upon starting IV insulin at admission. Patient began improving clinically, anion gap closed (5 on day of dischage), and bicarb levels improved (25 on day of discharge). Once CBGs were at goal and stable, patient was switched from IV insulin back to her insulin pump. This was well tolerated and closely monitored with hourly CBGs (averaging 100-200's). Diet was advanced, and patient tolerated carb modified diet well. No further nausea, diarrhea, or abdominal pain since first night of admission. Electrolytes are stable. Potassium WNL at 4.0 day of discharge. Patient stating she is back to baseline today. Patient will be discharged home with instructions to f/u with PCP and endocrinology as outpatient, continuing on insulin pump per before.    Nausea and Diarrhea - Likely viral gastroenteritis, no loose stools since 7 PM on 08/27/2015. Nausea and abdominal pain have also resolved and have not recurred since then. Tolerating oral intake well, appetite is back to normal per patient. No vomiting throughout the entire course. Patient will be discharged in stable and improved condition.   Acute Kidney Injury - Likely secondary to dehydration and DKA. Patient was hydrated with IV fluids with resolution of acute kidney injury prior to discharge. Cr is back to around baseline (0.5) this morning at 0.68.   Leukocytosis - Likely reactive leukocytosis. Patient remained afebrile throughout course, with no respiratory or urinary symptoms. Urinalysis negative for infection. Patient to be discharged with PCP follow-up in one week.   HTN - Stable throughout hospital course, continue home  regimen of losartan with regular PCP follow-up.   Chronic Asthma Stable, no SOB noted during hospital course. Continue home regimen of albuterol with regular PCP follow-up.   Hyperlipidemia Stable, continue home atorvastatin with regular PCP follow-up.      Procedures:  None  Consultations:  None  Discharge  Exam: Filed Vitals:   08/29/15 0500  BP: 133/77  Pulse: 77  Temp: 98 F (36.7 C)  Resp: 18     General: NAD, pleasant, walking around room Cardiovascular: RRR, no M/R/G Respiratory: CTAB, no wheezes or crackles  Discharge Instructions   Discharge Instructions    Diet - low sodium heart healthy    Complete by:  As directed      Increase activity slowly    Complete by:  As directed           Current Discharge Medication List    CONTINUE these medications which have NOT CHANGED   Details  albuterol (PROVENTIL HFA;VENTOLIN HFA) 108 (90 BASE) MCG/ACT inhaler Inhale into the lungs every 6 (six) hours as needed for wheezing or shortness of breath.    atorvastatin (LIPITOR) 80 MG tablet Take 80 mg by mouth daily.      insulin glargine (LANTUS) 100 UNIT/ML injection Inject 15 Units into the skin daily as needed (if bs is too high).     Insulin Human (INSULIN PUMP) 100 unit/ml SOLN Inject into the skin continuous. Novolog insulin pump basal rate 1200 am to 300 am = 1.0 600 am to 600 pm = 0.90    loratadine (CLARITIN) 10 MG tablet Take 10 mg by mouth daily.      losartan (COZAAR) 50 MG tablet Take 50 mg by mouth every morning.    metFORMIN (GLUCOPHAGE) 500 MG tablet Take 500 mg by mouth daily.     Multiple Vitamin (MULTIVITAMIN WITH MINERALS) TABS tablet Take 1 tablet by mouth daily.    NOVOLOG 100 UNIT/ML injection Inject 50 Units into the skin daily. For use with insulin pump, up to 50 units [place] per day       Allergies  Allergen Reactions  . Azithromycin Diarrhea and Nausea And Vomiting  . Indocin [Indomethacin]    Follow-up Information    Follow up with Cala Bradford, MD In 1 week.   Specialty:  Family Medicine   Contact information:   [phone] [address] [place] [address] [phone]       The results of significant diagnostics from this hospitalization (including imaging, microbiology, ancillary and laboratory) are listed below for reference.     Significant Diagnostic Studies: No results found.  Microbiology: No results found for this or any previous visit (from the past 240 hour(s)).   Labs: Basic Metabolic Panel:  Recent Labs Lab 08/27/15 1950 08/28/15 0231 08/28/15 0633 08/28/15 1025 08/29/15 0525  NA 131* 137 137 140 141  K 5.2* 3.9 3.9 3.9 4.0  CL 97* 110 108 112* 111  CO2 18* 21* 24 24 25   GLUCOSE 686* 274* 102* 89 149*  BUN 24* 18 15 15 12   CREATININE 1.18* 0.96 0.75 0.84 0.68  CALCIUM 9.4 8.6* 8.6* 8.9 8.5*   Liver Function Tests: No results for input(s): AST, ALT, ALKPHOS, BILITOT, PROT, ALBUMIN in the last 168 hours. No results for input(s): LIPASE, AMYLASE in the last 168 hours. No results for input(s): AMMONIA in the last 168 hours. CBC:  Recent Labs Lab 08/27/15 1950  WBC 14.0*  HGB 14.4  HCT 44.1  MCV 86.8  PLT  284   Cardiac Enzymes: No results for input(s): CKTOTAL, CKMB, CKMBINDEX, TROPONINI in the last 168 hours. BNP: BNP (last 3 results) No results for input(s): BNP in the last 8760 hours.  ProBNP (last 3 results) No results for input(s): PROBNP in the last 8760 hours.  CBG:  Recent Labs Lab 08/28/15 1803 08/28/15 2148 08/28/15 2305 08/29/15 0624 08/29/15 0829  GLUCAP 231* 75 86 171* 162*    Signed:  Roosvelt Maser, Student-PA   [facility] 08/29/2015, 10:38 AM   Addendum  Patient seen and examined, chart and data base reviewed.  I agree with the above assessment and plan.  For full details please see [name], Student-PA note.   Clint Lipps, MD [facility] Pager: [phone] 08/29/2015, 1:52 PM

## 2016-04-01 ENCOUNTER — Encounter (HOSPITAL_COMMUNITY): Payer: Self-pay | Admitting: Emergency Medicine

## 2016-04-01 ENCOUNTER — Emergency Department (HOSPITAL_COMMUNITY)
Admission: EM | Admit: 2016-04-01 | Discharge: 2016-04-01 | Disposition: A | Discharge status: Home / Self Care | Payer: BLUE CROSS/BLUE SHIELD | Attending: Emergency Medicine | Admitting: Emergency Medicine

## 2016-04-01 DIAGNOSIS — Z79899 Other long term (current) drug therapy: Secondary | ICD-10-CM | POA: Diagnosis not present

## 2016-04-01 DIAGNOSIS — R1084 Generalized abdominal pain: Secondary | ICD-10-CM | POA: Insufficient documentation

## 2016-04-01 DIAGNOSIS — E109 Type 1 diabetes mellitus without complications: Secondary | ICD-10-CM | POA: Diagnosis present

## 2016-04-01 DIAGNOSIS — I10 Essential (primary) hypertension: Secondary | ICD-10-CM | POA: Insufficient documentation

## 2016-04-01 DIAGNOSIS — R197 Diarrhea, unspecified: Secondary | ICD-10-CM | POA: Diagnosis not present

## 2016-04-01 DIAGNOSIS — E785 Hyperlipidemia, unspecified: Secondary | ICD-10-CM | POA: Insufficient documentation

## 2016-04-01 DIAGNOSIS — Z7951 Long term (current) use of inhaled steroids: Secondary | ICD-10-CM | POA: Diagnosis not present

## 2016-04-01 DIAGNOSIS — E1065 Type 1 diabetes mellitus with hyperglycemia: Secondary | ICD-10-CM | POA: Diagnosis present

## 2016-04-01 DIAGNOSIS — J45909 Unspecified asthma, uncomplicated: Secondary | ICD-10-CM | POA: Insufficient documentation

## 2016-04-01 DIAGNOSIS — Z791 Long term (current) use of non-steroidal anti-inflammatories (NSAID): Secondary | ICD-10-CM | POA: Diagnosis not present

## 2016-04-01 DIAGNOSIS — R739 Hyperglycemia, unspecified: Secondary | ICD-10-CM

## 2016-04-01 LAB — CBC WITH DIFFERENTIAL/PLATELET
BASOS ABS: 0.1 10*3/uL (ref 0.0–0.1)
Basophils Relative: 0 %
Eosinophils Absolute: 0.1 10*3/uL (ref 0.0–0.7)
Eosinophils Relative: 1 %
HEMATOCRIT: 41.6 % (ref 36.0–46.0)
Hemoglobin: 14.5 g/dL (ref 12.0–15.0)
LYMPHS PCT: 7 %
Lymphs Abs: 1 10*3/uL (ref 0.7–4.0)
MCH: 28.6 pg (ref 26.0–34.0)
MCHC: 34.9 g/dL (ref 30.0–36.0)
MCV: 82.1 fL (ref 78.0–100.0)
Monocytes Absolute: 0.4 10*3/uL (ref 0.1–1.0)
Monocytes Relative: 3 %
NEUTROS ABS: 11.8 10*3/uL — AB (ref 1.7–7.7)
NEUTROS PCT: 89 %
PLATELETS: 246 10*3/uL (ref 150–400)
RBC: 5.07 MIL/uL (ref 3.87–5.11)
RDW: 12.7 % (ref 11.5–15.5)
WBC: 13.4 10*3/uL — AB (ref 4.0–10.5)

## 2016-04-01 LAB — CBG MONITORING, ED
GLUCOSE-CAPILLARY: 482 mg/dL — AB (ref 65–99)
GLUCOSE-CAPILLARY: 423 mg/dL — AB (ref 65–99)
GLUCOSE-CAPILLARY: 429 mg/dL — AB (ref 65–99)
Glucose-Capillary: 413 mg/dL — ABNORMAL HIGH (ref 65–99)
Glucose-Capillary: 398 mg/dL — ABNORMAL HIGH (ref 65–99)

## 2016-04-01 LAB — URINALYSIS, ROUTINE W REFLEX MICROSCOPIC
Bilirubin Urine: NEGATIVE
HGB URINE DIPSTICK: NEGATIVE
Ketones, ur: >80 mg/dL — AB
Leukocytes, UA: NEGATIVE
Nitrite: NEGATIVE
PH: 5 (ref 5.0–8.0)
PROTEIN: NEGATIVE mg/dL
Specific Gravity, Urine: 1.036 — ABNORMAL HIGH (ref 1.005–1.030)

## 2016-04-01 LAB — COMPREHENSIVE METABOLIC PANEL
ALBUMIN: 3.9 g/dL (ref 3.5–5.0)
ALK PHOS: 92 U/L (ref 38–126)
ALT: 26 U/L (ref 14–54)
AST: 23 U/L (ref 15–41)
Anion gap: 14 (ref 5–15)
BILIRUBIN TOTAL: 1.3 mg/dL — AB (ref 0.3–1.2)
BUN: 19 mg/dL (ref 6–20)
CO2: 22 mmol/L (ref 22–32)
Calcium: 9.6 mg/dL (ref 8.9–10.3)
Chloride: 99 mmol/L — ABNORMAL LOW (ref 101–111)
Creatinine, Ser: 0.85 mg/dL (ref 0.44–1.00)
GFR calc Af Amer: >60 mL/min (ref >60)
GFR calc non Af Amer: >60 mL/min (ref >60)
GLUCOSE: 499 mg/dL — AB (ref 65–99)
POTASSIUM: 4.6 mmol/L (ref 3.5–5.1)
Sodium: 135 mmol/L (ref 135–145)
TOTAL PROTEIN: 6.6 g/dL (ref 6.5–8.1)

## 2016-04-01 LAB — URINE MICROSCOPIC-ADD ON
BACTERIA UA: NONE SEEN
RBC / HPF: NONE SEEN RBC/hpf (ref 0–5)
WBC, UA: NONE SEEN WBC/hpf (ref 0–5)

## 2016-04-01 LAB — BETA-HYDROXYBUTYRIC ACID: Beta-Hydroxybutyric Acid: 3.08 mmol/L — ABNORMAL HIGH (ref 0.05–0.27)

## 2016-04-01 MED ORDER — METOCLOPRAMIDE HCL 5 MG/ML IJ SOLN
10.0000 mg | Freq: Once | INTRAMUSCULAR | Status: AC
  Administered 2016-04-01: 10 mg via INTRAVENOUS
  Filled 2016-04-01: qty 2

## 2016-04-01 MED ORDER — SODIUM CHLORIDE 0.9 % IV SOLN
Freq: Once | INTRAVENOUS | Status: AC
  Administered 2016-04-01: 14:00:00 via INTRAVENOUS

## 2016-04-01 MED ORDER — SODIUM CHLORIDE 0.9 % IV BOLUS (SEPSIS)
1000.0000 mL | Freq: Once | INTRAVENOUS | Status: AC
  Administered 2016-04-01: 1000 mL via INTRAVENOUS

## 2016-04-01 MED ORDER — INSULIN ASPART PROT & ASPART (70-30 MIX) 100 UNIT/ML ~~LOC~~ SUSP
10.0000 [IU] | Freq: Once | SUBCUTANEOUS | Status: AC
  Administered 2016-04-01: 10 [IU] via SUBCUTANEOUS
  Filled 2016-04-01: qty 10

## 2016-04-01 NOTE — Progress Notes (Signed)
Pt confirmed her pcp C white  & endocrinologist Ester RinkJ Kerr  with CM EPIC updated

## 2016-04-01 NOTE — ED Notes (Signed)
Bed side commode placed bed side.

## 2016-04-01 NOTE — ED Notes (Signed)
Prior to discharge patient called the manufacturer for her insulin pump.  She called out to speak with the nurse.  Upon follow-up, patient showed RN her insulin pump needle.  It had been bent and was not properly in place.  Patient states, "I didn't get any of my insulin last night or today since I changed it last night."

## 2016-04-01 NOTE — Progress Notes (Signed)
Inpatient Diabetes Program Recommendations  AACE/ADA: New Consensus Statement on Inpatient Glycemic Control (2015)  Target Ranges:  Prepandial:   less than 140 mg/dL      Peak postprandial:   less than 180 mg/dL (1-2 hours)      Critically ill patients:  140 - 180 mg/dL   Review of Glycemic Control  Diabetes history: DM2 Outpatient Diabetes medications: Insulin pump, metformin 500 mg QD Current orders for Inpatient glycemic control: Insulin pump - Received 70/30 10 units for 1x dose. Note that patient has been admitted twice in the past year with DKA.  Her insulin pump settings are:  12a-1.0 units/hr 3a-1.0 units/hr 6a-0.9 units/hr 6p-0.9units/hr  Carbohydrate coverage:  12a- 1 unit for every 8 grams of CHO  3p- 1 unit for every 7 grams of CHO  Results for [name], [name] (MRN [id number]) as of 04/01/2016 14:18  Ref. Range 04/01/2016 08:58 04/01/2016 11:04 04/01/2016 11:12 04/01/2016 12:13  Glucose-Capillary Latest Ref Range: 65-99 mg/dL 482 (H) 423 (H) 429 (H) 413 (H)  Results for [name], [name] (MRN [id number]) as of 04/01/2016 14:18  Ref. Range 04/01/2016 09:13  Sodium Latest Ref Range: 135-145 mmol/L 135  Potassium Latest Ref Range: 3.5-5.1 mmol/L 4.6  Chloride Latest Ref Range: 101-111 mmol/L 99 (L)  CO2 Latest Ref Range: 22-32 mmol/L 22  BUN Latest Ref Range: 6-20 mg/dL 19  Creatinine Latest Ref Range: 0.44-1.00 mg/dL 0.85  Calcium Latest Ref Range: 8.9-10.3 mg/dL 9.6  EGFR (Non-African Amer.) Latest Ref Range: >60 mL/min >60  EGFR (African American) Latest Ref Range: >60 mL/min >60  Glucose Latest Ref Range: 65-99 mg/dL 499 (H)  Anion gap Latest Ref Range: 5-15  14    Her correction factor is 1 unit for every 55 mg/dL with a goal of 100. Pt states she has new [employer] pump at home and needs to make appt with [employer] RN for set up. Instructed pt to change insertion site and call [phone] number for safety check.  Given 70/30 10 units and blood sugar  slowly coming down. To be discharged on Lantus and Novolog (pt has prescription at home) Restart pump tomorrow at lunch since Lantus to be given today. F/U with Dr. Buddy Duty for new pump set-up.  Thank you. Kerry Compton, RD, LDN, CDE Inpatient Diabetes Coordinator [phone]

## 2016-04-01 NOTE — Discharge Instructions (Signed)
Please follow these recommendations:  - Stop pump for today - At 6pm tonight, inject 20 units of lantus prior to dinner - Can choose a new insertion site and restart pump 24 hours after lantus - If sugars continue to be high or no improvement in symptoms, call Dr. Daune PerchKerr's office or come back to the ED - Call and have medtronic remotely check pump to ensure electronic components are working - Push electrolyte rich but sugar free fluids. Can also supplement with free water.  - For diarrhea, can use barrier cream.   Hyperglycemia High blood sugar (hyperglycemia) means that the level of sugar in your blood is higher than it should be. Signs of high blood sugar include:  Feeling thirsty.  Frequent peeing (urinating).  Feeling tired or sleepy.  Dry mouth.  Vision changes.  Feeling weak.  Feeling hungry but losing weight.  Numbness and tingling in your hands or feet.  Headache. When you ignore these signs, your blood sugar may keep going up. These problems may get worse, and other problems may begin. HOME CARE  Check your blood sugars as told by your doctor. Write down the numbers with the date and time.  Take the right amount of insulin or diabetes pills at the right time. Write down the dose with date and time.  Refill your insulin or diabetes pills before running out.  Watch what you eat. Follow your meal plan.  Drink liquids without sugar, such as water. Check with your doctor if you have kidney or heart disease.  Follow your doctor's orders for exercise. Exercise at the same time of day.  Keep your doctor's appointments. GET HELP RIGHT AWAY IF:   You have trouble thinking or are confused.  You have fast breathing with fruity smelling breath.  You pass out (faint).  You have 2 to 3 days of high blood sugars and you do not know why.  You have chest pain.  You are feeling sick to your stomach (nauseous) or throwing up (vomiting).  You have sudden vision  changes. MAKE SURE YOU:   Understand these instructions.  Will watch your condition.  Will get help right away if you are not doing well or get worse.   This information is not intended to replace advice given to you by your health care provider. Make sure you discuss any questions you have with your health care provider.   Document Released: 09/26/2009 Document Revised: 12/20/2014 Document Reviewed: 08/05/2015 Elsevier Interactive Patient Education [employer]2016 Elsevier Inc.

## 2016-04-01 NOTE — ED Provider Notes (Signed)
CSN: [id number]     Arrival date & time 04/01/16  [id number] History   First MD Initiated Contact with Patient 04/01/16 [id number]     Chief Complaint  Patient presents with  . Hyperglycemia     (Consider location/radiation/quality/duration/timing/severity/associated sxs/prior Treatment) HPI 60 year old female who presents with hyperglycemia. History of diabetes with insulin pump, hypertension, and hyperlipidemia. States onset of feeling unwell this morning after waking up at 7 AM. Noticed that she was having nausea, with generalized abdominal discomfort, and 4 episodes of nonbloody diarrhea. States that she typically feels this way when she is beginning to have DKA. Subsequently came to the ED for evaluation. I did note some urinary frequency over the past day with increased thirst. No fever, chills, cough or upper respiratory symptoms, vomiting, dysuria, syncope or near syncope. Denies any recent adjustments to her insulin pump. Bolused herself this morning from her pump with insulin after noting that she was hyperglycemic. Past Medical History  Diagnosis Date  . Diabetes mellitus   . Hypertension   . Hyperlipemia   . Allergy   . Asthma   . Hepatitis 1981    type a from seafood, no current liver problems   Past Surgical History  Procedure Laterality Date  . Tubal ligation    . Septoplasty    . Rotator cuff repair Right 10 yrs ago  . Colonoscopy with propofol N/A 06/18/2014    Procedure: COLONOSCOPY WITH PROPOFOL;  Surgeon: Charolett BumpersMartin K Johnson, MD;  Location: [facility];  Service: Endoscopy;  Laterality: N/A;   Family History  Problem Relation Age of Onset  . Diabetes Mother   . Asthma Mother   . Arthritis Mother   . Diabetes Brother   . Diabetes Maternal Grandmother   . Diabetes Brother   . Hypertension Brother   . Hyperlipidemia Brother    Social History  Substance Use Topics  . Smoking status: Never Smoker   . Smokeless tobacco: Never Used  . Alcohol Use: 0.0 oz/week    0 Standard  drinks or equivalent per week     Comment: Rare   OB History    No data available     Review of Systems 10/14 systems reviewed and are negative other than those stated in the HPI    Allergies  Azithromycin and Indocin  Home Medications   Prior to Admission medications   Medication Sig Start Date End Date Taking? Authorizing Provider  albuterol (PROVENTIL HFA;VENTOLIN HFA) 108 (90 BASE) MCG/ACT inhaler Inhale into the lungs every 6 (six) hours as needed for wheezing or shortness of breath.   Yes Historical Provider, MD  atorvastatin (LIPITOR) 80 MG tablet Take 80 mg by mouth daily.     Yes Historical Provider, MD  insulin glargine (LANTUS) 100 UNIT/ML injection Inject 15 Units into the skin daily as needed (if bs is too high).    Yes Historical Provider, MD  Insulin Human (INSULIN PUMP) 100 unit/ml SOLN Inject into the skin continuous. Humalog insulin pump basal rate 1200 am to 300 am = 1.0 600 am to 600 pm = 0.90   Yes Historical Provider, MD  loratadine (CLARITIN) 10 MG tablet Take 10 mg by mouth daily.     Yes Historical Provider, MD  losartan (COZAAR) 50 MG tablet Take 50 mg by mouth every morning.   Yes Historical Provider, MD  metFORMIN (GLUCOPHAGE) 500 MG tablet Take 500 mg by mouth daily.    Yes Historical Provider, MD  Multiple Vitamin (MULTIVITAMIN WITH MINERALS) TABS tablet Take  1 tablet by mouth daily.   Yes Historical Provider, MD  naproxen sodium (ANAPROX) 220 MG tablet Take 440 mg by mouth every 12 (twelve) hours as needed (for pain).    Yes Historical Provider, MD   BP 107/58 mmHg  Pulse 106  Temp(Src) 97.8 F (36.6 C) (Oral)  Resp 18  Ht  (1.626 m)  Wt 180 lb (81.647 kg)  BMI 30.88 kg/m2  SpO2 99% Physical Exam Physical Exam  Nursing note and vitals reviewed. Constitutional: Well developed, well nourished, non-toxic, and in no acute distress Head: Normocephalic and atraumatic.  Mouth/Throat: Oropharynx is clear and mucous membranes appear dry.  Neck:  Normal range of motion. Neck supple.  Cardiovascular: Normal rate and regular rhythm.   Pulmonary/Chest: Effort normal and breath sounds normal.  Abdominal: Soft. There is no tenderness. There is no rebound and no guarding.  Musculoskeletal: Normal range of motion.  Neurological: Alert, no facial droop, fluent speech, moves all extremities symmetrically Skin: Skin is warm and dry.  Psychiatric: Cooperative  ED Course  Procedures (including critical care time) Labs Review Labs Reviewed  CBC WITH DIFFERENTIAL/PLATELET - Abnormal; Notable for the following:    WBC 13.4 (*)    Neutro Abs 11.8 (*)    All other components within normal limits  BETA-HYDROXYBUTYRIC ACID - Abnormal; Notable for the following:    Beta-Hydroxybutyric Acid 3.08 (*)    All other components within normal limits  COMPREHENSIVE METABOLIC PANEL - Abnormal; Notable for the following:    Chloride 99 (*)    Glucose, Bld 499 (*)    Total Bilirubin 1.3 (*)    All other components within normal limits  URINALYSIS, ROUTINE W REFLEX MICROSCOPIC (NOT AT [facility]) - Abnormal; Notable for the following:    Specific Gravity, Urine 1.036 (*)    Glucose, UA >1000 (*)    Ketones, ur >80 (*)    All other components within normal limits  URINE MICROSCOPIC-ADD ON - Abnormal; Notable for the following:    Squamous Epithelial / LPF 0-5 (*)    All other components within normal limits  CBG MONITORING, ED - Abnormal; Notable for the following:    Glucose-Capillary 482 (*)    All other components within normal limits  CBG MONITORING, ED - Abnormal; Notable for the following:    Glucose-Capillary 423 (*)    All other components within normal limits  CBG MONITORING, ED - Abnormal; Notable for the following:    Glucose-Capillary 429 (*)    All other components within normal limits  CBG MONITORING, ED - Abnormal; Notable for the following:    Glucose-Capillary 413 (*)    All other components within normal limits  CBG MONITORING, ED -  Abnormal; Notable for the following:    Glucose-Capillary 398 (*)    All other components within normal limits    Imaging Review No results found.   I have personally reviewed and evaluated these images and lab results as part of my medical decision-making.   EKG Interpretation   Date/Time:  Thursday April 01 2016 09:30:34 EDT Ventricular Rate:  109 PR Interval:  150 QRS Duration: 87 QT Interval:  330 QTC Calculation: 444 R Axis:   79 Text Interpretation:  Sinus tachycardia No acute ischemic changes  Confirmed by LIU MD, Annabelle Harman ([id number]) on 04/01/2016 10:03:57 AM Also confirmed  by Verdie Mosher MD, DANA [phone]), editor St[place] CT, Jola Babinski [phone])  on 04/01/2016  10:07:37 AM      MDM   Final diagnoses:  Hyperglycemia  60 year old female with history of diabetes and insulin pump who presents with hyperglycemia and diarrhea since this morning. Tachycardic on arrival, normotensive, nontoxic and in no acute distress. Does appear dry on exam. Abdomen is benign and remainder of exam unremarkable. Point-of-care glucose reveals hyperglycemia. Workup for DKA initiated. She does have hyperglycemia with glucose in the 400s. Her anion gap is borderline at 14, and she has a normal bicarbonate. Not suggestive of overt DKA. Discussed with both Dr. Vinie Sill from hospitalist service as well as the patient regarding potential admission vs discharge. Patient really preferred to potentially manage self from home and avoid admission. She initially received 2L of IVF and 10U of short acting insulin. Diabetes coordinator also saw patient and suspect pump malfunction. Later patient also noted that her needle was bent in her skin and she did not receive insulin for one day. Dr. Vinie Sill recommending that patient is to give herself 20 U of Lantus tonight with meal and start her insulin pump at new site in 24 hours. Discussed with patient that she must return to the ED and may need admission if she continues to be hyperglycemic  home or feels unwell tomorrow. She expressed understanding of all discharge instructions and felt comfortable with the plan of care.    Lavera Guise, MD 04/01/16 [phone]

## 2016-04-01 NOTE — ED Notes (Signed)
Patient comes from home with c/o N/V/D since this morning.  Patient states she thought her blood glucose was high and when she checked it at home, it was over 400.  Patient states she has had 2 similar episodes in the past year.  Patient denies pain anywhere and SOB.  Patient's abdomen soft and non-tender to palpation, +BS.

## 2016-04-01 NOTE — ED Notes (Signed)
Insulin pump suspended by the patient.  Diabetes coordinator, Bjorn LoserRhonda, contacted and she is going to come assess patient.

## 2016-04-01 NOTE — ED Notes (Signed)
Patient c/o diarrhea, hyperglycemia,  and nausea since 0700.

## 2016-04-01 NOTE — Consult Note (Addendum)
Medical Consultation   Kerry Compton  ZDG:[id number]  DOB: 25-Jun-1956  DOA: 04/01/2016  PCP: Cala Bradford, MD  Outpatient Specialists: Dr. Sharl Ma, Endocrinology   Requesting physician: Dr. Verdie Mosher  Reason for consultation: Hyperglycemia   History of Present Illness: Kerry Compton is an 60 y.o. female with PMH of DM2, HTN, HLD, asthma and allergies who presented for hyperglycemia.  She reports that she has had DKA twice in her life and they all start the same with acute diarrhea and dehydration.  She awoke this morning and had episodes of diarrhea, decreased PO intake and only able to take in a little Chai Tea.  She has had 8 total BM, non bloody and watery.  She has some chafing on her bottom.  The stool does not smell back and is brown in color.  She has also had nausea and emesis, non bloody.  She is concerned about dehydration and DKA so she came in to the ED.  Further symptoms include lethargy, dizziness.  She has not had any sick contacts, fever, chills, blurry vision, urinary changes.  She has not had any chest pain or abdominal pain.  Her glucose in the ED was in the 400s.  She uses an insulin pump and only PRN lantus at home.  She just changed the tubing and insertion site of the pump last night.  Trials of bolus of insulin X 2 were unsuccessful in the ED in lowering her blood sugar.  Attempt to lower blood sugar with SQ insulin was tried with 10 units of 70/30, repeat blood sugar was 398.  She has an AG of 14 which is verging on elevated and glucose in the urine.  She is due to get a new pump, just needs to schedule the appointment with [employer].    Discussed case with Bjorn Loser, diabetes coordinator.  If pump is malfunctioning, lantus dose would be 20 units.    Review of Systems:  Review of Systems  Constitutional: Negative for fever and chills.  HENT: Negative for hearing loss and nosebleeds.   Eyes: Positive for blurred vision. Negative for discharge and  redness.  Respiratory: Negative for cough and shortness of breath.   Cardiovascular: Negative for chest pain and palpitations.  Genitourinary: Negative for dysuria and urgency.  Musculoskeletal: Negative for myalgias and neck pain.  Skin: Negative for itching and rash.  Neurological: Positive for dizziness. Negative for sensory change.  Endo/Heme/Allergies: Negative for polydipsia.   Denies chest pain, fever, chills, ears/throat issues, dysuria, abdominal pain.  + for diarrhea, nausea, vomiting.  No swelling.  Otherwise 10 point review of systems negative.   Past Medical History: Past Medical History  Diagnosis Date  . Diabetes mellitus   . Hypertension   . Hyperlipemia   . Allergy   . Asthma   . Hepatitis 1981    type a from seafood, no current liver problems    Past Surgical History: Past Surgical History  Procedure Laterality Date  . Tubal ligation    . Septoplasty    . Rotator cuff repair Right 10 yrs ago  . Colonoscopy with propofol N/A 06/18/2014    Procedure: COLONOSCOPY WITH PROPOFOL;  Surgeon: Charolett Bumpers, MD;  Location: [facility];  Service: Endoscopy;  Laterality: N/A;   Allergies:   Allergies  Allergen Reactions  . Azithromycin Diarrhea and Nausea And Vomiting  . Indocin [Indomethacin] Other (See Comments)    Light headed  and loopy      Social History: Reports that she has never smoked.    Family History: Family History  Problem Relation Age of Onset  . Diabetes Mother   . Asthma Mother   . Arthritis Mother   . Diabetes Brother   . Diabetes Maternal Grandmother   . Diabetes Brother   . Hypertension Brother   . Hyperlipidemia Brother      Physical Exam: Filed Vitals:   04/01/16 0858 04/01/16 0930 04/01/16 1214  BP: 135/69 108/87 108/52  Pulse: 112 109 113  Temp: 97.8 F (36.6 C)    TempSrc: Oral    Resp: 20 17 16   Height: 5\' 4"  (1.626 m)    Weight: 180 lb (81.647 kg)    SpO2: 99% 96% 96%    Constitutional: Normal appearing  woman,  Alert and awake, oriented x3, not in any acute distress. Eyes: PERLA, EOMI, irises appear normal, anicteric sclera,  ENMT: external ears and nose appear normal, normal hearing Neck: neck appears normal, no masses,  CVS: S1-S2 clear, mild tachycardia, no murmur rubs or gallops, no LE edema Respiratory:  clear to auscultation bilaterally, no wheezing, rales or rhonchi. Respiratory effort normal. No accessory muscle use.  Abdomen: soft nontender, nondistended, normal bowel sounds Musculoskeletal: : no cyanosis, clubbing or edema noted bilaterally Psych: judgement and insight appear normal, stable mood and affect Skin: no rashes or lesions or ulcers, no induration or nodules   Data reviewed:  I have personally reviewed following labs and imaging studies Labs:  CBC:  Recent Labs Lab 04/01/16 0913  WBC 13.4*  NEUTROABS 11.8*  HGB 14.5  HCT 41.6  MCV 82.1  PLT 246    Basic Metabolic Panel:  Recent Labs Lab 04/01/16 0913  NA 135  K 4.6  CL 99*  CO2 22  GLUCOSE 499*  BUN 19  CREATININE 0.85  CALCIUM 9.6   GFR Estimated Creatinine Clearance: 73.7 mL/min (by C-G formula based on Cr of 0.85). Liver Function Tests:  Recent Labs Lab 04/01/16 0913  AST 23  ALT 26  ALKPHOS 92  BILITOT 1.3*  PROT 6.6  ALBUMIN 3.9   CBG:  Recent Labs Lab 04/01/16 0858 04/01/16 1104 04/01/16 1112 04/01/16 1213 04/01/16 1425  GLUCAP 482* 423* 429* 413* 398*   Urinalysis    Component Value Date/Time   COLORURINE YELLOW 04/01/2016 0914   APPEARANCEUR CLEAR 04/01/2016 0914   LABSPEC 1.036* 04/01/2016 0914   PHURINE 5.0 04/01/2016 0914   GLUCOSEU >1000* 04/01/2016 0914   HGBUR NEGATIVE 04/01/2016 0914   BILIRUBINUR NEGATIVE 04/01/2016 0914   BILIRUBINUR neg 03/19/2013 1347   KETONESUR >80* 04/01/2016 0914   PROTEINUR NEGATIVE 04/01/2016 0914   PROTEINUR neg 03/19/2013 1347   UROBILINOGEN 0.2 08/27/2015 1925   UROBILINOGEN 0.2 03/19/2013 1347   NITRITE NEGATIVE  04/01/2016 0914   NITRITE neg 03/19/2013 1347   LEUKOCYTESUR NEGATIVE 04/01/2016 0914    Inpatient Medications:   Scheduled Meds: Continuous Infusions:  Radiological Exams on Admission: No results found.  Impression/Recommendations  Type 1 diabetes mellitus with hyperglycemia - Reviewed case with Diabetes coordinator and patient.  Likely she is having malfunction of her pump (either at insertion site, or with electronic component), along with some mild dehydration.  She is alert, oriented and able to follow instructions  Plan - Stop pump for today - At 6pm tonight, inject 20 units of lantus prior to dinner - Can choose a new insertion site and restart pump 24 hours after lantus - If  sugars continue to be high or no improvement in symptoms, call Dr. Daune Perch office or come back to the ED - Call and have medtronic remotely check pump to ensure electronic components are working - Push electrolyte rich but sugar free fluids.  Can also supplement with free water.  - For diarrhea, can use barrier cream.    Again, if no improvement in next 24 hours and sugars are maintained between 400-600, would return to ED for further evaluation.   Also discussed with Dr. Verdie Mosher in the ED.    Thank you for this consultation.     Time Spent: 40 minutes  Debe Coder M.D. [facility] 04/01/2016, 2:41 PM

## 2016-07-02 DIAGNOSIS — E109 Type 1 diabetes mellitus without complications: Secondary | ICD-10-CM | POA: Diagnosis not present

## 2016-07-02 DIAGNOSIS — Z794 Long term (current) use of insulin: Secondary | ICD-10-CM | POA: Diagnosis not present

## 2016-07-02 DIAGNOSIS — E1065 Type 1 diabetes mellitus with hyperglycemia: Secondary | ICD-10-CM | POA: Diagnosis not present

## 2016-08-05 ENCOUNTER — Encounter: Payer: BLUE CROSS/BLUE SHIELD | Attending: Family Medicine | Admitting: *Deleted

## 2016-08-05 DIAGNOSIS — Z713 Dietary counseling and surveillance: Secondary | ICD-10-CM | POA: Diagnosis present

## 2016-08-05 DIAGNOSIS — E109 Type 1 diabetes mellitus without complications: Secondary | ICD-10-CM | POA: Diagnosis not present

## 2016-08-05 DIAGNOSIS — E1065 Type 1 diabetes mellitus with hyperglycemia: Secondary | ICD-10-CM

## 2016-08-05 NOTE — Patient Instructions (Signed)
Plan: Continue with your healthy eating habits, averaging 30-45 grams carbohydrate per meal Continue with your current activity level, walking your dog a couple of times each day, taking the stairs at work and other opportunities to walk during the day Consider some activities to help fill your time on the weekends and some evenings during the week to keep your mind and body active with interesting things to do; cross stitch, soft ball, perhaps kayaking or stand up paddle boarding Consider attending the Type 1 Diabetes Support Group for ongoing education and support.

## 2016-08-05 NOTE — Progress Notes (Signed)
Diabetes Self-Management Education  Visit Type: First/Initial  Appt. Start Time: 0730 Appt. End Time: 0830  08/05/2016  [name], identified by name and date of birth, is a 60 y.o. female with a diagnosis of Diabetes: Type 1.   ASSESSMENT  Height 5\' 5"  (1.651 m), weight 198 lb 8 oz (90 kg). Body mass index is 33.03 kg/m.      Diabetes Self-Management Education - 08/05/16 0742      Visit Information   Visit Type First/Initial     Initial Visit   Diabetes Type Type 1   Are you currently following a meal plan? No   Are you taking your medications as prescribed? Yes   Date Diagnosed 1987     Health Coping   How would you rate your overall health? Good     Psychosocial Assessment   Patient Belief/Attitude about Diabetes Motivated to manage diabetes   Other persons present Patient   Patient Concerns Nutrition/Meal planning;Weight Control   Special Needs None   Learning Readiness Ready   What is the last grade level you completed in school? Associates Degree     Pre-Education Assessment   Patient understands the diabetes disease and treatment process. Demonstrates understanding / competency   Patient understands incorporating nutritional management into lifestyle. Demonstrates understanding / competency   Patient undertands incorporating physical activity into lifestyle. Needs Review   Patient understands using medications safely. Demonstrates understanding / competency   Patient understands monitoring blood glucose, interpreting and using results Demonstrates understanding / competency   Patient understands prevention, detection, and treatment of acute complications. Demonstrates understanding / competency   Patient understands prevention, detection, and treatment of chronic complications. Demonstrates understanding / competency   Patient understands how to develop strategies to address psychosocial issues. Needs Review   Patient understands how to develop  strategies to promote health/change behavior. Needs Review     Complications   Last HgB A1C per patient/outside source 7.5 %   How often do you check your blood sugar? 3-4 times/day   Have you had a dilated eye exam in the past 12 months? Yes   Have you had a dental exam in the past 12 months? Yes   Are you checking your feet? Yes   How many days per week are you checking your feet? 7     Dietary Intake   Breakfast home- 2 toast OR oatmeal OR grits, on workday breakfast burrito OR every other Friday, a steak, egg and cheese biscuit   Snack (morning) only if low BG   Lunch brings from home: ready made salad (16 g. carbs) OR [employer] beef stew OR [employer] at work    K[employer] Morgan EnergySnack (afternoon) only if low BG   [employer] Southern FinancialDinner sandwiches often, deli meat, provolone cheese toasted, fresh fruit OR baked chicken, vegetables OR steak and salad bar, mashed potatoes   Snack (evening) cutie orange    Beverage(s) coffee with sweet and low, powdered cream, water     Exercise   Exercise Type Light (walking / raking leaves)  walks dogs twice a day and walks inside building at work daily, takes stairs several times a day.   How many days per week to you exercise? 5.5   How many minutes per day do you exercise? 30   Total minutes per week of exercise 165     Patient Education   Previous Diabetes Education Yes (please comment)   Nutrition management  Information on hints to eating out and maintain blood glucose  control.;Meal options for control of blood glucose level and chronic complications.   Physical activity and exercise  Role of exercise on diabetes management, blood pressure control and cardiac health.;Helped patient identify appropriate exercises in relation to his/her diabetes, diabetes complications and other health issue.   Psychosocial adjustment Helped patient identify a support system for diabetes management  Social groups for increasing activity level as well as Type 1 Support Group     Individualized  Goals (developed by patient)   Nutrition Adjust meds/carbs with exercise as discussed;General guidelines for healthy choices and portions discussed   Physical Activity Exercise 3-5 times per week   Medications take my medication as prescribed   Monitoring  test blood glucose pre and post meals as discussed     Post-Education Assessment   Patient understands the diabetes disease and treatment process. Demonstrates understanding / competency   Patient understands incorporating nutritional management into lifestyle. Demonstrates understanding / competency   Patient undertands incorporating physical activity into lifestyle. Demonstrates understanding / competency   Patient understands using medications safely. Demonstrates understanding / competency   Patient understands monitoring blood glucose, interpreting and using results Demonstrates understanding / competency   Patient understands prevention, detection, and treatment of acute complications. Demonstrates understanding / competency   Patient understands prevention, detection, and treatment of chronic complications. Demonstrates understanding / competency   Patient understands how to develop strategies to address psychosocial issues. Demonstrates understanding / competency   Patient understands how to develop strategies to promote health/change behavior. Demonstrates understanding / competency     Outcomes   Expected Outcomes Demonstrated interest in learning. Expect positive outcomes   Future DMSE PRN   Program Status Completed      Individualized Plan for Diabetes Self-Management Training:   Learning Objective:  Patient will have a greater understanding of diabetes self-management. Patient education plan is to attend individual and/or group sessions per assessed needs and concerns.   Plan:   Patient Instructions  Plan: Continue with your healthy eating habits, averaging 30-45 grams carbohydrate per meal Continue with your current  activity level, walking your dog a couple of times each day, taking the stairs at work and other opportunities to walk during the day Consider some activities to help fill your time on the weekends and some evenings during the week to keep your mind and body active with interesting things to do; cross stitch, soft ball, perhaps kayaking or stand up paddle boarding Consider attending the Type 1 Diabetes Support Group for ongoing education and support.    Expected Outcomes:  Demonstrated interest in learning. Expect positive outcomes  Education material provided: Meal plan card and Support group flyer,   If problems or questions, patient to contact team via:  Phone and Email  Future DSME appointment: PRN

## 2016-08-11 ENCOUNTER — Encounter: Payer: Self-pay | Admitting: *Deleted

## 2016-10-27 DIAGNOSIS — E1065 Type 1 diabetes mellitus with hyperglycemia: Secondary | ICD-10-CM | POA: Diagnosis not present

## 2016-11-10 DIAGNOSIS — E1065 Type 1 diabetes mellitus with hyperglycemia: Secondary | ICD-10-CM | POA: Diagnosis not present

## 2016-11-10 DIAGNOSIS — Z794 Long term (current) use of insulin: Secondary | ICD-10-CM | POA: Diagnosis not present

## 2016-11-10 DIAGNOSIS — E109 Type 1 diabetes mellitus without complications: Secondary | ICD-10-CM | POA: Diagnosis not present

## 2016-11-11 DIAGNOSIS — Z794 Long term (current) use of insulin: Secondary | ICD-10-CM | POA: Diagnosis not present

## 2016-11-11 DIAGNOSIS — E109 Type 1 diabetes mellitus without complications: Secondary | ICD-10-CM | POA: Diagnosis not present

## 2016-11-12 DIAGNOSIS — E1065 Type 1 diabetes mellitus with hyperglycemia: Secondary | ICD-10-CM | POA: Diagnosis not present

## 2016-11-12 DIAGNOSIS — E109 Type 1 diabetes mellitus without complications: Secondary | ICD-10-CM | POA: Diagnosis not present

## 2016-11-12 DIAGNOSIS — Z794 Long term (current) use of insulin: Secondary | ICD-10-CM | POA: Diagnosis not present

## 2016-12-09 DIAGNOSIS — M65332 Trigger finger, left middle finger: Secondary | ICD-10-CM | POA: Diagnosis not present

## 2016-12-31 DIAGNOSIS — M25571 Pain in right ankle and joints of right foot: Secondary | ICD-10-CM | POA: Diagnosis not present

## 2017-01-13 DIAGNOSIS — J0100 Acute maxillary sinusitis, unspecified: Secondary | ICD-10-CM | POA: Diagnosis not present

## 2017-01-31 DIAGNOSIS — E109 Type 1 diabetes mellitus without complications: Secondary | ICD-10-CM | POA: Diagnosis not present

## 2017-01-31 DIAGNOSIS — Z794 Long term (current) use of insulin: Secondary | ICD-10-CM | POA: Diagnosis not present

## 2017-01-31 DIAGNOSIS — E1065 Type 1 diabetes mellitus with hyperglycemia: Secondary | ICD-10-CM | POA: Diagnosis not present

## 2017-02-02 DIAGNOSIS — E109 Type 1 diabetes mellitus without complications: Secondary | ICD-10-CM | POA: Diagnosis not present

## 2017-02-02 DIAGNOSIS — E1065 Type 1 diabetes mellitus with hyperglycemia: Secondary | ICD-10-CM | POA: Diagnosis not present

## 2017-02-02 DIAGNOSIS — R110 Nausea: Secondary | ICD-10-CM | POA: Diagnosis not present

## 2017-02-02 DIAGNOSIS — Z794 Long term (current) use of insulin: Secondary | ICD-10-CM | POA: Diagnosis not present

## 2017-03-03 DIAGNOSIS — E109 Type 1 diabetes mellitus without complications: Secondary | ICD-10-CM | POA: Diagnosis not present

## 2017-03-03 DIAGNOSIS — E785 Hyperlipidemia, unspecified: Secondary | ICD-10-CM | POA: Diagnosis not present

## 2017-03-03 DIAGNOSIS — I10 Essential (primary) hypertension: Secondary | ICD-10-CM | POA: Diagnosis not present

## 2017-03-03 DIAGNOSIS — E1065 Type 1 diabetes mellitus with hyperglycemia: Secondary | ICD-10-CM | POA: Diagnosis not present

## 2017-03-03 DIAGNOSIS — N951 Menopausal and female climacteric states: Secondary | ICD-10-CM | POA: Diagnosis not present

## 2017-03-03 DIAGNOSIS — Z114 Encounter for screening for human immunodeficiency virus [HIV]: Secondary | ICD-10-CM | POA: Diagnosis not present

## 2017-03-03 DIAGNOSIS — N765 Ulceration of vagina: Secondary | ICD-10-CM | POA: Diagnosis not present

## 2017-03-03 DIAGNOSIS — Z794 Long term (current) use of insulin: Secondary | ICD-10-CM | POA: Diagnosis not present

## 2017-03-03 DIAGNOSIS — Z0000 Encounter for general adult medical examination without abnormal findings: Secondary | ICD-10-CM | POA: Diagnosis not present

## 2017-03-14 DIAGNOSIS — E109 Type 1 diabetes mellitus without complications: Secondary | ICD-10-CM | POA: Diagnosis not present

## 2017-03-14 DIAGNOSIS — Z794 Long term (current) use of insulin: Secondary | ICD-10-CM | POA: Diagnosis not present

## 2017-03-14 DIAGNOSIS — E1065 Type 1 diabetes mellitus with hyperglycemia: Secondary | ICD-10-CM | POA: Diagnosis not present

## 2017-03-23 DIAGNOSIS — Z1231 Encounter for screening mammogram for malignant neoplasm of breast: Secondary | ICD-10-CM | POA: Diagnosis not present

## 2017-04-25 DIAGNOSIS — E1065 Type 1 diabetes mellitus with hyperglycemia: Secondary | ICD-10-CM | POA: Diagnosis not present

## 2017-04-25 DIAGNOSIS — E109 Type 1 diabetes mellitus without complications: Secondary | ICD-10-CM | POA: Diagnosis not present

## 2017-04-25 DIAGNOSIS — Z794 Long term (current) use of insulin: Secondary | ICD-10-CM | POA: Diagnosis not present

## 2017-05-06 DIAGNOSIS — Z794 Long term (current) use of insulin: Secondary | ICD-10-CM | POA: Diagnosis not present

## 2017-05-06 DIAGNOSIS — E109 Type 1 diabetes mellitus without complications: Secondary | ICD-10-CM | POA: Diagnosis not present

## 2017-05-06 DIAGNOSIS — E1065 Type 1 diabetes mellitus with hyperglycemia: Secondary | ICD-10-CM | POA: Diagnosis not present

## 2017-05-19 DIAGNOSIS — M65332 Trigger finger, left middle finger: Secondary | ICD-10-CM | POA: Diagnosis not present

## 2017-06-14 DIAGNOSIS — J4541 Moderate persistent asthma with (acute) exacerbation: Secondary | ICD-10-CM | POA: Diagnosis not present

## 2017-06-14 DIAGNOSIS — E669 Obesity, unspecified: Secondary | ICD-10-CM | POA: Diagnosis not present

## 2017-06-20 DIAGNOSIS — Z794 Long term (current) use of insulin: Secondary | ICD-10-CM | POA: Diagnosis not present

## 2017-06-20 DIAGNOSIS — E1065 Type 1 diabetes mellitus with hyperglycemia: Secondary | ICD-10-CM | POA: Diagnosis not present

## 2017-06-20 DIAGNOSIS — E109 Type 1 diabetes mellitus without complications: Secondary | ICD-10-CM | POA: Diagnosis not present

## 2017-06-21 DIAGNOSIS — E1065 Type 1 diabetes mellitus with hyperglycemia: Secondary | ICD-10-CM | POA: Diagnosis not present

## 2017-06-21 DIAGNOSIS — Z794 Long term (current) use of insulin: Secondary | ICD-10-CM | POA: Diagnosis not present

## 2017-06-21 DIAGNOSIS — E109 Type 1 diabetes mellitus without complications: Secondary | ICD-10-CM | POA: Diagnosis not present

## 2017-07-21 DIAGNOSIS — E1065 Type 1 diabetes mellitus with hyperglycemia: Secondary | ICD-10-CM | POA: Diagnosis not present

## 2017-07-21 DIAGNOSIS — Z794 Long term (current) use of insulin: Secondary | ICD-10-CM | POA: Diagnosis not present

## 2017-07-21 DIAGNOSIS — E109 Type 1 diabetes mellitus without complications: Secondary | ICD-10-CM | POA: Diagnosis not present

## 2017-07-25 DIAGNOSIS — Z794 Long term (current) use of insulin: Secondary | ICD-10-CM | POA: Diagnosis not present

## 2017-07-25 DIAGNOSIS — E1065 Type 1 diabetes mellitus with hyperglycemia: Secondary | ICD-10-CM | POA: Diagnosis not present

## 2017-07-25 DIAGNOSIS — E109 Type 1 diabetes mellitus without complications: Secondary | ICD-10-CM | POA: Diagnosis not present

## 2017-08-19 DIAGNOSIS — M65332 Trigger finger, left middle finger: Secondary | ICD-10-CM | POA: Diagnosis not present

## 2017-09-06 ENCOUNTER — Ambulatory Visit
Admission: RE | Admit: 2017-09-06 | Discharge: 2017-09-06 | Disposition: A | Discharge status: Home / Self Care | Payer: BLUE CROSS/BLUE SHIELD | Source: Ambulatory Visit | Attending: Family Medicine | Admitting: Family Medicine

## 2017-09-06 ENCOUNTER — Other Ambulatory Visit: Payer: Self-pay | Admitting: Family Medicine

## 2017-09-06 DIAGNOSIS — R1011 Right upper quadrant pain: Secondary | ICD-10-CM

## 2017-09-06 DIAGNOSIS — R110 Nausea: Secondary | ICD-10-CM | POA: Diagnosis not present

## 2017-09-06 DIAGNOSIS — K7689 Other specified diseases of liver: Secondary | ICD-10-CM | POA: Diagnosis not present

## 2017-09-07 DIAGNOSIS — Z713 Dietary counseling and surveillance: Secondary | ICD-10-CM | POA: Diagnosis not present

## 2017-09-07 DIAGNOSIS — T148XXA Other injury of unspecified body region, initial encounter: Secondary | ICD-10-CM | POA: Diagnosis not present

## 2017-09-09 ENCOUNTER — Other Ambulatory Visit: Payer: Self-pay | Admitting: Family Medicine

## 2017-09-09 DIAGNOSIS — R1011 Right upper quadrant pain: Secondary | ICD-10-CM

## 2017-09-14 DIAGNOSIS — J309 Allergic rhinitis, unspecified: Secondary | ICD-10-CM | POA: Diagnosis not present

## 2017-09-14 DIAGNOSIS — I10 Essential (primary) hypertension: Secondary | ICD-10-CM | POA: Diagnosis not present

## 2017-09-14 DIAGNOSIS — Z23 Encounter for immunization: Secondary | ICD-10-CM | POA: Diagnosis not present

## 2017-09-14 DIAGNOSIS — J4541 Moderate persistent asthma with (acute) exacerbation: Secondary | ICD-10-CM | POA: Diagnosis not present

## 2017-09-22 DIAGNOSIS — E109 Type 1 diabetes mellitus without complications: Secondary | ICD-10-CM | POA: Diagnosis not present

## 2017-09-22 DIAGNOSIS — E1065 Type 1 diabetes mellitus with hyperglycemia: Secondary | ICD-10-CM | POA: Diagnosis not present

## 2017-09-22 DIAGNOSIS — Z794 Long term (current) use of insulin: Secondary | ICD-10-CM | POA: Diagnosis not present

## 2017-10-14 DIAGNOSIS — E109 Type 1 diabetes mellitus without complications: Secondary | ICD-10-CM | POA: Diagnosis not present

## 2017-10-14 DIAGNOSIS — Z794 Long term (current) use of insulin: Secondary | ICD-10-CM | POA: Diagnosis not present

## 2017-10-14 DIAGNOSIS — E1065 Type 1 diabetes mellitus with hyperglycemia: Secondary | ICD-10-CM | POA: Diagnosis not present

## 2017-10-20 DIAGNOSIS — Z713 Dietary counseling and surveillance: Secondary | ICD-10-CM | POA: Diagnosis not present

## 2017-12-08 DIAGNOSIS — E1065 Type 1 diabetes mellitus with hyperglycemia: Secondary | ICD-10-CM | POA: Diagnosis not present

## 2017-12-08 DIAGNOSIS — E109 Type 1 diabetes mellitus without complications: Secondary | ICD-10-CM | POA: Diagnosis not present

## 2017-12-08 DIAGNOSIS — Z794 Long term (current) use of insulin: Secondary | ICD-10-CM | POA: Diagnosis not present

## 2018-01-13 DIAGNOSIS — E109 Type 1 diabetes mellitus without complications: Secondary | ICD-10-CM | POA: Diagnosis not present

## 2018-01-13 DIAGNOSIS — Z794 Long term (current) use of insulin: Secondary | ICD-10-CM | POA: Diagnosis not present

## 2018-01-13 DIAGNOSIS — E1065 Type 1 diabetes mellitus with hyperglycemia: Secondary | ICD-10-CM | POA: Diagnosis not present

## 2018-01-16 DIAGNOSIS — Z794 Long term (current) use of insulin: Secondary | ICD-10-CM | POA: Diagnosis not present

## 2018-01-16 DIAGNOSIS — E1065 Type 1 diabetes mellitus with hyperglycemia: Secondary | ICD-10-CM | POA: Diagnosis not present

## 2018-01-16 DIAGNOSIS — E109 Type 1 diabetes mellitus without complications: Secondary | ICD-10-CM | POA: Diagnosis not present

## 2018-01-18 DIAGNOSIS — Z794 Long term (current) use of insulin: Secondary | ICD-10-CM | POA: Diagnosis not present

## 2018-01-18 DIAGNOSIS — E1065 Type 1 diabetes mellitus with hyperglycemia: Secondary | ICD-10-CM | POA: Diagnosis not present

## 2018-01-18 DIAGNOSIS — E109 Type 1 diabetes mellitus without complications: Secondary | ICD-10-CM | POA: Diagnosis not present

## 2018-01-31 DIAGNOSIS — M25561 Pain in right knee: Secondary | ICD-10-CM | POA: Diagnosis not present

## 2018-01-31 DIAGNOSIS — M11261 Other chondrocalcinosis, right knee: Secondary | ICD-10-CM | POA: Diagnosis not present

## 2018-02-07 ENCOUNTER — Encounter (HOSPITAL_COMMUNITY): Payer: Self-pay | Admitting: Emergency Medicine

## 2018-02-07 ENCOUNTER — Emergency Department (HOSPITAL_COMMUNITY)
Admission: EM | Admit: 2018-02-07 | Discharge: 2018-02-08 | Disposition: A | Discharge status: Home / Self Care | Payer: BLUE CROSS/BLUE SHIELD | Attending: Emergency Medicine | Admitting: Emergency Medicine

## 2018-02-07 DIAGNOSIS — Z79899 Other long term (current) drug therapy: Secondary | ICD-10-CM | POA: Insufficient documentation

## 2018-02-07 DIAGNOSIS — M6281 Muscle weakness (generalized): Secondary | ICD-10-CM | POA: Insufficient documentation

## 2018-02-07 DIAGNOSIS — R55 Syncope and collapse: Secondary | ICD-10-CM | POA: Insufficient documentation

## 2018-02-07 DIAGNOSIS — R030 Elevated blood-pressure reading, without diagnosis of hypertension: Secondary | ICD-10-CM | POA: Diagnosis not present

## 2018-02-07 DIAGNOSIS — R531 Weakness: Secondary | ICD-10-CM | POA: Diagnosis not present

## 2018-02-07 DIAGNOSIS — I6523 Occlusion and stenosis of bilateral carotid arteries: Secondary | ICD-10-CM | POA: Diagnosis not present

## 2018-02-07 DIAGNOSIS — J45909 Unspecified asthma, uncomplicated: Secondary | ICD-10-CM | POA: Diagnosis not present

## 2018-02-07 DIAGNOSIS — E109 Type 1 diabetes mellitus without complications: Secondary | ICD-10-CM | POA: Diagnosis not present

## 2018-02-07 DIAGNOSIS — I10 Essential (primary) hypertension: Secondary | ICD-10-CM | POA: Diagnosis not present

## 2018-02-07 DIAGNOSIS — R5383 Other fatigue: Secondary | ICD-10-CM | POA: Diagnosis not present

## 2018-02-07 LAB — CBC
HCT: 47.4 % — ABNORMAL HIGH (ref 36.0–46.0)
HEMOGLOBIN: 15.7 g/dL — AB (ref 12.0–15.0)
MCH: 28.8 pg (ref 26.0–34.0)
MCHC: 33.1 g/dL (ref 30.0–36.0)
MCV: 86.8 fL (ref 78.0–100.0)
Platelets: 331 10*3/uL (ref 150–400)
RBC: 5.46 MIL/uL — AB (ref 3.87–5.11)
RDW: 13.4 % (ref 11.5–15.5)
WBC: 11.9 10*3/uL — ABNORMAL HIGH (ref 4.0–10.5)

## 2018-02-07 LAB — URINALYSIS, ROUTINE W REFLEX MICROSCOPIC
BILIRUBIN URINE: NEGATIVE
Bacteria, UA: NONE SEEN
Glucose, UA: NEGATIVE mg/dL
HGB URINE DIPSTICK: NEGATIVE
Ketones, ur: NEGATIVE mg/dL
Nitrite: NEGATIVE
Protein, ur: NEGATIVE mg/dL
SPECIFIC GRAVITY, URINE: 1.005 (ref 1.005–1.030)
Squamous Epithelial / LPF: NONE SEEN
pH: 6 (ref 5.0–8.0)

## 2018-02-07 LAB — BASIC METABOLIC PANEL
ANION GAP: 10 (ref 5–15)
BUN: 15 mg/dL (ref 6–20)
CALCIUM: 9.1 mg/dL (ref 8.9–10.3)
CO2: 25 mmol/L (ref 22–32)
Chloride: 103 mmol/L (ref 101–111)
Creatinine, Ser: 1 mg/dL (ref 0.44–1.00)
GFR, EST NON AFRICAN AMERICAN: 60 mL/min — AB (ref >60)
Glucose, Bld: 76 mg/dL (ref 65–99)
Potassium: 4.1 mmol/L (ref 3.5–5.1)
SODIUM: 138 mmol/L (ref 135–145)

## 2018-02-07 LAB — CBG MONITORING, ED: GLUCOSE-CAPILLARY: 77 mg/dL (ref 65–99)

## 2018-02-07 NOTE — ED Notes (Signed)
Pt was given apple juice for CBG 77

## 2018-02-07 NOTE — ED Triage Notes (Signed)
BIB EMS from work, pt had sudden onset of generalized weakness and fatigue. Pt initially hypertensive, 200/120. No neuro deficits.

## 2018-02-07 NOTE — ED Notes (Addendum)
Pt stated her blood sugar was trending downwards, currently at 91. Pt wears an insuline pump with monitor. Initial CBG at triage was 77, and RN gave apple juice to pt. RN informed, and requested this EMT provide another cup of apple juice to pt. Pt given apple juice.

## 2018-02-08 ENCOUNTER — Emergency Department (HOSPITAL_COMMUNITY): Payer: BLUE CROSS/BLUE SHIELD

## 2018-02-08 DIAGNOSIS — I6523 Occlusion and stenosis of bilateral carotid arteries: Secondary | ICD-10-CM | POA: Diagnosis not present

## 2018-02-08 DIAGNOSIS — R531 Weakness: Secondary | ICD-10-CM | POA: Diagnosis not present

## 2018-02-08 LAB — TSH: TSH: 2.398 u[IU]/mL (ref 0.350–4.500)

## 2018-02-08 LAB — TROPONIN I: Troponin I: <0.03 ng/mL (ref <0.03)

## 2018-02-08 LAB — CK: Total CK: 140 U/L (ref 38–234)

## 2018-02-08 LAB — SEDIMENTATION RATE: Sed Rate: 3 mm/hr (ref 0–22)

## 2018-02-08 MED ORDER — SODIUM CHLORIDE 0.9 % IV BOLUS (SEPSIS)
1000.0000 mL | Freq: Once | INTRAVENOUS | Status: AC
Start: 2018-02-08 — End: 2018-02-08
  Administered 2018-02-08: 1000 mL via INTRAVENOUS

## 2018-02-08 MED ORDER — SODIUM CHLORIDE 0.9 % IV BOLUS (SEPSIS): 1000.0000 mL | Freq: Once | INTRAVENOUS | Status: DC

## 2018-02-08 MED ORDER — IOPAMIDOL (ISOVUE-370) INJECTION 76%
INTRAVENOUS | Status: AC
  Administered 2018-02-08: 50 mL
  Filled 2018-02-08: qty 50

## 2018-02-08 NOTE — Discharge Instructions (Signed)
Keep yourself hydrated.  Monitor your blood sugars closely.  Follow-up with your doctor.  Return to the ED if you develop new or worsening symptoms.

## 2018-02-08 NOTE — ED Notes (Signed)
Pt ambulated to BR w/ no assistance. Given PO fluids, and showing NAD. Tolerated fluids well. RR even and unlabored.

## 2018-02-08 NOTE — ED Provider Notes (Signed)
[facility] EMERGENCY DEPARTMENT Provider Note   CSN: [id number] Arrival date & time: 02/07/18  1909     History   Chief Complaint Chief Complaint  Patient presents with  . Weakness    HPI Kerry Compton is a 62 y.o. female.  Patient is a type I diabetic on insulin pump presenting with acute onset of weakness around 5 PM.  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  Suddenly she felt like the "life drained out of me".  She complained Of generalized weakness and fatigue.  She denies any focal weakness, numbness or tingling.  She states she was not hypoglycemic and her blood sugar was 135.  Thought she needed coffee and went to make some. She had an episode of lightheadedness and had to sit down before she thought she was going to pass out.  No chest pain or shortness of breath.  No change in her vision.  No diaphoresis, nausea or vomiting.  Is never had this kind of episode before.  States she had a normal day otherwise and has been eating and drinking per her normal.  She normally eats dinner around 7 PM. No fever or recent illness. No vomiting or diarrhea.   Patient notes that over the past 3 months she has had a shooting pain in the left side of her neck that radiates up to her head.  This pain comes and goes lasting for 10-15 seconds at a time.  It happens several times a week and is becoming more frequent.  She talked about it to her PCP but was not given a diagnosis.  She is wondering if this may be related. No vision changes.   The history is provided by the patient.    Past Medical History:  Diagnosis Date  . Allergy   . Asthma   . Diabetes mellitus   . 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, no current liver problems  . Hyperlipemia   . Hypertension     Patient Active Problem List   Diagnosis Date Noted  . Asthma 08/28/2015  . Hyperkalemia 08/27/2015  . Diabetic ketoacidosis without coma associated with diabetes  mellitus due to underlying condition ([place])   . Leukocytosis 02/09/2015  . Acute renal failure syndrome ([place])   . DKA (diabetic ketoacidoses) ([place]) 02/08/2015  . Gastroenteritis and colitis, viral 02/08/2015  . AKI (acute kidney injury) ([place]) 02/08/2015  . Nausea vomiting and diarrhea 02/08/2015  . Type 1 diabetes mellitus ([place]) 03/19/2013  . Hyperlipidemia 03/19/2013  . Asthma, chronic 03/19/2013  . Hypertension 03/19/2013    Past Surgical History:  Procedure Laterality Date  . COLONOSCOPY WITH PROPOFOL N/A 06/18/2014   Procedure: COLONOSCOPY WITH PROPOFOL;  Surgeon: Garlan Fair, MD;  Location: [facility] ENDOSCOPY;  Service: Endoscopy;  Laterality: N/A;  . ROTATOR CUFF REPAIR Right 10 yrs ago  . SEPTOPLASTY    . TUBAL LIGATION      OB History    No data available       Home Medications    Prior to Admission medications   Medication Sig Start Date End Date Taking? Authorizing Provider  albuterol (PROVENTIL HFA;VENTOLIN HFA) 108 (90 BASE) MCG/ACT inhaler Inhale into the lungs every 6 (six) hours as needed for wheezing or shortness of breath.    [provider]  atorvastatin (LIPITOR) 80 MG tablet Take 80 mg by mouth daily.      [provider]  insulin glargine (LANTUS)  100 UNIT/ML injection Inject 15 Units into the skin daily as needed (if bs is too high).     [provider]  Insulin Human (INSULIN PUMP) 100 unit/ml SOLN Inject into the skin continuous. Humalog insulin pump basal rate 1200 am to 300 am = 1.0 600 am to 600 pm = 0.90    [provider]  loratadine (CLARITIN) 10 MG tablet Take 10 mg by mouth daily.      [provider]  losartan (COZAAR) 50 MG tablet Take 50 mg by mouth every morning.    [provider]  metFORMIN (GLUCOPHAGE) 500 MG tablet Take 500 mg by mouth daily.     [provider]  Multiple Vitamin (MULTIVITAMIN WITH MINERALS) TABS tablet Take 1 tablet by mouth daily.    [provider]    naproxen sodium (ANAPROX) 220 MG tablet Take 440 mg by mouth every 12 (twelve) hours as needed (for pain).     [provider]    Family History Family History  Problem Relation Age of Onset  . Diabetes Mother   . Asthma Mother   . Arthritis Mother   . Diabetes Brother   . Diabetes Maternal Grandmother   . Diabetes Brother   . Hypertension Brother   . Hyperlipidemia Brother     Social History Social History   Tobacco Use  . Smoking status: Never Smoker  . Smokeless tobacco: Never Used  Substance Use Topics  . Alcohol use: No    Alcohol/week: 0.0 oz    Frequency: Never  . Drug use: No     Allergies   Azithromycin and Indocin [indomethacin]   Review of Systems Review of Systems  Constitutional: Positive for fatigue. Negative for activity change, appetite change and fever.  HENT: Negative for congestion, rhinorrhea and sore throat.   Respiratory: Negative for cough, chest tightness and shortness of breath.   Cardiovascular: Negative for chest pain.  Gastrointestinal: Negative for abdominal pain, nausea and vomiting.  Genitourinary: Negative for dysuria, hematuria, vaginal bleeding and vaginal discharge.  Musculoskeletal: Negative for arthralgias and myalgias.  Skin: Negative for wound.  Neurological: Positive for dizziness, weakness and light-headedness. Negative for speech difficulty and headaches.   all other systems are negative except as noted in the HPI and PMH.     Physical Exam Updated Vital Signs BP 138/87 (BP Location: Left Arm)   Pulse 88   Temp 98.4 F (36.9 C) (Oral)   Resp 18   Ht 5' 5"  (1.651 m)   Wt 81.6 kg (180 lb)   SpO2 98%   BMI 29.95 kg/m   Physical Exam  Constitutional: She is oriented to person, place, and time. She appears well-developed and well-nourished. No distress.  Well appearing  HENT:  Head: Normocephalic and atraumatic.  Mouth/Throat: Oropharynx is clear and moist. No oropharyngeal exudate.  Eyes: Conjunctivae  and EOM are normal. Pupils are equal, round, and reactive to light.  Neck: Normal range of motion. Neck supple.  No meningismus.  Cardiovascular: Normal rate, regular rhythm, normal heart sounds and intact distal pulses.  No murmur heard. Pulmonary/Chest: Effort normal and breath sounds normal. No respiratory distress.  Abdominal: Soft. There is no tenderness. There is no rebound and no guarding.  Musculoskeletal: Normal range of motion. She exhibits no edema or tenderness.  Neurological: She is alert and oriented to person, place, and time. No cranial nerve deficit. She exhibits normal muscle tone. Coordination normal.  CN 2-12 intact, no ataxia on finger to nose,  no nystagmus, 5/5 strength throughout, no pronator drift, Romberg negative, normal gait.   Skin: Skin is warm.  Psychiatric: She has a normal mood and affect. Her behavior is normal.  Nursing note and vitals reviewed.    ED Treatments / Results  Labs (all labs ordered are listed, but only abnormal results are displayed) Labs Reviewed  BASIC METABOLIC PANEL - Abnormal; Notable for the following components:      Result Value   GFR calc non Af Amer 60 (*)    All other components within normal limits  CBC - Abnormal; Notable for the following components:   WBC 11.9 (*)    RBC 5.46 (*)    Hemoglobin 15.7 (*)    HCT 47.4 (*)    All other components within normal limits  URINALYSIS, ROUTINE W REFLEX MICROSCOPIC - Abnormal; Notable for the following components:   Leukocytes, UA TRACE (*)    All other components within normal limits  TROPONIN I  TSH  CK  SEDIMENTATION RATE  CBG MONITORING, ED    EKG  EKG Interpretation  Date/Time:  Tuesday February 07 2018 19:20:43 EST Ventricular Rate:  97 PR Interval:  134 QRS Duration: 74 QT Interval:  334 QTC Calculation: 424 R Axis:   52 Text Interpretation:  Normal sinus rhythm Normal ECG No significant change was found Confirmed by Ezequiel Essex [phone]) on 02/07/2018  11:56:45 PM       Radiology No results found.  Procedures Procedures (including critical care time)  Medications Ordered in ED Medications  sodium chloride 0.9 % bolus 1,000 mL (1,000 mLs Intravenous New Bag/Given 02/08/18 0021)  iopamidol (ISOVUE-370) 76 % injection (not administered)     Initial Impression / Assessment and Plan / ED Course  I have reviewed the triage vital signs and the nursing notes.  Pertinent labs & imaging results that were available during my care of the patient were reviewed by me and considered in my medical decision making (see chart for details).    Diabetic patient with sudden onset of generalized weakness and fatigue.  She appears well and nontoxic.  No fever or recent illnesses.  Nonfocal neurological exam.  EKG is unchanged and nonischemic  Troponin is negative. 6 hours after symptom onset.  Low suspicion for ACS. Delta troponin not required.   No evidence of infection. UA and CXR negative. CBGs have been normal.  IVF given, mildly orthostatic.  CTA obtained given intermittent L sided shooting neck pain. This was reassuring. ESR normal. Low suspicion for temporal arteritis, meningitis, SAH.  Patient feels improved and is tolerating p.o. and ambulatory.  No focal neurological deficits.  CBGs have been within normal limits.  She is tolerating p.o. and ambulatory.  Unclear source of sudden onset fatigue. TSH wnl. No evidence of infection, or hypoglycemia. Advised hydration, regular meals and followup with PCP this week. Return precautions discussed.   Final Clinical Impressions(s) / ED Diagnoses   Final diagnoses:  Generalized weakness  Near syncope    ED Discharge Orders    None       Staley Lunz, Annie Main, MD 02/08/18 2030

## 2018-03-15 DIAGNOSIS — D72828 Other elevated white blood cell count: Secondary | ICD-10-CM | POA: Diagnosis not present

## 2018-03-15 DIAGNOSIS — I10 Essential (primary) hypertension: Secondary | ICD-10-CM | POA: Diagnosis not present

## 2018-03-15 DIAGNOSIS — J309 Allergic rhinitis, unspecified: Secondary | ICD-10-CM | POA: Diagnosis not present

## 2018-03-15 DIAGNOSIS — J4520 Mild intermittent asthma, uncomplicated: Secondary | ICD-10-CM | POA: Diagnosis not present

## 2018-03-15 DIAGNOSIS — E669 Obesity, unspecified: Secondary | ICD-10-CM | POA: Diagnosis not present

## 2018-04-03 DIAGNOSIS — Z794 Long term (current) use of insulin: Secondary | ICD-10-CM | POA: Diagnosis not present

## 2018-04-03 DIAGNOSIS — E1065 Type 1 diabetes mellitus with hyperglycemia: Secondary | ICD-10-CM | POA: Diagnosis not present

## 2018-04-03 DIAGNOSIS — E109 Type 1 diabetes mellitus without complications: Secondary | ICD-10-CM | POA: Diagnosis not present

## 2018-04-17 DIAGNOSIS — Z794 Long term (current) use of insulin: Secondary | ICD-10-CM | POA: Diagnosis not present

## 2018-04-17 DIAGNOSIS — E109 Type 1 diabetes mellitus without complications: Secondary | ICD-10-CM | POA: Diagnosis not present

## 2018-04-17 DIAGNOSIS — E1065 Type 1 diabetes mellitus with hyperglycemia: Secondary | ICD-10-CM | POA: Diagnosis not present

## 2018-04-19 DIAGNOSIS — J4520 Mild intermittent asthma, uncomplicated: Secondary | ICD-10-CM | POA: Diagnosis not present

## 2018-04-19 DIAGNOSIS — J069 Acute upper respiratory infection, unspecified: Secondary | ICD-10-CM | POA: Diagnosis not present

## 2018-04-20 DIAGNOSIS — E109 Type 1 diabetes mellitus without complications: Secondary | ICD-10-CM | POA: Diagnosis not present

## 2018-04-20 DIAGNOSIS — E1065 Type 1 diabetes mellitus with hyperglycemia: Secondary | ICD-10-CM | POA: Diagnosis not present

## 2018-04-20 DIAGNOSIS — Z794 Long term (current) use of insulin: Secondary | ICD-10-CM | POA: Diagnosis not present

## 2018-05-18 ENCOUNTER — Encounter (HOSPITAL_COMMUNITY): Payer: Self-pay | Admitting: Emergency Medicine

## 2018-05-18 ENCOUNTER — Emergency Department (HOSPITAL_COMMUNITY)
Admission: EM | Admit: 2018-05-18 | Discharge: 2018-05-18 | Disposition: A | Discharge status: Home / Self Care | Payer: BLUE CROSS/BLUE SHIELD | Attending: Emergency Medicine | Admitting: Emergency Medicine

## 2018-05-18 ENCOUNTER — Emergency Department (HOSPITAL_COMMUNITY): Payer: BLUE CROSS/BLUE SHIELD

## 2018-05-18 DIAGNOSIS — I10 Essential (primary) hypertension: Secondary | ICD-10-CM | POA: Insufficient documentation

## 2018-05-18 DIAGNOSIS — R51 Headache: Secondary | ICD-10-CM | POA: Diagnosis not present

## 2018-05-18 DIAGNOSIS — Z794 Long term (current) use of insulin: Secondary | ICD-10-CM | POA: Insufficient documentation

## 2018-05-18 DIAGNOSIS — E119 Type 2 diabetes mellitus without complications: Secondary | ICD-10-CM | POA: Insufficient documentation

## 2018-05-18 DIAGNOSIS — H539 Unspecified visual disturbance: Secondary | ICD-10-CM | POA: Diagnosis not present

## 2018-05-18 DIAGNOSIS — Z79899 Other long term (current) drug therapy: Secondary | ICD-10-CM | POA: Diagnosis not present

## 2018-05-18 DIAGNOSIS — R519 Headache, unspecified: Secondary | ICD-10-CM

## 2018-05-18 LAB — BASIC METABOLIC PANEL
ANION GAP: 5 (ref 5–15)
BUN: 13 mg/dL (ref 6–20)
CALCIUM: 9.5 mg/dL (ref 8.9–10.3)
CO2: 30 mmol/L (ref 22–32)
Chloride: 107 mmol/L (ref 101–111)
Creatinine, Ser: 0.8 mg/dL (ref 0.44–1.00)
GFR calc Af Amer: >60 mL/min (ref >60)
Glucose, Bld: 150 mg/dL — ABNORMAL HIGH (ref 65–99)
POTASSIUM: 3.8 mmol/L (ref 3.5–5.1)
Sodium: 142 mmol/L (ref 135–145)

## 2018-05-18 LAB — CBC
HCT: 44.1 % (ref 36.0–46.0)
HEMOGLOBIN: 14.8 g/dL (ref 12.0–15.0)
MCH: 28.8 pg (ref 26.0–34.0)
MCHC: 33.6 g/dL (ref 30.0–36.0)
MCV: 86 fL (ref 78.0–100.0)
Platelets: 274 10*3/uL (ref 150–400)
RBC: 5.13 MIL/uL — AB (ref 3.87–5.11)
RDW: 12.7 % (ref 11.5–15.5)
WBC: 7.6 10*3/uL (ref 4.0–10.5)

## 2018-05-18 MED ORDER — SODIUM CHLORIDE 0.9 % IV BOLUS
1000.0000 mL | Freq: Once | INTRAVENOUS | Status: AC
  Administered 2018-05-18: 1000 mL via INTRAVENOUS

## 2018-05-18 MED ORDER — METOCLOPRAMIDE HCL 5 MG/ML IJ SOLN
10.0000 mg | Freq: Once | INTRAMUSCULAR | Status: AC
  Administered 2018-05-18: 10 mg via INTRAVENOUS
  Filled 2018-05-18: qty 2

## 2018-05-18 MED ORDER — ONDANSETRON 4 MG PO TBDP: 4.0000 mg | ORAL_TABLET | Freq: Three times a day (TID) | ORAL | 0 refills | Status: DC | PRN

## 2018-05-18 MED ORDER — DIPHENHYDRAMINE HCL 50 MG/ML IJ SOLN
12.5000 mg | Freq: Once | INTRAMUSCULAR | Status: AC
  Administered 2018-05-18: 12.5 mg via INTRAVENOUS
  Filled 2018-05-18: qty 1

## 2018-05-18 MED ORDER — SODIUM CHLORIDE 0.9 % IV SOLN
INTRAVENOUS | Status: DC
  Administered 2018-05-18: 20:00:00 via INTRAVENOUS

## 2018-05-18 NOTE — ED Provider Notes (Signed)
[facility]-EMERGENCY DEPT Provider Note   CSN: [id number] Arrival date & time: 05/18/18  1410     History   Chief Complaint Chief Complaint  Patient presents with  . Headache  . Visual Field Change    HPI Kerry Compton is a 62 y.o. female.  Pt presents to the ED today with visual disturbances and a headache.  She would see flashers in the left side of her vision.  She checked her bp and it was high.  The pt said she had it start yesterday while at work and she rested and it finally went away.  She made an appt with the eye doctor for this morning, but cancelled it when it went away.  The pt said she was ok until around 1300 when sx started again.  She called her doctor who told her to come to the ED.  She has had a long wait and sx have mostly resolved.  She denies any difficulty walking/talking.  No other neurological problem.  Pt has a hx of DM, but she has a pump and a sensor and her BS has been well controlled.      Past Medical History:  Diagnosis Date  . Allergy   . Asthma   . Diabetes mellitus   . 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, no current liver problems  . Hyperlipemia   . Hypertension     Patient Active Problem List   Diagnosis Date Noted  . Asthma 08/28/2015  . Hyperkalemia 08/27/2015  . Diabetic ketoacidosis without coma associated with diabetes mellitus due to underlying condition (HCC)   . Leukocytosis 02/09/2015  . Acute renal failure syndrome (HCC)   . DKA (diabetic ketoacidoses) (HCC) 02/08/2015  . Gastroenteritis and colitis, viral 02/08/2015  . AKI (acute kidney injury) (HCC) 02/08/2015  . Nausea vomiting and diarrhea 02/08/2015  . Type 1 diabetes mellitus (HCC) 03/19/2013  . Hyperlipidemia 03/19/2013  . Asthma, chronic 03/19/2013  . Hypertension 03/19/2013    Past Surgical History:  Procedure Laterality Date  . COLONOSCOPY WITH PROPOFOL N/A 06/18/2014   Procedure: COLONOSCOPY WITH PROPOFOL;  Surgeon: Charolett Bumpers, MD;  Location: [facility];  Service: Endoscopy;  Laterality: N/A;  . ROTATOR CUFF REPAIR Right 10 yrs ago  . SEPTOPLASTY    . TUBAL LIGATION       OB History   None      Home Medications    Prior to Admission medications   Medication Sig Start Date End Date Taking? Authorizing Provider  albuterol (PROVENTIL HFA;VENTOLIN HFA) 108 (90 BASE) MCG/ACT inhaler Inhale into the lungs every 6 (six) hours as needed for wheezing or shortness of breath.    [provider]  atorvastatin (LIPITOR) 80 MG tablet Take 80 mg by mouth daily.      [provider]  fexofenadine (ALLEGRA) 180 MG tablet Take 180 mg by mouth daily.    [provider]  fluticasone (FLOVENT HFA) 44 MCG/ACT inhaler Inhale 1 puff into the lungs 2 (two) times daily.    [provider]  insulin glargine (LANTUS) 100 UNIT/ML injection Inject 15 Units into the skin daily as needed (if bs is too high).     [provider]  Insulin Human (INSULIN PUMP) 100 unit/ml SOLN Inject into the skin continuous. Humalog insulin pump basal rate 1200 am to 300 am = 1.0 600 am to 600 pm = 0.90    [provider]  losartan (COZAAR) 50 MG  tablet Take 50 mg by mouth every morning.    [provider]  Multiple Vitamin (MULTIVITAMIN WITH MINERALS) TABS tablet Take 1 tablet by mouth daily.    [provider]  ondansetron (ZOFRAN ODT) 4 MG disintegrating tablet Take 1 tablet (4 mg total) by mouth every 8 (eight) hours as needed. 05/18/18   Jacalyn LefevreHaviland, Eswin Worrell, MD    Family History Family History  Problem Relation Age of Onset  . Diabetes Mother   . Asthma Mother   . Arthritis Mother   . Diabetes Brother   . Diabetes Maternal Grandmother   . Diabetes Brother   . Hypertension Brother   . Hyperlipidemia Brother     Social History Social History   Tobacco Use  . Smoking status: Never Smoker  . Smokeless tobacco: Never Used  Substance Use Topics  . Alcohol use: No     Alcohol/week: 0.0 oz    Frequency: Never  . Drug use: No     Allergies   Azithromycin and Indocin [indomethacin]   Review of Systems Review of Systems  Eyes: Positive for visual disturbance.  Neurological: Positive for headaches.  All other systems reviewed and are negative.    Physical Exam Updated Vital Signs BP (!) 164/86   Pulse 91   Temp 98.4 F (36.9 C) (Oral)   Resp 16   Ht 5\' 5"  (1.651 m)   Wt 90.7 kg (200 lb)   SpO2 100%   BMI 33.28 kg/m   Physical Exam  Constitutional: She is oriented to person, place, and time. She appears well-developed and well-nourished.  HENT:  Head: Normocephalic and atraumatic.  Mouth/Throat: Oropharynx is clear and moist.  Eyes: Pupils are equal, round, and reactive to light. EOM are normal.  Neck: Normal range of motion. Neck supple.  Cardiovascular: Normal rate, regular rhythm, normal heart sounds and intact distal pulses.  Pulmonary/Chest: Effort normal and breath sounds normal.  Abdominal: Soft. Bowel sounds are normal.  Musculoskeletal: Normal range of motion.  Neurological: She is alert and oriented to person, place, and time. She has normal strength. She displays a negative Romberg sign.  Skin: Skin is warm and dry. Capillary refill takes less than 2 seconds.  Psychiatric: She has a normal mood and affect. Her behavior is normal.  Nursing note and vitals reviewed.    ED Treatments / Results  Labs (all labs ordered are listed, but only abnormal results are displayed) Labs Reviewed  BASIC METABOLIC PANEL - Abnormal; Notable for the following components:      Result Value   Glucose, Bld 150 (*)    All other components within normal limits  CBC - Abnormal; Notable for the following components:   RBC 5.13 (*)    All other components within normal limits    EKG None  Radiology Ct Head Wo Contrast  Result Date: 05/18/2018 CLINICAL DATA:  Headache, acute, severe, worst HA of life. Blurry vision. Elevated blood  pressure. EXAM: CT HEAD WITHOUT CONTRAST TECHNIQUE: Contiguous axial images were obtained from the base of the skull through the vertex without intravenous contrast. COMPARISON:  Head CT 02/08/2018 FINDINGS: Brain: Brain volume is normal for age. No intracranial hemorrhage, mass effect, or midline shift. Remote punctate lacunar infarct in the left thalamus. No hydrocephalus. The basilar cisterns are patent. No evidence of territorial infarct or acute ischemia. No extra-axial or intracranial fluid collection. Vascular: Atherosclerosis of skullbase vasculature without hyperdense vessel or abnormal calcification. Skull: No fracture or focal lesion. Sinuses/Orbits: Left mastoid sclerosis is  unchanged. Mucosal thickening in of maxillary sinuses and ethmoid air cells, slightly worsened. No sinus fluid levels. Occluded orbits are unremarkable. Other: None. IMPRESSION: 1.  No acute intracranial abnormality. 2. Paranasal sinus mucosal thickening, slightly progressed from February, no sinus fluid levels. Electronically Signed   By: Rubye Oaks M.D.   On: 05/18/2018 20:07    Procedures Procedures (including critical care time)  Medications Ordered in ED Medications  sodium chloride 0.9 % bolus 1,000 mL (1,000 mLs Intravenous New Bag/Given 05/18/18 2008)    And  0.9 %  sodium chloride infusion ( Intravenous New Bag/Given 05/18/18 2008)  metoCLOPramide (REGLAN) injection 10 mg (10 mg Intravenous Given 05/18/18 2008)  diphenhydrAMINE (BENADRYL) injection 12.5 mg (12.5 mg Intravenous Given 05/18/18 2008)     Initial Impression / Assessment and Plan / ED Course  I have reviewed the triage vital signs and the nursing notes.  Pertinent labs & imaging results that were available during my care of the patient were reviewed by me and considered in my medical decision making (see chart for details).      Visual Acuity  Right Eye Distance: 20/50 Left Eye Distance: 20/40 Bilateral Distance: 20/40  Right Eye Near:     Left Eye Near:    Bilateral Near:     Pt does wear glasses for far vision and VA was checked without the glasses.  Sx have resolved after fluids, reglan, and benadryl.  CT head ok.  ? Migraine.  Pt encouraged to f/u with pcp and with eye doctor.  Return if worse.   Final Clinical Impressions(s) / ED Diagnoses   Final diagnoses:  Visual disturbance  Acute nonintractable headache, unspecified headache type    ED Discharge Orders        Ordered    ondansetron (ZOFRAN ODT) 4 MG disintegrating tablet  Every 8 hours PRN     05/18/18 2038       Jacalyn Lefevre, MD 05/18/18 2040

## 2018-05-18 NOTE — ED Triage Notes (Addendum)
Pt c/o right side headache, seeing floaters in peripheral fields and high BP the past 2 days while at work.

## 2018-06-27 DIAGNOSIS — E1065 Type 1 diabetes mellitus with hyperglycemia: Secondary | ICD-10-CM | POA: Diagnosis not present

## 2018-06-27 DIAGNOSIS — Z794 Long term (current) use of insulin: Secondary | ICD-10-CM | POA: Diagnosis not present

## 2018-06-27 DIAGNOSIS — E109 Type 1 diabetes mellitus without complications: Secondary | ICD-10-CM | POA: Diagnosis not present

## 2018-07-31 DIAGNOSIS — E1065 Type 1 diabetes mellitus with hyperglycemia: Secondary | ICD-10-CM | POA: Diagnosis not present

## 2018-08-29 DIAGNOSIS — E109 Type 1 diabetes mellitus without complications: Secondary | ICD-10-CM | POA: Diagnosis not present

## 2018-08-29 DIAGNOSIS — E1065 Type 1 diabetes mellitus with hyperglycemia: Secondary | ICD-10-CM | POA: Diagnosis not present

## 2018-08-29 DIAGNOSIS — Z794 Long term (current) use of insulin: Secondary | ICD-10-CM | POA: Diagnosis not present

## 2018-09-14 DIAGNOSIS — J4520 Mild intermittent asthma, uncomplicated: Secondary | ICD-10-CM | POA: Diagnosis not present

## 2018-09-14 DIAGNOSIS — E785 Hyperlipidemia, unspecified: Secondary | ICD-10-CM | POA: Diagnosis not present

## 2018-09-14 DIAGNOSIS — Z23 Encounter for immunization: Secondary | ICD-10-CM | POA: Diagnosis not present

## 2018-09-14 DIAGNOSIS — I10 Essential (primary) hypertension: Secondary | ICD-10-CM | POA: Diagnosis not present

## 2018-09-21 DIAGNOSIS — E103293 Type 1 diabetes mellitus with mild nonproliferative diabetic retinopathy without macular edema, bilateral: Secondary | ICD-10-CM | POA: Diagnosis not present

## 2018-09-21 DIAGNOSIS — H2513 Age-related nuclear cataract, bilateral: Secondary | ICD-10-CM | POA: Diagnosis not present

## 2018-10-03 DIAGNOSIS — E109 Type 1 diabetes mellitus without complications: Secondary | ICD-10-CM | POA: Diagnosis not present

## 2018-10-03 DIAGNOSIS — Z794 Long term (current) use of insulin: Secondary | ICD-10-CM | POA: Diagnosis not present

## 2018-10-03 DIAGNOSIS — E1065 Type 1 diabetes mellitus with hyperglycemia: Secondary | ICD-10-CM | POA: Diagnosis not present

## 2018-10-06 DIAGNOSIS — Z794 Long term (current) use of insulin: Secondary | ICD-10-CM | POA: Diagnosis not present

## 2018-10-06 DIAGNOSIS — E109 Type 1 diabetes mellitus without complications: Secondary | ICD-10-CM | POA: Diagnosis not present

## 2018-10-25 DIAGNOSIS — Z139 Encounter for screening, unspecified: Secondary | ICD-10-CM | POA: Diagnosis not present

## 2018-10-25 DIAGNOSIS — Z1231 Encounter for screening mammogram for malignant neoplasm of breast: Secondary | ICD-10-CM | POA: Diagnosis not present

## 2018-10-26 DIAGNOSIS — M6588 Other synovitis and tenosynovitis, other site: Secondary | ICD-10-CM | POA: Diagnosis not present

## 2018-12-01 DIAGNOSIS — E109 Type 1 diabetes mellitus without complications: Secondary | ICD-10-CM | POA: Diagnosis not present

## 2018-12-01 DIAGNOSIS — E1065 Type 1 diabetes mellitus with hyperglycemia: Secondary | ICD-10-CM | POA: Diagnosis not present

## 2018-12-01 DIAGNOSIS — Z794 Long term (current) use of insulin: Secondary | ICD-10-CM | POA: Diagnosis not present

## 2018-12-15 DIAGNOSIS — J4520 Mild intermittent asthma, uncomplicated: Secondary | ICD-10-CM | POA: Diagnosis not present

## 2018-12-15 DIAGNOSIS — I10 Essential (primary) hypertension: Secondary | ICD-10-CM | POA: Diagnosis not present

## 2018-12-15 DIAGNOSIS — L84 Corns and callosities: Secondary | ICD-10-CM | POA: Diagnosis not present

## 2018-12-15 DIAGNOSIS — M792 Neuralgia and neuritis, unspecified: Secondary | ICD-10-CM | POA: Diagnosis not present

## 2019-01-08 DIAGNOSIS — E109 Type 1 diabetes mellitus without complications: Secondary | ICD-10-CM | POA: Diagnosis not present

## 2019-01-08 DIAGNOSIS — E1065 Type 1 diabetes mellitus with hyperglycemia: Secondary | ICD-10-CM | POA: Diagnosis not present

## 2019-01-08 DIAGNOSIS — Z794 Long term (current) use of insulin: Secondary | ICD-10-CM | POA: Diagnosis not present

## 2019-01-10 DIAGNOSIS — M79672 Pain in left foot: Secondary | ICD-10-CM | POA: Diagnosis not present

## 2019-01-17 DIAGNOSIS — M79672 Pain in left foot: Secondary | ICD-10-CM | POA: Diagnosis not present

## 2019-01-19 DIAGNOSIS — R079 Chest pain, unspecified: Secondary | ICD-10-CM | POA: Diagnosis not present

## 2019-01-19 DIAGNOSIS — Z794 Long term (current) use of insulin: Secondary | ICD-10-CM | POA: Diagnosis not present

## 2019-01-19 DIAGNOSIS — E109 Type 1 diabetes mellitus without complications: Secondary | ICD-10-CM | POA: Diagnosis not present

## 2019-01-20 DIAGNOSIS — R079 Chest pain, unspecified: Secondary | ICD-10-CM | POA: Diagnosis not present

## 2019-01-20 DIAGNOSIS — R0602 Shortness of breath: Secondary | ICD-10-CM | POA: Diagnosis not present

## 2019-01-20 DIAGNOSIS — I499 Cardiac arrhythmia, unspecified: Secondary | ICD-10-CM | POA: Diagnosis not present

## 2019-01-20 DIAGNOSIS — R0789 Other chest pain: Secondary | ICD-10-CM | POA: Diagnosis not present

## 2019-03-23 DIAGNOSIS — E109 Type 1 diabetes mellitus without complications: Secondary | ICD-10-CM | POA: Diagnosis not present

## 2019-03-23 DIAGNOSIS — Z794 Long term (current) use of insulin: Secondary | ICD-10-CM | POA: Diagnosis not present

## 2019-03-23 DIAGNOSIS — E1065 Type 1 diabetes mellitus with hyperglycemia: Secondary | ICD-10-CM | POA: Diagnosis not present

## 2019-03-26 DIAGNOSIS — E109 Type 1 diabetes mellitus without complications: Secondary | ICD-10-CM | POA: Diagnosis not present

## 2019-03-26 DIAGNOSIS — E1065 Type 1 diabetes mellitus with hyperglycemia: Secondary | ICD-10-CM | POA: Diagnosis not present

## 2019-03-26 DIAGNOSIS — Z794 Long term (current) use of insulin: Secondary | ICD-10-CM | POA: Diagnosis not present

## 2019-06-22 DIAGNOSIS — E1065 Type 1 diabetes mellitus with hyperglycemia: Secondary | ICD-10-CM | POA: Diagnosis not present

## 2019-06-22 DIAGNOSIS — E109 Type 1 diabetes mellitus without complications: Secondary | ICD-10-CM | POA: Diagnosis not present

## 2019-06-22 DIAGNOSIS — Z794 Long term (current) use of insulin: Secondary | ICD-10-CM | POA: Diagnosis not present

## 2019-06-26 DIAGNOSIS — E1065 Type 1 diabetes mellitus with hyperglycemia: Secondary | ICD-10-CM | POA: Diagnosis not present

## 2019-06-26 DIAGNOSIS — E109 Type 1 diabetes mellitus without complications: Secondary | ICD-10-CM | POA: Diagnosis not present

## 2019-06-26 DIAGNOSIS — Z794 Long term (current) use of insulin: Secondary | ICD-10-CM | POA: Diagnosis not present

## 2019-07-20 DIAGNOSIS — E109 Type 1 diabetes mellitus without complications: Secondary | ICD-10-CM | POA: Diagnosis not present

## 2019-07-20 DIAGNOSIS — Z794 Long term (current) use of insulin: Secondary | ICD-10-CM | POA: Diagnosis not present

## 2019-07-20 DIAGNOSIS — E1165 Type 2 diabetes mellitus with hyperglycemia: Secondary | ICD-10-CM | POA: Diagnosis not present

## 2019-09-04 DIAGNOSIS — M25512 Pain in left shoulder: Secondary | ICD-10-CM | POA: Diagnosis not present

## 2019-09-04 DIAGNOSIS — M7502 Adhesive capsulitis of left shoulder: Secondary | ICD-10-CM | POA: Diagnosis not present

## 2019-09-16 DIAGNOSIS — W19XXXA Unspecified fall, initial encounter: Secondary | ICD-10-CM | POA: Diagnosis not present

## 2019-09-16 DIAGNOSIS — M25572 Pain in left ankle and joints of left foot: Secondary | ICD-10-CM | POA: Diagnosis not present

## 2019-09-17 DIAGNOSIS — S8262XD Displaced fracture of lateral malleolus of left fibula, subsequent encounter for closed fracture with routine healing: Secondary | ICD-10-CM | POA: Diagnosis not present

## 2019-09-21 DIAGNOSIS — E109 Type 1 diabetes mellitus without complications: Secondary | ICD-10-CM | POA: Diagnosis not present

## 2019-09-21 DIAGNOSIS — E1065 Type 1 diabetes mellitus with hyperglycemia: Secondary | ICD-10-CM | POA: Diagnosis not present

## 2019-09-21 DIAGNOSIS — Z794 Long term (current) use of insulin: Secondary | ICD-10-CM | POA: Diagnosis not present

## 2019-09-24 DIAGNOSIS — Z794 Long term (current) use of insulin: Secondary | ICD-10-CM | POA: Diagnosis not present

## 2019-09-24 DIAGNOSIS — E1065 Type 1 diabetes mellitus with hyperglycemia: Secondary | ICD-10-CM | POA: Diagnosis not present

## 2019-09-24 DIAGNOSIS — E109 Type 1 diabetes mellitus without complications: Secondary | ICD-10-CM | POA: Diagnosis not present

## 2019-10-03 DIAGNOSIS — S8262XD Displaced fracture of lateral malleolus of left fibula, subsequent encounter for closed fracture with routine healing: Secondary | ICD-10-CM | POA: Diagnosis not present

## 2019-10-03 DIAGNOSIS — E113293 Type 2 diabetes mellitus with mild nonproliferative diabetic retinopathy without macular edema, bilateral: Secondary | ICD-10-CM | POA: Diagnosis not present

## 2019-10-03 DIAGNOSIS — H2513 Age-related nuclear cataract, bilateral: Secondary | ICD-10-CM | POA: Diagnosis not present

## 2019-10-03 DIAGNOSIS — H5213 Myopia, bilateral: Secondary | ICD-10-CM | POA: Diagnosis not present

## 2019-10-03 DIAGNOSIS — M25572 Pain in left ankle and joints of left foot: Secondary | ICD-10-CM | POA: Diagnosis not present

## 2019-10-26 ENCOUNTER — Other Ambulatory Visit: Payer: Self-pay | Admitting: Internal Medicine

## 2019-10-26 DIAGNOSIS — E2839 Other primary ovarian failure: Secondary | ICD-10-CM

## 2019-10-26 DIAGNOSIS — E109 Type 1 diabetes mellitus without complications: Secondary | ICD-10-CM | POA: Diagnosis not present

## 2019-10-26 DIAGNOSIS — Z794 Long term (current) use of insulin: Secondary | ICD-10-CM | POA: Diagnosis not present

## 2019-10-26 DIAGNOSIS — R197 Diarrhea, unspecified: Secondary | ICD-10-CM | POA: Diagnosis not present

## 2019-10-26 DIAGNOSIS — Z13820 Encounter for screening for osteoporosis: Secondary | ICD-10-CM | POA: Diagnosis not present

## 2019-10-30 ENCOUNTER — Other Ambulatory Visit: Payer: BLUE CROSS/BLUE SHIELD

## 2019-11-16 DIAGNOSIS — M25572 Pain in left ankle and joints of left foot: Secondary | ICD-10-CM | POA: Diagnosis not present

## 2019-11-16 DIAGNOSIS — S8262XD Displaced fracture of lateral malleolus of left fibula, subsequent encounter for closed fracture with routine healing: Secondary | ICD-10-CM | POA: Diagnosis not present

## 2019-11-30 ENCOUNTER — Other Ambulatory Visit: Payer: Self-pay | Admitting: Internal Medicine

## 2019-11-30 DIAGNOSIS — Z13820 Encounter for screening for osteoporosis: Secondary | ICD-10-CM

## 2019-12-04 ENCOUNTER — Other Ambulatory Visit: Payer: BC Managed Care – PPO

## 2019-12-17 IMAGING — CT CT ANGIO NECK
2 of 12 series · 6 of 35 positions shown · IV contrast (OMNI 350)
Comparison: 12/04/2013 thyroid ultrasound.  03/31/2010 MRI head.

CLINICAL DATA: 61 y/o F; sudden onset generalized weakness and
fatigue. Neck pain, initial exam.

EXAM:
CT ANGIOGRAPHY HEAD AND NECK
TECHNIQUE: Multidetector CT imaging of the head and neck was performed using
the standard protocol during bolus administration of intravenous
contrast. Multiplanar CT image reconstructions and MIPs were
obtained to evaluate the vascular anatomy. Carotid stenosis
measurements (when applicable) are obtained utilizing NASCET
criteria, using the distal internal carotid diameter as the
denominator.
CONTRAST:  50mL PJPI9U-WS5 IOPAMIDOL (PJPI9U-WS5) INJECTION 76%

[Series 7: cta neck axial · axial · 0.41mm/px · z∈[-231,-7]mm · 5 of 341 slices shown]
[im 57/341  soft-tissue]
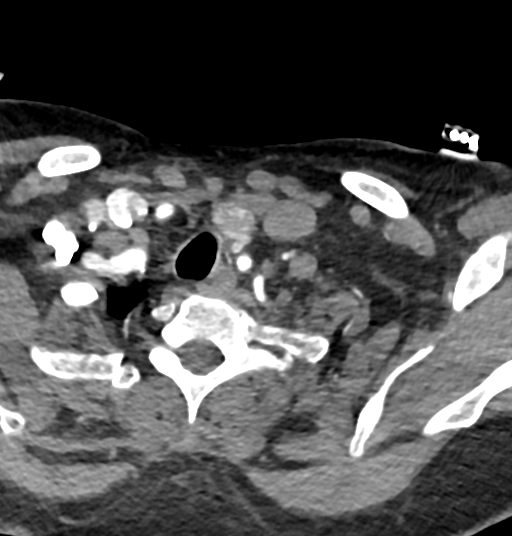
[im 114/341  bone]
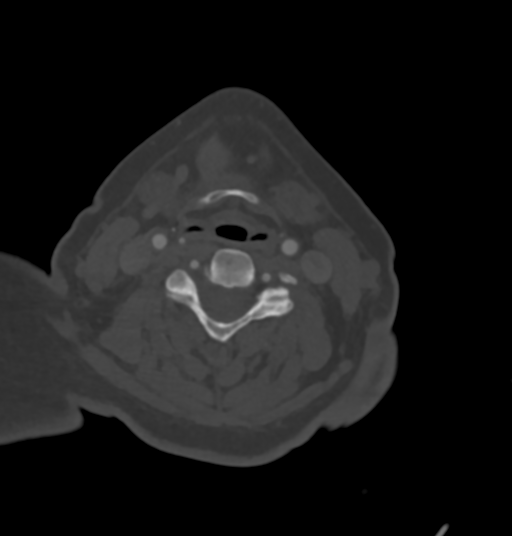
[im 171/341  soft-tissue]
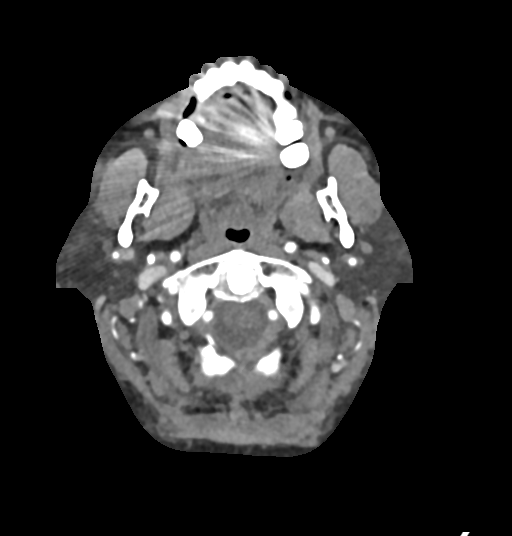
[im 227/341  bone]
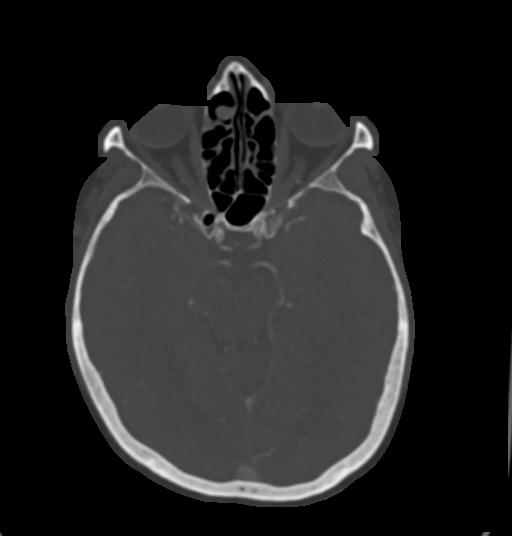
[im 284/341  soft-tissue]
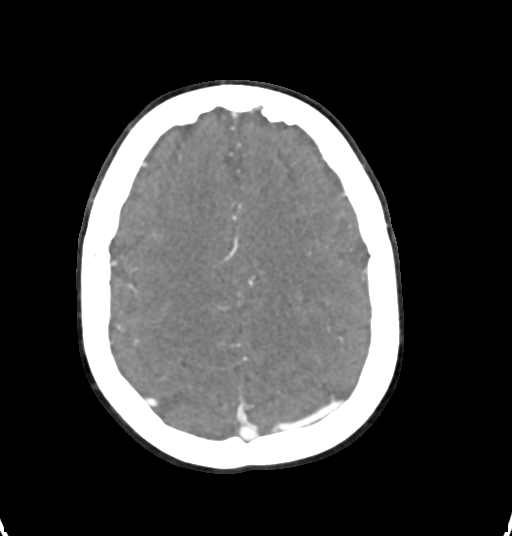

[Series 9: cta neck sagittal · sagittal · 0.45mm/px · 1 of 201 slices shown]
[im 114/201  soft-tissue]
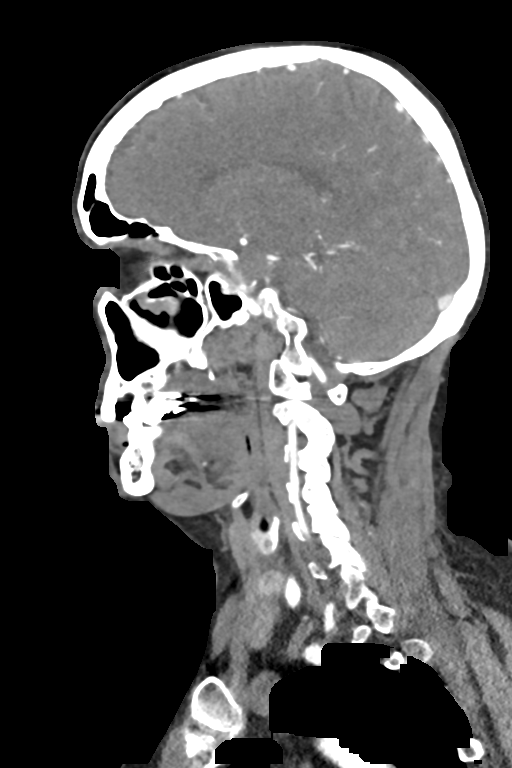

[6 of 35 positions shown; findings below may reference images not displayed]

FINDINGS: CT HEAD FINDINGS

Brain: No evidence of acute infarction, hemorrhage, hydrocephalus,
extra-axial collection or mass lesion/mass effect. Small chronic
lacunar infarcts are present in left thalamus and right putamen. Few
nonspecific foci of hypoattenuation in white matter are compatible
with mild chronic microvascular ischemic changes. Mild brain
parenchymal volume loss.

Vascular: As below.

Skull: Normal. Negative for fracture or focal lesion.

Sinuses: Mild paranasal sinus mucosal thickening with mucous
retention cysts in maxillary and ethmoid sinuses. Normal aeration of
right mastoid air cells. Sclerosis of left mastoid air cells
compatible with sequelae of chronic otomastoiditis.

Orbits: No acute finding.

Review of the MIP images confirms the above findings

CTA NECK FINDINGS

Aortic arch: Standard branching. Imaged portion shows no evidence of
aneurysm or dissection. No significant stenosis of the major arch
vessel origins. Mild calcific atherosclerosis.

Right carotid system: No evidence of dissection, stenosis (50% or
greater) or occlusion. Mild calcific atherosclerosis of carotid
bifurcation.

Left carotid system: No evidence of dissection, stenosis (50% or
greater) or occlusion. Mild calcific atherosclerosis of carotid
bifurcation.

Vertebral arteries: Codominant. No evidence of dissection, stenosis
(50% or greater) or occlusion.

Skeleton: Moderate cervical spondylosis greatest at the C5-C7
levels. C5-6 grade 1 anterolisthesis. C6-7 severe disc space
narrowing. No high-grade bony canal stenosis.

Other neck: Thyroid nodules measuring up to 17 mm and isthmus and 12
mm in left lobe, better characterized on prior thyroid ultrasound.

Upper chest: Negative.

Review of the MIP images confirms the above findings

CTA HEAD FINDINGS

Anterior circulation: No significant stenosis, proximal occlusion,
aneurysm, or vascular malformation. Mild calcific atherosclerosis of
carotid siphons with minimal less than 30% paraclinoid stenosis
bilaterally.

Posterior circulation: No significant stenosis, proximal occlusion,
aneurysm, or vascular malformation. Mild non stenotic left V4
segment calcification.

Venous sinuses: As permitted by contrast timing, patent.

Anatomic variants: Anterior communicating artery. No posterior
communicating artery identified, likely hypoplastic or absent.

Delayed phase: No abnormal intracranial enhancement.

Review of the MIP images confirms the above findings
IMPRESSION: 1. Patent carotid and vertebral arteries. No dissection, aneurysm,
or hemodynamically significant stenosis utilizing NASCET criteria.
2. Patent anterior and posterior intracranial circulation. No large
vessel occlusion, aneurysm, or significant stenosis.
3. Mild calcific atherosclerosis of aorta, carotid bifurcations, and
carotid siphons.
4. No acute intracranial abnormality.
5. Mild chronic microvascular ischemic changes of the brain, small
basal ganglia chronic lacunar infarcts, and mild brain parenchymal
volume loss.

By: Masnonah Mamat M.D.

## 2019-12-21 DIAGNOSIS — M25572 Pain in left ankle and joints of left foot: Secondary | ICD-10-CM | POA: Diagnosis not present

## 2019-12-21 DIAGNOSIS — S8262XD Displaced fracture of lateral malleolus of left fibula, subsequent encounter for closed fracture with routine healing: Secondary | ICD-10-CM | POA: Diagnosis not present

## 2019-12-24 DIAGNOSIS — E109 Type 1 diabetes mellitus without complications: Secondary | ICD-10-CM | POA: Diagnosis not present

## 2019-12-24 DIAGNOSIS — E1065 Type 1 diabetes mellitus with hyperglycemia: Secondary | ICD-10-CM | POA: Diagnosis not present

## 2019-12-24 DIAGNOSIS — Z794 Long term (current) use of insulin: Secondary | ICD-10-CM | POA: Diagnosis not present

## 2019-12-25 DIAGNOSIS — E1065 Type 1 diabetes mellitus with hyperglycemia: Secondary | ICD-10-CM | POA: Diagnosis not present

## 2019-12-25 DIAGNOSIS — Z794 Long term (current) use of insulin: Secondary | ICD-10-CM | POA: Diagnosis not present

## 2019-12-25 DIAGNOSIS — E109 Type 1 diabetes mellitus without complications: Secondary | ICD-10-CM | POA: Diagnosis not present

## 2019-12-28 DIAGNOSIS — M65322 Trigger finger, left index finger: Secondary | ICD-10-CM | POA: Diagnosis not present

## 2019-12-28 DIAGNOSIS — E109 Type 1 diabetes mellitus without complications: Secondary | ICD-10-CM | POA: Diagnosis not present

## 2020-01-09 DIAGNOSIS — M7502 Adhesive capsulitis of left shoulder: Secondary | ICD-10-CM | POA: Diagnosis not present

## 2020-01-21 DIAGNOSIS — M25512 Pain in left shoulder: Secondary | ICD-10-CM | POA: Diagnosis not present

## 2020-01-21 DIAGNOSIS — M25612 Stiffness of left shoulder, not elsewhere classified: Secondary | ICD-10-CM | POA: Diagnosis not present

## 2020-01-28 DIAGNOSIS — E109 Type 1 diabetes mellitus without complications: Secondary | ICD-10-CM | POA: Diagnosis not present

## 2020-01-28 DIAGNOSIS — Z7189 Other specified counseling: Secondary | ICD-10-CM | POA: Diagnosis not present

## 2020-01-28 DIAGNOSIS — Z794 Long term (current) use of insulin: Secondary | ICD-10-CM | POA: Diagnosis not present

## 2020-01-28 DIAGNOSIS — R32 Unspecified urinary incontinence: Secondary | ICD-10-CM | POA: Diagnosis not present

## 2020-01-30 DIAGNOSIS — J069 Acute upper respiratory infection, unspecified: Secondary | ICD-10-CM | POA: Diagnosis not present

## 2020-01-30 DIAGNOSIS — E785 Hyperlipidemia, unspecified: Secondary | ICD-10-CM | POA: Diagnosis not present

## 2020-01-30 DIAGNOSIS — N393 Stress incontinence (female) (male): Secondary | ICD-10-CM | POA: Diagnosis not present

## 2020-01-30 DIAGNOSIS — I10 Essential (primary) hypertension: Secondary | ICD-10-CM | POA: Diagnosis not present

## 2020-02-07 DIAGNOSIS — J019 Acute sinusitis, unspecified: Secondary | ICD-10-CM | POA: Diagnosis not present

## 2020-02-17 ENCOUNTER — Ambulatory Visit: Payer: BC Managed Care – PPO | Attending: Internal Medicine

## 2020-02-17 DIAGNOSIS — Z23 Encounter for immunization: Secondary | ICD-10-CM | POA: Insufficient documentation

## 2020-02-17 NOTE — Progress Notes (Signed)
   Covid-19 Vaccination Clinic  Name:  Kerry Compton    MRN: [id number] DOB: 28-May-1956  02/17/2020  [name] was observed post Covid-19 immunization for 15 minutes without incident. She was provided with Vaccine Information Sheet and instruction to access the V-Safe system.   [name] was instructed to call 911 with any severe reactions post vaccine: Marland Kitchen Difficulty breathing  . Swelling of face and throat  . A fast heartbeat  . A bad rash all over body  . Dizziness and weakness   Immunizations Administered    Name Date Dose VIS Date Route   Pfizer COVID-19 Vaccine 02/17/2020  1:54 PM 0.3 mL 11/23/2019 Intramuscular   Manufacturer: [employer], [employer]   Lot: [id number]   NDC: [address]-9

## 2020-02-19 DIAGNOSIS — M7502 Adhesive capsulitis of left shoulder: Secondary | ICD-10-CM | POA: Diagnosis not present

## 2020-02-27 ENCOUNTER — Other Ambulatory Visit: Payer: BC Managed Care – PPO

## 2020-02-29 DIAGNOSIS — H6981 Other specified disorders of Eustachian tube, right ear: Secondary | ICD-10-CM | POA: Diagnosis not present

## 2020-02-29 DIAGNOSIS — H9201 Otalgia, right ear: Secondary | ICD-10-CM | POA: Diagnosis not present

## 2020-03-06 DIAGNOSIS — I10 Essential (primary) hypertension: Secondary | ICD-10-CM | POA: Diagnosis not present

## 2020-03-06 DIAGNOSIS — H6981 Other specified disorders of Eustachian tube, right ear: Secondary | ICD-10-CM | POA: Diagnosis not present

## 2020-03-06 DIAGNOSIS — N3281 Overactive bladder: Secondary | ICD-10-CM | POA: Diagnosis not present

## 2020-03-19 ENCOUNTER — Ambulatory Visit: Payer: BC Managed Care – PPO | Attending: Internal Medicine

## 2020-03-19 DIAGNOSIS — Z23 Encounter for immunization: Secondary | ICD-10-CM

## 2020-03-19 NOTE — Progress Notes (Signed)
   Covid-19 Vaccination Clinic  Name:  Kerry Compton    MRN: [id number] DOB: 1956/08/31  03/19/2020  [name] was observed post Covid-19 immunization for 15 minutes without incident. She was provided with Vaccine Information Sheet and instruction to access the V-Safe system.   [name] was instructed to call 911 with any severe reactions post vaccine: Marland Kitchen Difficulty breathing  . Swelling of face and throat  . A fast heartbeat  . A bad rash all over body  . Dizziness and weakness   Immunizations Administered    Name Date Dose VIS Date Route   Pfizer COVID-19 Vaccine 03/19/2020  8:46 AM 0.3 mL 11/23/2019 Intramuscular   Manufacturer: [employer], [employer]   Lot: [id number]   NDC: [address]-5

## 2020-03-21 DIAGNOSIS — E109 Type 1 diabetes mellitus without complications: Secondary | ICD-10-CM | POA: Diagnosis not present

## 2020-03-21 DIAGNOSIS — Z794 Long term (current) use of insulin: Secondary | ICD-10-CM | POA: Diagnosis not present

## 2020-03-21 DIAGNOSIS — E1065 Type 1 diabetes mellitus with hyperglycemia: Secondary | ICD-10-CM | POA: Diagnosis not present

## 2020-04-08 DIAGNOSIS — E103293 Type 1 diabetes mellitus with mild nonproliferative diabetic retinopathy without macular edema, bilateral: Secondary | ICD-10-CM | POA: Diagnosis not present

## 2020-04-08 DIAGNOSIS — R079 Chest pain, unspecified: Secondary | ICD-10-CM | POA: Diagnosis not present

## 2020-04-08 DIAGNOSIS — I10 Essential (primary) hypertension: Secondary | ICD-10-CM | POA: Diagnosis not present

## 2020-04-08 DIAGNOSIS — K219 Gastro-esophageal reflux disease without esophagitis: Secondary | ICD-10-CM | POA: Diagnosis not present

## 2020-04-10 DIAGNOSIS — E1065 Type 1 diabetes mellitus with hyperglycemia: Secondary | ICD-10-CM | POA: Diagnosis not present

## 2020-04-10 DIAGNOSIS — Z794 Long term (current) use of insulin: Secondary | ICD-10-CM | POA: Diagnosis not present

## 2020-04-10 DIAGNOSIS — E109 Type 1 diabetes mellitus without complications: Secondary | ICD-10-CM | POA: Diagnosis not present

## 2020-04-23 ENCOUNTER — Encounter: Payer: Self-pay | Admitting: Family Medicine

## 2020-05-07 DIAGNOSIS — Z7189 Other specified counseling: Secondary | ICD-10-CM | POA: Insufficient documentation

## 2020-05-07 DIAGNOSIS — R072 Precordial pain: Secondary | ICD-10-CM | POA: Insufficient documentation

## 2020-05-07 NOTE — Progress Notes (Signed)
Cardiology Office Note   Date:  05/08/2020   ID:  Kerry Compton, DOB [birth year], MRN [id number]  PCP:  Laurann Montana, MD  Cardiologist:   No primary care provider on file. Referring:  Laurann Montana, MD  Chief Complaint  Patient presents with  . Chest Pain      History of Present Illness: Kerry Compton is a 64 y.o. female who is referred by Laurann Montana, MD for evaluation of chest pain.  Patient has no prior cardiac history though she does have significant cardiovascular risk factors.  She had chest discomfort a couple years ago that she felt was indigestion.  It happened after some fried food away with passing gas.  She really did not have a problem with this until more recently.  She is again associating with certain foods.  Lentil soup will bring it on.  If she avoids the foods that trigger it uses Alka-Seltzer or omeprazole over-the-counter she feels much better and does not have this discomfort.  She can do activities such as pushing a mower and lifting on the symptoms.  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  She walks her dog for exercise.  With this she does not get substernal chest pressure, neck or arm discomfort.  She does not have any associated nausea vomiting or diaphoresis.  She does not have any shortness of breath, PND or orthopnea.   Past Medical History:  Diagnosis Date  . Allergy   . Asthma   . Diabetes mellitus   . 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, no current liver problems  . Hyperlipemia   . Hypertension     Past Surgical History:  Procedure Laterality Date  . COLONOSCOPY WITH PROPOFOL N/A 06/18/2014   Procedure: COLONOSCOPY WITH PROPOFOL;  Surgeon: Charolett Bumpers, MD;  Location: [facility];  Service: Endoscopy;  Laterality: N/A;  . ROTATOR CUFF REPAIR Right 10 yrs ago  . SEPTOPLASTY    . TUBAL LIGATION       Current Outpatient Medications  Medication Sig Dispense Refill  .  albuterol (PROVENTIL HFA;VENTOLIN HFA) 108 (90 BASE) MCG/ACT inhaler Inhale into the lungs every 6 (six) hours as needed for wheezing or shortness of breath.    Marland Kitchen atorvastatin (LIPITOR) 80 MG tablet Take 80 mg by mouth daily.      . fexofenadine (ALLEGRA) 180 MG tablet Take 180 mg by mouth daily.    . fluticasone (FLOVENT HFA) 44 MCG/ACT inhaler Inhale 1 puff into the lungs 2 (two) times daily.    . insulin glargine (LANTUS) 100 UNIT/ML injection Inject 15 Units into the skin daily as needed (if bs is too high).     . Insulin Human (INSULIN PUMP) 100 unit/ml SOLN Inject into the skin continuous. Humalog insulin pump basal rate 1200 am to 300 am = 1.0 600 am to 600 pm = 0.90    . Multiple Vitamin (MULTIVITAMIN WITH MINERALS) TABS tablet Take 1 tablet by mouth daily.    . valsartan (DIOVAN) 320 MG tablet Take 320 mg by mouth daily.     No current facility-administered medications for this visit.    Allergies:   Azithromycin and Indocin [indomethacin]    Social History:  The patient  reports that she has never smoked. She has never used smokeless tobacco. She reports that she does not drink alcohol or use drugs.   Family History:  The patient's family history includes  Arthritis in her mother; Asthma in her mother; Diabetes in her brother, brother, maternal grandmother, and mother; Hyperlipidemia in her brother; Hypertension in her brother.    ROS:  Please see the history of present illness.   Otherwise, review of systems are positive for none.   All other systems are reviewed and negative.    PHYSICAL EXAM: VS:  BP 125/70   Pulse 88   Temp (!) 95.2 F (35.1 C)   Ht 5\' 5"  (1.651 m)   Wt 221 lb 6.4 oz (100.4 kg)   SpO2 93%   BMI 36.84 kg/m  , BMI Body mass index is 36.84 kg/m. GENERAL:  Well appearing HEENT:  Pupils equal round and reactive, fundi not visualized, oral mucosa unremarkable NECK:  No jugular venous distention, waveform within normal limits, carotid upstroke brisk and  symmetric, no bruits, no thyromegaly LYMPHATICS:  No cervical, inguinal adenopathy LUNGS:  Clear to auscultation bilaterally BACK:  No CVA tenderness CHEST:  Unremarkable HEART:  PMI not displaced or sustained,S1 and S2 within normal limits, no S3, no S4, no clicks, no rubs, no murmurs ABD:  Flat, positive bowel sounds normal in frequency in pitch, no bruits, no rebound, no guarding, no midline pulsatile mass, no hepatomegaly, no splenomegaly EXT:  2 plus pulses throughout, no edema, no cyanosis no clubbing SKIN:  No rashes no nodules NEURO:  Cranial nerves II through XII grossly intact, motor grossly intact throughout PSYCH:  Cognitively intact, oriented to person place and time    EKG:  EKG is ordered today. The ekg ordered today demonstrates sinus rhythm, rate 88, axis within normal limits, intervals within normal limits, no acute ST-T wave changes.   Recent Labs: No results found for requested labs within last 8760 hours.    Lipid Panel No results found for: CHOL, TRIG, HDL, CHOLHDL, VLDL, LDLCALC, LDLDIRECT    Wt Readings from Last 3 Encounters:  05/08/20 221 lb 6.4 oz (100.4 kg)  05/18/18 200 lb (90.7 kg)  02/07/18 180 lb (81.6 kg)      Other studies Reviewed: Additional studies/ records that were reviewed today include: Office records, labs. Review of the above records demonstrates:  Please see elsewhere in the note.     ASSESSMENT AND PLAN:  CHEST PAIN: Her chest pain is atypical.  However, she does have significant cardiovascular risk factors. I will bring the patient back for a POET (Plain Old Exercise Test). This will allow me to screen for obstructive coronary disease, risk stratify and very importantly provide a prescription for exercise.  HTN: At target.  No change in therapy.  DYSLIPIDEMIA: Her LDL is not at target.  I would suggest switching to Crestor but she will not do this because her brothers had problems with it.  DM:  A1c was 7.6.  However, she is  followed by Dr. Buddy Duty and I will defer management to him.  COVID EDUCATION: She has had her vaccine.  Current medicines are reviewed at length with the patient today.  The patient does not have concerns regarding medicines.  The following changes have been made:  no change  Labs/ tests ordered today include:   Orders Placed This Encounter  Procedures  . EXERCISE TOLERANCE TEST (ETT)  . EKG 12-Lead     Disposition:   FU with me as needed.      Signed, Minus Breeding, MD  05/08/2020 10:39 PM    [facility]

## 2020-05-08 ENCOUNTER — Encounter: Payer: Self-pay | Admitting: Cardiology

## 2020-05-08 ENCOUNTER — Ambulatory Visit (INDEPENDENT_AMBULATORY_CARE_PROVIDER_SITE_OTHER): Payer: BC Managed Care – PPO | Admitting: Cardiology

## 2020-05-08 ENCOUNTER — Other Ambulatory Visit: Payer: Self-pay

## 2020-05-08 VITALS — BP 125/70 | HR 88 | Temp 95.2°F | Ht 65.0 in | Wt 221.4 lb

## 2020-05-08 DIAGNOSIS — R072 Precordial pain: Secondary | ICD-10-CM

## 2020-05-08 DIAGNOSIS — Z7189 Other specified counseling: Secondary | ICD-10-CM | POA: Diagnosis not present

## 2020-05-08 DIAGNOSIS — E785 Hyperlipidemia, unspecified: Secondary | ICD-10-CM

## 2020-05-08 DIAGNOSIS — I10 Essential (primary) hypertension: Secondary | ICD-10-CM

## 2020-05-08 NOTE — Patient Instructions (Signed)
Medication Instructions: NO CHANGES *If you need a refill on your cardiac medications before your next appointment, please call your pharmacy*  Lab Work: You will need a Covid Screening 3 days prior to your exercise tolerance test. You will need to self quarantine after the screening until your procedure.This is a Drive Up Visit at the [facility] [address], [place]. Someone will direct you to the appropriate testing line. Stay in your car and someone will be with you shortly  Testing/Procedures: Your physician has requested that you have an exercise tolerance test. For further information please visit [email or web address]. Please also follow instruction sheet, as given.  Follow-Up: At [facility], you and your health needs are our priority.  As part of our continuing mission to provide you with exceptional heart care, we have created designated Provider Care Teams.  These Care Teams include your primary Cardiologist (physician) and Advanced Practice Providers (APPs -  Physician Assistants and Nurse Practitioners) who all work together to provide you with the care you need, when you need it.  Your next appointment:   FOLLOW UP AS NEEDED 

## 2020-05-26 ENCOUNTER — Other Ambulatory Visit (HOSPITAL_COMMUNITY): Payer: BC Managed Care – PPO

## 2020-05-27 ENCOUNTER — Telehealth (HOSPITAL_COMMUNITY): Payer: Self-pay

## 2020-05-27 NOTE — Telephone Encounter (Signed)
Encounter complete. 

## 2020-05-29 ENCOUNTER — Ambulatory Visit (HOSPITAL_COMMUNITY)
Admission: RE | Admit: 2020-05-29 | Discharge: 2020-05-29 | Disposition: A | Discharge status: Home / Self Care | Payer: BC Managed Care – PPO | Source: Ambulatory Visit | Attending: Cardiology | Admitting: Cardiology

## 2020-05-29 ENCOUNTER — Other Ambulatory Visit: Payer: Self-pay

## 2020-05-29 DIAGNOSIS — Z794 Long term (current) use of insulin: Secondary | ICD-10-CM | POA: Diagnosis not present

## 2020-05-29 DIAGNOSIS — E1065 Type 1 diabetes mellitus with hyperglycemia: Secondary | ICD-10-CM | POA: Diagnosis not present

## 2020-05-29 DIAGNOSIS — R072 Precordial pain: Secondary | ICD-10-CM | POA: Diagnosis not present

## 2020-05-29 LAB — EXERCISE TOLERANCE TEST
Estimated workload: 8.5 METS
Exercise duration (min): 7 min
Exercise duration (sec): 1 s
MPHR: 157 {beats}/min
Peak HR: 155 {beats}/min
Percent HR: 98 %
Rest HR: 85 {beats}/min

## 2020-06-15 ENCOUNTER — Other Ambulatory Visit: Payer: Self-pay

## 2020-06-15 ENCOUNTER — Encounter (HOSPITAL_COMMUNITY): Payer: Self-pay

## 2020-06-15 ENCOUNTER — Emergency Department (HOSPITAL_COMMUNITY)
Admission: EM | Admit: 2020-06-15 | Discharge: 2020-06-15 | Disposition: A | Discharge status: Home / Self Care | Payer: BC Managed Care – PPO | Attending: Emergency Medicine | Admitting: Emergency Medicine

## 2020-06-15 DIAGNOSIS — Y999 Unspecified external cause status: Secondary | ICD-10-CM | POA: Diagnosis not present

## 2020-06-15 DIAGNOSIS — Y929 Unspecified place or not applicable: Secondary | ICD-10-CM | POA: Insufficient documentation

## 2020-06-15 DIAGNOSIS — E1010 Type 1 diabetes mellitus with ketoacidosis without coma: Secondary | ICD-10-CM | POA: Insufficient documentation

## 2020-06-15 DIAGNOSIS — Z23 Encounter for immunization: Secondary | ICD-10-CM | POA: Diagnosis not present

## 2020-06-15 DIAGNOSIS — Y939 Activity, unspecified: Secondary | ICD-10-CM | POA: Diagnosis not present

## 2020-06-15 DIAGNOSIS — I10 Essential (primary) hypertension: Secondary | ICD-10-CM | POA: Insufficient documentation

## 2020-06-15 DIAGNOSIS — J45909 Unspecified asthma, uncomplicated: Secondary | ICD-10-CM | POA: Insufficient documentation

## 2020-06-15 DIAGNOSIS — W540XXA Bitten by dog, initial encounter: Secondary | ICD-10-CM | POA: Insufficient documentation

## 2020-06-15 DIAGNOSIS — Z794 Long term (current) use of insulin: Secondary | ICD-10-CM | POA: Insufficient documentation

## 2020-06-15 DIAGNOSIS — S71151A Open bite, right thigh, initial encounter: Secondary | ICD-10-CM | POA: Insufficient documentation

## 2020-06-15 DIAGNOSIS — Z7951 Long term (current) use of inhaled steroids: Secondary | ICD-10-CM | POA: Diagnosis not present

## 2020-06-15 DIAGNOSIS — Z79899 Other long term (current) drug therapy: Secondary | ICD-10-CM | POA: Insufficient documentation

## 2020-06-15 MED ORDER — AMOXICILLIN-POT CLAVULANATE 875-125 MG PO TABS: 1.0000 | ORAL_TABLET | Freq: Two times a day (BID) | ORAL | 0 refills | Status: DC

## 2020-06-15 MED ORDER — TETANUS-DIPHTH-ACELL PERTUSSIS 5-2.5-18.5 LF-MCG/0.5 IM SUSP
0.5000 mL | Freq: Once | INTRAMUSCULAR | Status: AC
  Administered 2020-06-15: 0.5 mL via INTRAMUSCULAR
  Filled 2020-06-15: qty 0.5

## 2020-06-15 MED ORDER — AMOXICILLIN-POT CLAVULANATE 875-125 MG PO TABS
1.0000 | ORAL_TABLET | Freq: Once | ORAL | Status: AC
  Administered 2020-06-15: 1 via ORAL
  Filled 2020-06-15: qty 1

## 2020-06-15 NOTE — ED Provider Notes (Signed)
[facility]-EMERGENCY DEPT Provider Note   CSN: [id number] Arrival date & time: 06/15/20  1432     History Chief Complaint  Patient presents with  . Animal Bite    Kerry Compton is a 64 y.o. female with past medical history of hypertension, type 1 diabetes, presenting to the emergency department with complaint of dog bite to her right posterior thigh that occurred prior to arrival.  She states her neighbor's new dog bit her on her leg today.  She has some associated bruising and pain.  She states her neighbor has not yet updated the dog's rabies vaccine.  She states her blood sugars have been controlled well, she has an insulin pump and is compliant with her treatment.  The history is provided by the patient.       Past Medical History:  Diagnosis Date  . Allergy   . Asthma   . Diabetes mellitus   . 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, no current liver problems  . Hyperlipemia   . Hypertension     Patient Active Problem List   Diagnosis Date Noted  . Precordial chest pain 05/07/2020  . Educated about COVID-19 virus infection 05/07/2020  . Asthma 08/28/2015  . Hyperkalemia 08/27/2015  . Diabetic ketoacidosis without coma associated with diabetes mellitus due to underlying condition (HCC)   . Leukocytosis 02/09/2015  . Acute renal failure syndrome (HCC)   . DKA (diabetic ketoacidoses) (HCC) 02/08/2015  . Gastroenteritis and colitis, viral 02/08/2015  . AKI (acute kidney injury) (HCC) 02/08/2015  . Nausea vomiting and diarrhea 02/08/2015  . Type 1 diabetes mellitus (HCC) 03/19/2013  . Hyperlipidemia 03/19/2013  . Asthma, chronic 03/19/2013  . Hypertension 03/19/2013    Past Surgical History:  Procedure Laterality Date  . COLONOSCOPY WITH PROPOFOL N/A 06/18/2014   Procedure: COLONOSCOPY WITH PROPOFOL;  Surgeon: Charolett Bumpers, MD;  Location: [facility];  Service: Endoscopy;  Laterality: N/A;  . ROTATOR CUFF REPAIR Right 10 yrs ago  .  SEPTOPLASTY    . TUBAL LIGATION       OB History   No obstetric history on file.     Family History  Problem Relation Age of Onset  . Diabetes Mother   . Asthma Mother   . Arthritis Mother   . Diabetes Brother   . Diabetes Maternal Grandmother   . Diabetes Brother   . Hypertension Brother   . Hyperlipidemia Brother     Social History   Tobacco Use  . Smoking status: Never Smoker  . Smokeless tobacco: Never Used  Vaping Use  . Vaping Use: Never used  Substance Use Topics  . Alcohol use: No    Alcohol/week: 0.0 standard drinks  . Drug use: No    Home Medications Prior to Admission medications   Medication Sig Start Date End Date Taking? Authorizing Provider  albuterol (PROVENTIL HFA;VENTOLIN HFA) 108 (90 BASE) MCG/ACT inhaler Inhale into the lungs every 6 (six) hours as needed for wheezing or shortness of breath.    [provider]  amoxicillin-clavulanate (AUGMENTIN) 875-125 MG tablet Take 1 tablet by mouth every 12 (twelve) hours. 06/15/20   Canesha Tesfaye, [place] N, PA-C  atorvastatin (LIPITOR) 80 MG tablet Take 80 mg by mouth daily.      [provider]  fexofenadine (ALLEGRA) 180 MG tablet Take 180 mg by mouth daily.    [provider]  fluticasone (FLOVENT HFA) 44 MCG/ACT inhaler Inhale 1 puff into the lungs 2 (two) times  daily.    [provider]  insulin glargine (LANTUS) 100 UNIT/ML injection Inject 15 Units into the skin daily as needed (if bs is too high).     [provider]  Insulin Human (INSULIN PUMP) 100 unit/ml SOLN Inject into the skin continuous. Humalog insulin pump basal rate 1200 am to 300 am = 1.0 600 am to 600 pm = 0.90    [provider]  Multiple Vitamin (MULTIVITAMIN WITH MINERALS) TABS tablet Take 1 tablet by mouth daily.    [provider]  valsartan (DIOVAN) 320 MG tablet Take 320 mg by mouth daily.    [provider]    Allergies    Azithromycin and Indocin  [indomethacin]  Review of Systems   Review of Systems  All other systems reviewed and are negative.   Physical Exam Updated Vital Signs BP (!) 163/87 (BP Location: Right Arm)   Pulse (!) 101   Temp 98.5 F (36.9 C) (Oral)   Resp 18   Ht 5\' 5"  (1.651 m)   Wt 97.5 kg   SpO2 97%   BMI 35.78 kg/m   Physical Exam Vitals and nursing note reviewed.  Constitutional:      Appearance: She is well-developed.  HENT:     Head: Normocephalic and atraumatic.  Eyes:     Conjunctiva/sclera: Conjunctivae normal.  Cardiovascular:     Rate and Rhythm: Normal rate and regular rhythm.  Pulmonary:     Effort: Pulmonary effort is normal.     Breath sounds: Normal breath sounds.  Skin:    Comments: Right distal posterior thigh with 2 small puncture wounds within a superficial abrasion about quarter-sized, and surrounding ecchymosis.  No obvious retained foreign body.  Not actively bleeding.  Neurological:     Mental Status: She is alert.  Psychiatric:        Mood and Affect: Mood normal.        Behavior: Behavior normal.       ED Results / Procedures / Treatments   Labs (all labs ordered are listed, but only abnormal results are displayed) Labs Reviewed - No data to display  EKG None  Radiology No results found.  Procedures Procedures (including critical care time)  Medications Ordered in ED Medications  Tdap (BOOSTRIX) injection 0.5 mL (0.5 mLs Intramuscular Given 06/15/20 1525)  amoxicillin-clavulanate (AUGMENTIN) 875-125 MG per tablet 1 tablet (1 tablet Oral Given 06/15/20 1527)    ED Course  I have reviewed the triage vital signs and the nursing notes.  Pertinent labs & imaging results that were available during my care of the patient were reviewed by me and considered in my medical decision making (see chart for details).    MDM Rules/Calculators/A&P                          Patient presents with superficial laceration/puncture wounds from a dog bite.  Pt wounds  irrigated well and examined with visualization of the base and no foreign bodies seen. Pt is well-appearing, no distress on evaluation, though triage nurse felt pt was somewhat anxious on arrival, contributing to initial tachycardia, improved on recheck without intervention. Patient tetanus updated.  Patient rabies vaccine and immunoglobulin risk and benefit discussed.  Patient states the dog lives next door, and she would like to contact animal control for animal quarantine prior to receiving any rabies prophylaxis. Animal control contacted. Discussed risk of infection is increased as patient is insulin-dependent diabetic.  Instructed  importance of monitoring blood sugars and monitoring wound for signs of infection.  Close follow-up in 2 days for recheck, return to the ER sooner for signs of infection.   Final Clinical Impression(s) / ED Diagnoses Final diagnoses:  Dog bite, initial encounter    Rx / DC Orders ED Discharge Orders         Ordered    amoxicillin-clavulanate (AUGMENTIN) 875-125 MG tablet  Every 12 hours     Discontinue  Reprint     06/15/20 1542           Mintie Witherington, [place] N, [place] 06/15/20 1932    Derwood Kaplan, MD 06/16/20 [phone]

## 2020-06-15 NOTE — Discharge Instructions (Addendum)
Keep your wounds clean and covered. Gently wash your wounds with soap and water daily. Take the antibiotic, Augmentin, as prescribed until completely gone. You can take tylenol every 6 hours as needed for pain and swelling. Animal control should contact you regarding quarantine for the animal to watch for signs of rabies. If you would like to start rabies prophylaxis injections, please report back to the ED for initial vaccine, then to urgent care for subsequent vaccines. Return to the emergency department for signs of infection which include fever, increasing redness surrounding your wound, or pus draining from your wound.

## 2020-06-15 NOTE — ED Triage Notes (Addendum)
Pt reports dog bite to right posterior thigh that occurred about 45 minutes ago. Dogs owner states that the dog has not yet received rabies vaccinations.

## 2020-06-23 DIAGNOSIS — E1065 Type 1 diabetes mellitus with hyperglycemia: Secondary | ICD-10-CM | POA: Diagnosis not present

## 2020-06-23 DIAGNOSIS — Z794 Long term (current) use of insulin: Secondary | ICD-10-CM | POA: Diagnosis not present

## 2020-06-23 DIAGNOSIS — E109 Type 1 diabetes mellitus without complications: Secondary | ICD-10-CM | POA: Diagnosis not present

## 2020-07-11 DIAGNOSIS — E109 Type 1 diabetes mellitus without complications: Secondary | ICD-10-CM | POA: Diagnosis not present

## 2020-07-11 DIAGNOSIS — E1065 Type 1 diabetes mellitus with hyperglycemia: Secondary | ICD-10-CM | POA: Diagnosis not present

## 2020-07-11 DIAGNOSIS — Z794 Long term (current) use of insulin: Secondary | ICD-10-CM | POA: Diagnosis not present

## 2020-07-23 DIAGNOSIS — E1065 Type 1 diabetes mellitus with hyperglycemia: Secondary | ICD-10-CM | POA: Diagnosis not present

## 2020-07-23 DIAGNOSIS — Z794 Long term (current) use of insulin: Secondary | ICD-10-CM | POA: Diagnosis not present

## 2020-07-23 DIAGNOSIS — E109 Type 1 diabetes mellitus without complications: Secondary | ICD-10-CM | POA: Diagnosis not present

## 2020-08-29 DIAGNOSIS — I10 Essential (primary) hypertension: Secondary | ICD-10-CM | POA: Diagnosis not present

## 2020-08-29 DIAGNOSIS — E109 Type 1 diabetes mellitus without complications: Secondary | ICD-10-CM | POA: Diagnosis not present

## 2020-08-29 DIAGNOSIS — Z794 Long term (current) use of insulin: Secondary | ICD-10-CM | POA: Diagnosis not present

## 2020-08-29 DIAGNOSIS — E1165 Type 2 diabetes mellitus with hyperglycemia: Secondary | ICD-10-CM | POA: Diagnosis not present

## 2020-09-09 DIAGNOSIS — R300 Dysuria: Secondary | ICD-10-CM | POA: Diagnosis not present

## 2020-09-22 DIAGNOSIS — H2513 Age-related nuclear cataract, bilateral: Secondary | ICD-10-CM | POA: Diagnosis not present

## 2020-09-22 DIAGNOSIS — E103293 Type 1 diabetes mellitus with mild nonproliferative diabetic retinopathy without macular edema, bilateral: Secondary | ICD-10-CM | POA: Diagnosis not present

## 2020-10-02 DIAGNOSIS — E109 Type 1 diabetes mellitus without complications: Secondary | ICD-10-CM | POA: Diagnosis not present

## 2020-10-02 DIAGNOSIS — Z794 Long term (current) use of insulin: Secondary | ICD-10-CM | POA: Diagnosis not present

## 2020-10-02 DIAGNOSIS — E1065 Type 1 diabetes mellitus with hyperglycemia: Secondary | ICD-10-CM | POA: Diagnosis not present

## 2020-10-03 ENCOUNTER — Other Ambulatory Visit (HOSPITAL_COMMUNITY)
Admission: RE | Admit: 2020-10-03 | Discharge: 2020-10-03 | Disposition: A | Discharge status: Home / Self Care | Payer: BC Managed Care – PPO | Source: Ambulatory Visit | Attending: Family Medicine | Admitting: Family Medicine

## 2020-10-03 ENCOUNTER — Other Ambulatory Visit: Payer: Self-pay | Admitting: Family Medicine

## 2020-10-03 DIAGNOSIS — Z124 Encounter for screening for malignant neoplasm of cervix: Secondary | ICD-10-CM | POA: Diagnosis not present

## 2020-10-03 DIAGNOSIS — E042 Nontoxic multinodular goiter: Secondary | ICD-10-CM | POA: Diagnosis not present

## 2020-10-03 DIAGNOSIS — I10 Essential (primary) hypertension: Secondary | ICD-10-CM | POA: Diagnosis not present

## 2020-10-03 DIAGNOSIS — Z0000 Encounter for general adult medical examination without abnormal findings: Secondary | ICD-10-CM | POA: Diagnosis not present

## 2020-10-03 DIAGNOSIS — E785 Hyperlipidemia, unspecified: Secondary | ICD-10-CM | POA: Diagnosis not present

## 2020-10-03 DIAGNOSIS — E103293 Type 1 diabetes mellitus with mild nonproliferative diabetic retinopathy without macular edema, bilateral: Secondary | ICD-10-CM | POA: Diagnosis not present

## 2020-10-06 LAB — CYTOLOGY - PAP
Comment: NEGATIVE
Diagnosis: NEGATIVE
High risk HPV: NEGATIVE

## 2020-10-17 DIAGNOSIS — Z794 Long term (current) use of insulin: Secondary | ICD-10-CM | POA: Diagnosis not present

## 2020-10-17 DIAGNOSIS — E109 Type 1 diabetes mellitus without complications: Secondary | ICD-10-CM | POA: Diagnosis not present

## 2020-10-17 DIAGNOSIS — E1065 Type 1 diabetes mellitus with hyperglycemia: Secondary | ICD-10-CM | POA: Diagnosis not present

## 2020-10-28 DIAGNOSIS — H2511 Age-related nuclear cataract, right eye: Secondary | ICD-10-CM | POA: Diagnosis not present

## 2020-10-28 DIAGNOSIS — Z1231 Encounter for screening mammogram for malignant neoplasm of breast: Secondary | ICD-10-CM | POA: Diagnosis not present

## 2020-10-28 DIAGNOSIS — H18413 Arcus senilis, bilateral: Secondary | ICD-10-CM | POA: Diagnosis not present

## 2020-10-28 DIAGNOSIS — H25043 Posterior subcapsular polar age-related cataract, bilateral: Secondary | ICD-10-CM | POA: Diagnosis not present

## 2020-10-28 DIAGNOSIS — H2513 Age-related nuclear cataract, bilateral: Secondary | ICD-10-CM | POA: Diagnosis not present

## 2020-10-28 DIAGNOSIS — H25013 Cortical age-related cataract, bilateral: Secondary | ICD-10-CM | POA: Diagnosis not present

## 2020-11-04 DIAGNOSIS — M85852 Other specified disorders of bone density and structure, left thigh: Secondary | ICD-10-CM | POA: Diagnosis not present

## 2020-11-04 DIAGNOSIS — M8589 Other specified disorders of bone density and structure, multiple sites: Secondary | ICD-10-CM | POA: Diagnosis not present

## 2020-11-28 DIAGNOSIS — H2512 Age-related nuclear cataract, left eye: Secondary | ICD-10-CM | POA: Diagnosis not present

## 2020-11-28 DIAGNOSIS — H2511 Age-related nuclear cataract, right eye: Secondary | ICD-10-CM | POA: Diagnosis not present

## 2020-12-05 DIAGNOSIS — E109 Type 1 diabetes mellitus without complications: Secondary | ICD-10-CM | POA: Diagnosis not present

## 2020-12-05 DIAGNOSIS — Z794 Long term (current) use of insulin: Secondary | ICD-10-CM | POA: Diagnosis not present

## 2020-12-05 DIAGNOSIS — E1065 Type 1 diabetes mellitus with hyperglycemia: Secondary | ICD-10-CM | POA: Diagnosis not present

## 2020-12-12 DIAGNOSIS — H2512 Age-related nuclear cataract, left eye: Secondary | ICD-10-CM | POA: Diagnosis not present

## 2021-01-09 DIAGNOSIS — R413 Other amnesia: Secondary | ICD-10-CM | POA: Diagnosis not present

## 2021-01-09 DIAGNOSIS — I10 Essential (primary) hypertension: Secondary | ICD-10-CM | POA: Diagnosis not present

## 2021-01-09 DIAGNOSIS — Z794 Long term (current) use of insulin: Secondary | ICD-10-CM | POA: Diagnosis not present

## 2021-01-09 DIAGNOSIS — E1165 Type 2 diabetes mellitus with hyperglycemia: Secondary | ICD-10-CM | POA: Diagnosis not present

## 2021-01-09 DIAGNOSIS — E538 Deficiency of other specified B group vitamins: Secondary | ICD-10-CM | POA: Diagnosis not present

## 2021-01-09 DIAGNOSIS — E109 Type 1 diabetes mellitus without complications: Secondary | ICD-10-CM | POA: Diagnosis not present

## 2021-02-05 DIAGNOSIS — E109 Type 1 diabetes mellitus without complications: Secondary | ICD-10-CM | POA: Diagnosis not present

## 2021-02-05 DIAGNOSIS — E1065 Type 1 diabetes mellitus with hyperglycemia: Secondary | ICD-10-CM | POA: Diagnosis not present

## 2021-02-05 DIAGNOSIS — Z794 Long term (current) use of insulin: Secondary | ICD-10-CM | POA: Diagnosis not present

## 2021-02-12 DIAGNOSIS — E538 Deficiency of other specified B group vitamins: Secondary | ICD-10-CM | POA: Diagnosis not present

## 2021-03-26 DIAGNOSIS — E1065 Type 1 diabetes mellitus with hyperglycemia: Secondary | ICD-10-CM | POA: Diagnosis not present

## 2021-03-26 DIAGNOSIS — E109 Type 1 diabetes mellitus without complications: Secondary | ICD-10-CM | POA: Diagnosis not present

## 2021-03-26 DIAGNOSIS — Z794 Long term (current) use of insulin: Secondary | ICD-10-CM | POA: Diagnosis not present

## 2021-04-03 DIAGNOSIS — I10 Essential (primary) hypertension: Secondary | ICD-10-CM | POA: Diagnosis not present

## 2021-04-03 DIAGNOSIS — J309 Allergic rhinitis, unspecified: Secondary | ICD-10-CM | POA: Diagnosis not present

## 2021-04-03 DIAGNOSIS — Z03818 Encounter for observation for suspected exposure to other biological agents ruled out: Secondary | ICD-10-CM | POA: Diagnosis not present

## 2021-04-03 DIAGNOSIS — E785 Hyperlipidemia, unspecified: Secondary | ICD-10-CM | POA: Diagnosis not present

## 2021-04-03 DIAGNOSIS — R0981 Nasal congestion: Secondary | ICD-10-CM | POA: Diagnosis not present

## 2021-04-24 DIAGNOSIS — E1165 Type 2 diabetes mellitus with hyperglycemia: Secondary | ICD-10-CM | POA: Diagnosis not present

## 2021-04-24 DIAGNOSIS — I10 Essential (primary) hypertension: Secondary | ICD-10-CM | POA: Diagnosis not present

## 2021-04-24 DIAGNOSIS — E109 Type 1 diabetes mellitus without complications: Secondary | ICD-10-CM | POA: Diagnosis not present

## 2021-04-24 DIAGNOSIS — Z794 Long term (current) use of insulin: Secondary | ICD-10-CM | POA: Diagnosis not present

## 2021-04-24 DIAGNOSIS — Z8639 Personal history of other endocrine, nutritional and metabolic disease: Secondary | ICD-10-CM | POA: Diagnosis not present

## 2021-05-01 DIAGNOSIS — Z794 Long term (current) use of insulin: Secondary | ICD-10-CM | POA: Diagnosis not present

## 2021-05-01 DIAGNOSIS — E109 Type 1 diabetes mellitus without complications: Secondary | ICD-10-CM | POA: Diagnosis not present

## 2021-05-01 DIAGNOSIS — E1065 Type 1 diabetes mellitus with hyperglycemia: Secondary | ICD-10-CM | POA: Diagnosis not present

## 2021-06-30 DIAGNOSIS — E1065 Type 1 diabetes mellitus with hyperglycemia: Secondary | ICD-10-CM | POA: Diagnosis not present

## 2021-06-30 DIAGNOSIS — E109 Type 1 diabetes mellitus without complications: Secondary | ICD-10-CM | POA: Diagnosis not present

## 2021-06-30 DIAGNOSIS — Z794 Long term (current) use of insulin: Secondary | ICD-10-CM | POA: Diagnosis not present

## 2021-07-09 DIAGNOSIS — Z961 Presence of intraocular lens: Secondary | ICD-10-CM | POA: Diagnosis not present

## 2021-07-09 DIAGNOSIS — H43811 Vitreous degeneration, right eye: Secondary | ICD-10-CM | POA: Diagnosis not present

## 2021-07-09 DIAGNOSIS — E103293 Type 1 diabetes mellitus with mild nonproliferative diabetic retinopathy without macular edema, bilateral: Secondary | ICD-10-CM | POA: Diagnosis not present

## 2021-07-14 DIAGNOSIS — E1065 Type 1 diabetes mellitus with hyperglycemia: Secondary | ICD-10-CM | POA: Diagnosis not present

## 2021-07-14 DIAGNOSIS — Z794 Long term (current) use of insulin: Secondary | ICD-10-CM | POA: Diagnosis not present

## 2021-07-14 DIAGNOSIS — E109 Type 1 diabetes mellitus without complications: Secondary | ICD-10-CM | POA: Diagnosis not present

## 2021-08-13 DIAGNOSIS — E109 Type 1 diabetes mellitus without complications: Secondary | ICD-10-CM | POA: Diagnosis not present

## 2021-08-13 DIAGNOSIS — E1065 Type 1 diabetes mellitus with hyperglycemia: Secondary | ICD-10-CM | POA: Diagnosis not present

## 2021-08-13 DIAGNOSIS — Z794 Long term (current) use of insulin: Secondary | ICD-10-CM | POA: Diagnosis not present

## 2021-09-03 DIAGNOSIS — N3001 Acute cystitis with hematuria: Secondary | ICD-10-CM | POA: Diagnosis not present

## 2021-09-03 DIAGNOSIS — R399 Unspecified symptoms and signs involving the genitourinary system: Secondary | ICD-10-CM | POA: Diagnosis not present

## 2021-10-27 DIAGNOSIS — Z8639 Personal history of other endocrine, nutritional and metabolic disease: Secondary | ICD-10-CM | POA: Diagnosis not present

## 2021-10-27 DIAGNOSIS — Z794 Long term (current) use of insulin: Secondary | ICD-10-CM | POA: Diagnosis not present

## 2021-10-27 DIAGNOSIS — E1165 Type 2 diabetes mellitus with hyperglycemia: Secondary | ICD-10-CM | POA: Diagnosis not present

## 2021-10-27 DIAGNOSIS — I10 Essential (primary) hypertension: Secondary | ICD-10-CM | POA: Diagnosis not present

## 2021-10-27 DIAGNOSIS — E109 Type 1 diabetes mellitus without complications: Secondary | ICD-10-CM | POA: Diagnosis not present

## 2021-11-06 DIAGNOSIS — E1065 Type 1 diabetes mellitus with hyperglycemia: Secondary | ICD-10-CM | POA: Diagnosis not present

## 2021-11-06 DIAGNOSIS — E109 Type 1 diabetes mellitus without complications: Secondary | ICD-10-CM | POA: Diagnosis not present

## 2021-11-06 DIAGNOSIS — Z794 Long term (current) use of insulin: Secondary | ICD-10-CM | POA: Diagnosis not present

## 2021-11-13 DIAGNOSIS — E1065 Type 1 diabetes mellitus with hyperglycemia: Secondary | ICD-10-CM | POA: Diagnosis not present

## 2021-11-13 DIAGNOSIS — E109 Type 1 diabetes mellitus without complications: Secondary | ICD-10-CM | POA: Diagnosis not present

## 2021-11-13 DIAGNOSIS — Z794 Long term (current) use of insulin: Secondary | ICD-10-CM | POA: Diagnosis not present

## 2021-11-17 DIAGNOSIS — E1065 Type 1 diabetes mellitus with hyperglycemia: Secondary | ICD-10-CM | POA: Diagnosis not present

## 2021-11-17 DIAGNOSIS — Z794 Long term (current) use of insulin: Secondary | ICD-10-CM | POA: Diagnosis not present

## 2021-11-17 DIAGNOSIS — E109 Type 1 diabetes mellitus without complications: Secondary | ICD-10-CM | POA: Diagnosis not present

## 2021-12-11 DIAGNOSIS — I10 Essential (primary) hypertension: Secondary | ICD-10-CM | POA: Diagnosis not present

## 2021-12-11 DIAGNOSIS — Z8639 Personal history of other endocrine, nutritional and metabolic disease: Secondary | ICD-10-CM | POA: Diagnosis not present

## 2021-12-11 DIAGNOSIS — Z0000 Encounter for general adult medical examination without abnormal findings: Secondary | ICD-10-CM | POA: Diagnosis not present

## 2021-12-11 DIAGNOSIS — J45909 Unspecified asthma, uncomplicated: Secondary | ICD-10-CM | POA: Diagnosis not present

## 2021-12-11 DIAGNOSIS — E785 Hyperlipidemia, unspecified: Secondary | ICD-10-CM | POA: Diagnosis not present

## 2021-12-11 DIAGNOSIS — Z23 Encounter for immunization: Secondary | ICD-10-CM | POA: Diagnosis not present

## 2022-01-21 DIAGNOSIS — N3281 Overactive bladder: Secondary | ICD-10-CM | POA: Diagnosis not present

## 2022-01-21 DIAGNOSIS — R413 Other amnesia: Secondary | ICD-10-CM | POA: Diagnosis not present

## 2022-01-21 DIAGNOSIS — I10 Essential (primary) hypertension: Secondary | ICD-10-CM | POA: Diagnosis not present

## 2022-01-27 DIAGNOSIS — E109 Type 1 diabetes mellitus without complications: Secondary | ICD-10-CM | POA: Diagnosis not present

## 2022-01-27 DIAGNOSIS — Z8639 Personal history of other endocrine, nutritional and metabolic disease: Secondary | ICD-10-CM | POA: Diagnosis not present

## 2022-01-27 DIAGNOSIS — Z794 Long term (current) use of insulin: Secondary | ICD-10-CM | POA: Diagnosis not present

## 2022-01-27 DIAGNOSIS — I10 Essential (primary) hypertension: Secondary | ICD-10-CM | POA: Diagnosis not present

## 2022-02-15 DIAGNOSIS — Z794 Long term (current) use of insulin: Secondary | ICD-10-CM | POA: Diagnosis not present

## 2022-02-15 DIAGNOSIS — E109 Type 1 diabetes mellitus without complications: Secondary | ICD-10-CM | POA: Diagnosis not present

## 2022-02-15 DIAGNOSIS — E1065 Type 1 diabetes mellitus with hyperglycemia: Secondary | ICD-10-CM | POA: Diagnosis not present

## 2022-03-17 DIAGNOSIS — Z794 Long term (current) use of insulin: Secondary | ICD-10-CM | POA: Diagnosis not present

## 2022-03-17 DIAGNOSIS — E1065 Type 1 diabetes mellitus with hyperglycemia: Secondary | ICD-10-CM | POA: Diagnosis not present

## 2022-03-17 DIAGNOSIS — E109 Type 1 diabetes mellitus without complications: Secondary | ICD-10-CM | POA: Diagnosis not present

## 2022-04-01 DIAGNOSIS — Z1231 Encounter for screening mammogram for malignant neoplasm of breast: Secondary | ICD-10-CM | POA: Diagnosis not present

## 2022-04-27 DIAGNOSIS — Z8639 Personal history of other endocrine, nutritional and metabolic disease: Secondary | ICD-10-CM | POA: Diagnosis not present

## 2022-04-27 DIAGNOSIS — Z794 Long term (current) use of insulin: Secondary | ICD-10-CM | POA: Diagnosis not present

## 2022-04-27 DIAGNOSIS — E109 Type 1 diabetes mellitus without complications: Secondary | ICD-10-CM | POA: Diagnosis not present

## 2022-04-27 DIAGNOSIS — I10 Essential (primary) hypertension: Secondary | ICD-10-CM | POA: Diagnosis not present

## 2022-05-28 DIAGNOSIS — E1065 Type 1 diabetes mellitus with hyperglycemia: Secondary | ICD-10-CM | POA: Diagnosis not present

## 2022-05-28 DIAGNOSIS — Z794 Long term (current) use of insulin: Secondary | ICD-10-CM | POA: Diagnosis not present

## 2022-05-28 DIAGNOSIS — E109 Type 1 diabetes mellitus without complications: Secondary | ICD-10-CM | POA: Diagnosis not present

## 2022-06-29 DIAGNOSIS — Z961 Presence of intraocular lens: Secondary | ICD-10-CM | POA: Diagnosis not present

## 2022-06-29 DIAGNOSIS — H5211 Myopia, right eye: Secondary | ICD-10-CM | POA: Diagnosis not present

## 2022-06-29 DIAGNOSIS — H524 Presbyopia: Secondary | ICD-10-CM | POA: Diagnosis not present

## 2022-06-29 DIAGNOSIS — E103293 Type 1 diabetes mellitus with mild nonproliferative diabetic retinopathy without macular edema, bilateral: Secondary | ICD-10-CM | POA: Diagnosis not present

## 2022-06-29 DIAGNOSIS — H52222 Regular astigmatism, left eye: Secondary | ICD-10-CM | POA: Diagnosis not present

## 2022-07-02 DIAGNOSIS — Z794 Long term (current) use of insulin: Secondary | ICD-10-CM | POA: Diagnosis not present

## 2022-07-02 DIAGNOSIS — I10 Essential (primary) hypertension: Secondary | ICD-10-CM | POA: Diagnosis not present

## 2022-07-02 DIAGNOSIS — E109 Type 1 diabetes mellitus without complications: Secondary | ICD-10-CM | POA: Diagnosis not present

## 2022-07-02 DIAGNOSIS — E785 Hyperlipidemia, unspecified: Secondary | ICD-10-CM | POA: Diagnosis not present

## 2022-07-05 ENCOUNTER — Telehealth: Payer: Self-pay | Admitting: *Deleted

## 2022-07-05 NOTE — Chronic Care Management (AMB) (Signed)
  Care Coordination  Note  07/05/2022 Name: Kerry Compton MRN: [id number] DOB: 1956/12/08  [name] is a 66 y.o. year old female who is a primary care patient of Laurann Montana, MD. I reached out to Jessica Priest by phone today to offer care coordination services.      Ms. Cataldi was given information about Care Coordination services today including:  The Care Coordination services include support from the care team which includes your Nurse Coordinator, Clinical Social Worker, or Pharmacist.  The Care Coordination team is here to help remove barriers to the health concerns and goals most important to you. Care Coordination services are voluntary and the patient may decline or stop services at any time by request to their care team member.   Patient agreed to services and verbal consent obtained.   Follow up plan: Telephone appointment with care coordination team member scheduled for:07/06/22  [facility] Coordination Care Guide  Direct Dial: [phone]

## 2022-07-06 ENCOUNTER — Telehealth: Payer: Self-pay

## 2022-07-06 NOTE — Patient Outreach (Signed)
  Care Management   Outreach Note  07/06/2022 Name: Kerry Compton MRN: [id number] DOB: March 13, 1956  An unsuccessful telephone outreach was attempted today. The patient was referred to the case management team for assistance with care management and care coordination.   Follow Up Plan:  The care management team will reach out to the patient again over the next 1 days.   Juanell Fairly RN, BSN, [facility] Care Coordinator  [facility]  Phone: [phone]

## 2022-07-07 ENCOUNTER — Ambulatory Visit: Payer: Self-pay

## 2022-07-08 NOTE — Patient Outreach (Addendum)
  Care Coordination   Initial Visit Note   07/07/22 Name: Kerry Compton MRN: [id number] DOB: February 15, 1956  Kerry Compton is a 66 y.o. year old female who sees Laurann Montana, MD for primary care. I spoke with  Jessica Priest by phone today  What matters to the patients health and wellness today?  I want a workout progrma I can do at home .   Goals Addressed               This Visit's Progress     " I would like an exercise programa that I can do in my home" (pt-stated)        Care Coordination Interventions: l will connect with my coworkers to see what programs we have available and then have either I or someone from the program give the patient a call to advise her accordingly.Marland Kitchen         SDOH assessments and interventions completed:   Yes SDOH Interventions Today    Flowsheet Row Most Recent Value  SDOH Interventions   Food Insecurity Interventions Intervention Not Indicated  Housing Interventions Intervention Not Indicated  Transportation Interventions Intervention Not Indicated       Care Coordination Interventions Activated:  Yes Care Coordination Interventions:  Yes, provided  Follow up plan:  Follow up call will be made given the information found  Encounter Outcome:  Pt. Visit Completed  Juanell Fairly RN, BSN, [facility] Care Coordinator [facility]   Phone: [phone]

## 2022-07-20 ENCOUNTER — Ambulatory Visit: Payer: Self-pay

## 2022-07-20 NOTE — Patient Instructions (Signed)
Visit Information  Thank you for taking time to visit with me today. Please don't hesitate to contact me if I can be of assistance to you.   Following are the goals we discussed today:   Goals Addressed               This Visit's Progress     " I would like an exercise program that I can do in my home" (pt-stated)        Care Coordination Interventions: l will connect with my coworkers to see what programs we have available and then have either I or someone from the program give the patient a call to advise her accordingly.. 07/20/22:  Followed with the patient to see if she received the Exercise Program Activity Booklet.  She has not I advised to call me if she does not receive it within the next week or so. I will resend the information.           Please call the care guide team at [phone] if you need to cancel or reschedule your appointment.   Patient verbalizes understanding of instructions and care plan provided today and agrees to view in MyChart. Active MyChart status and patient understanding of how to access instructions and care plan via MyChart confirmed with patient.

## 2022-07-20 NOTE — Patient Outreach (Signed)
  Care Coordination   Follow Up Visit Note   07/20/2022 Name: Kerry Compton MRN: [id number] DOB: 1956/06/19  Kerry Compton is a 66 y.o. year old female who sees Laurann Montana, MD for primary care. I spoke with  Jessica Priest by phone today  What matters to the patients health and wellness today?  Waiting on exercise program sent in the     Goals Addressed               This Visit's Progress     " I would like an exercise program that I can do in my home" (pt-stated)        Care Coordination Interventions: l will connect with my coworkers to see what programs we have available and then have either I or someone from the program give the patient a call to advise her accordingly.. 07/20/22:  Followed with the patient to see if she received the Exercise Program Activity Booklet.  She has not I advised to call me if she does not receive it within the next week or so. I will resend the information.         SDOH assessments and interventions completed:  No     Care Coordination Interventions Activated:  Yes  Care Coordination Interventions:  Yes, provided   Follow up plan:  Waiting for the patient to give me a call if she does not receive the information.     Encounter Outcome:  Pt. Visit Completed   Juanell Fairly RN, BSN, [facility] Care Coordinator [facility]   Phone: [phone]

## 2022-07-29 DIAGNOSIS — Z8639 Personal history of other endocrine, nutritional and metabolic disease: Secondary | ICD-10-CM | POA: Diagnosis not present

## 2022-07-29 DIAGNOSIS — Z794 Long term (current) use of insulin: Secondary | ICD-10-CM | POA: Diagnosis not present

## 2022-07-29 DIAGNOSIS — E109 Type 1 diabetes mellitus without complications: Secondary | ICD-10-CM | POA: Diagnosis not present

## 2022-07-29 DIAGNOSIS — I10 Essential (primary) hypertension: Secondary | ICD-10-CM | POA: Diagnosis not present

## 2022-08-11 DIAGNOSIS — E109 Type 1 diabetes mellitus without complications: Secondary | ICD-10-CM | POA: Diagnosis not present

## 2022-08-11 DIAGNOSIS — Z794 Long term (current) use of insulin: Secondary | ICD-10-CM | POA: Diagnosis not present

## 2022-08-11 DIAGNOSIS — E1065 Type 1 diabetes mellitus with hyperglycemia: Secondary | ICD-10-CM | POA: Diagnosis not present

## 2022-10-21 DIAGNOSIS — R0981 Nasal congestion: Secondary | ICD-10-CM | POA: Diagnosis not present

## 2022-10-21 DIAGNOSIS — R0982 Postnasal drip: Secondary | ICD-10-CM | POA: Diagnosis not present

## 2022-10-21 DIAGNOSIS — R5383 Other fatigue: Secondary | ICD-10-CM | POA: Diagnosis not present

## 2022-10-21 DIAGNOSIS — J019 Acute sinusitis, unspecified: Secondary | ICD-10-CM | POA: Diagnosis not present

## 2022-12-02 DIAGNOSIS — E1065 Type 1 diabetes mellitus with hyperglycemia: Secondary | ICD-10-CM | POA: Diagnosis not present

## 2022-12-02 DIAGNOSIS — Z794 Long term (current) use of insulin: Secondary | ICD-10-CM | POA: Diagnosis not present

## 2022-12-02 DIAGNOSIS — E109 Type 1 diabetes mellitus without complications: Secondary | ICD-10-CM | POA: Diagnosis not present

## 2022-12-13 DIAGNOSIS — E860 Dehydration: Secondary | ICD-10-CM | POA: Diagnosis not present

## 2022-12-13 DIAGNOSIS — R197 Diarrhea, unspecified: Secondary | ICD-10-CM | POA: Diagnosis not present

## 2022-12-13 DIAGNOSIS — R112 Nausea with vomiting, unspecified: Secondary | ICD-10-CM | POA: Diagnosis not present

## 2022-12-13 DIAGNOSIS — E118 Type 2 diabetes mellitus with unspecified complications: Secondary | ICD-10-CM | POA: Diagnosis not present

## 2022-12-30 DIAGNOSIS — E109 Type 1 diabetes mellitus without complications: Secondary | ICD-10-CM | POA: Diagnosis not present

## 2022-12-30 DIAGNOSIS — Z794 Long term (current) use of insulin: Secondary | ICD-10-CM | POA: Diagnosis not present

## 2022-12-30 DIAGNOSIS — I10 Essential (primary) hypertension: Secondary | ICD-10-CM | POA: Diagnosis not present

## 2022-12-30 DIAGNOSIS — Z8639 Personal history of other endocrine, nutritional and metabolic disease: Secondary | ICD-10-CM | POA: Diagnosis not present

## 2023-01-05 DIAGNOSIS — Z794 Long term (current) use of insulin: Secondary | ICD-10-CM | POA: Diagnosis not present

## 2023-01-05 DIAGNOSIS — Z23 Encounter for immunization: Secondary | ICD-10-CM | POA: Diagnosis not present

## 2023-01-05 DIAGNOSIS — E785 Hyperlipidemia, unspecified: Secondary | ICD-10-CM | POA: Diagnosis not present

## 2023-01-05 DIAGNOSIS — I10 Essential (primary) hypertension: Secondary | ICD-10-CM | POA: Diagnosis not present

## 2023-01-05 DIAGNOSIS — Z0000 Encounter for general adult medical examination without abnormal findings: Secondary | ICD-10-CM | POA: Diagnosis not present

## 2023-01-05 DIAGNOSIS — E109 Type 1 diabetes mellitus without complications: Secondary | ICD-10-CM | POA: Diagnosis not present

## 2023-01-24 DIAGNOSIS — M8589 Other specified disorders of bone density and structure, multiple sites: Secondary | ICD-10-CM | POA: Diagnosis not present

## 2023-01-24 DIAGNOSIS — Z13820 Encounter for screening for osteoporosis: Secondary | ICD-10-CM | POA: Diagnosis not present

## 2023-01-24 DIAGNOSIS — M85851 Other specified disorders of bone density and structure, right thigh: Secondary | ICD-10-CM | POA: Diagnosis not present

## 2023-02-18 DIAGNOSIS — M25552 Pain in left hip: Secondary | ICD-10-CM | POA: Diagnosis not present

## 2023-02-18 DIAGNOSIS — G8929 Other chronic pain: Secondary | ICD-10-CM | POA: Diagnosis not present

## 2023-03-01 DIAGNOSIS — D485 Neoplasm of uncertain behavior of skin: Secondary | ICD-10-CM | POA: Diagnosis not present

## 2023-03-01 DIAGNOSIS — L814 Other melanin hyperpigmentation: Secondary | ICD-10-CM | POA: Diagnosis not present

## 2023-03-31 DIAGNOSIS — E109 Type 1 diabetes mellitus without complications: Secondary | ICD-10-CM | POA: Diagnosis not present

## 2023-03-31 DIAGNOSIS — I10 Essential (primary) hypertension: Secondary | ICD-10-CM | POA: Diagnosis not present

## 2023-03-31 DIAGNOSIS — E1065 Type 1 diabetes mellitus with hyperglycemia: Secondary | ICD-10-CM | POA: Diagnosis not present

## 2023-03-31 DIAGNOSIS — Z8639 Personal history of other endocrine, nutritional and metabolic disease: Secondary | ICD-10-CM | POA: Diagnosis not present

## 2023-03-31 DIAGNOSIS — Z794 Long term (current) use of insulin: Secondary | ICD-10-CM | POA: Diagnosis not present

## 2023-04-06 DIAGNOSIS — Z1231 Encounter for screening mammogram for malignant neoplasm of breast: Secondary | ICD-10-CM | POA: Diagnosis not present

## 2023-04-06 DIAGNOSIS — R92323 Mammographic fibroglandular density, bilateral breasts: Secondary | ICD-10-CM | POA: Diagnosis not present

## 2023-04-11 DIAGNOSIS — R42 Dizziness and giddiness: Secondary | ICD-10-CM | POA: Diagnosis not present

## 2023-04-11 DIAGNOSIS — Z8679 Personal history of other diseases of the circulatory system: Secondary | ICD-10-CM | POA: Diagnosis not present

## 2023-05-23 DIAGNOSIS — E103293 Type 1 diabetes mellitus with mild nonproliferative diabetic retinopathy without macular edema, bilateral: Secondary | ICD-10-CM | POA: Diagnosis not present

## 2023-08-31 DIAGNOSIS — I10 Essential (primary) hypertension: Secondary | ICD-10-CM | POA: Diagnosis not present

## 2023-08-31 DIAGNOSIS — Z794 Long term (current) use of insulin: Secondary | ICD-10-CM | POA: Diagnosis not present

## 2023-08-31 DIAGNOSIS — Z8639 Personal history of other endocrine, nutritional and metabolic disease: Secondary | ICD-10-CM | POA: Diagnosis not present

## 2023-08-31 DIAGNOSIS — E109 Type 1 diabetes mellitus without complications: Secondary | ICD-10-CM | POA: Diagnosis not present

## 2023-09-06 ENCOUNTER — Other Ambulatory Visit: Payer: Self-pay

## 2023-09-06 ENCOUNTER — Inpatient Hospital Stay (HOSPITAL_COMMUNITY)
Admission: EM | Admit: 2023-09-06 | Discharge: 2023-09-14 | DRG: 233 | Disposition: A | Discharge status: Home / Self Care | Payer: Medicare Other | Attending: Surgery | Admitting: Surgery

## 2023-09-06 ENCOUNTER — Emergency Department (HOSPITAL_COMMUNITY): Payer: Medicare Other

## 2023-09-06 ENCOUNTER — Encounter (HOSPITAL_COMMUNITY): Payer: Self-pay

## 2023-09-06 DIAGNOSIS — Z9641 Presence of insulin pump (external) (internal): Secondary | ICD-10-CM | POA: Diagnosis present

## 2023-09-06 DIAGNOSIS — D62 Acute posthemorrhagic anemia: Secondary | ICD-10-CM | POA: Diagnosis not present

## 2023-09-06 DIAGNOSIS — Z951 Presence of aortocoronary bypass graft: Secondary | ICD-10-CM

## 2023-09-06 DIAGNOSIS — I214 Non-ST elevation (NSTEMI) myocardial infarction: Principal | ICD-10-CM | POA: Diagnosis present

## 2023-09-06 DIAGNOSIS — Z6836 Body mass index (BMI) 36.0-36.9, adult: Secondary | ICD-10-CM | POA: Diagnosis not present

## 2023-09-06 DIAGNOSIS — J329 Chronic sinusitis, unspecified: Secondary | ICD-10-CM | POA: Diagnosis present

## 2023-09-06 DIAGNOSIS — Z79899 Other long term (current) drug therapy: Secondary | ICD-10-CM

## 2023-09-06 DIAGNOSIS — Z881 Allergy status to other antibiotic agents status: Secondary | ICD-10-CM | POA: Diagnosis not present

## 2023-09-06 DIAGNOSIS — R0989 Other specified symptoms and signs involving the circulatory and respiratory systems: Secondary | ICD-10-CM | POA: Diagnosis not present

## 2023-09-06 DIAGNOSIS — E785 Hyperlipidemia, unspecified: Secondary | ICD-10-CM | POA: Diagnosis not present

## 2023-09-06 DIAGNOSIS — J9811 Atelectasis: Secondary | ICD-10-CM | POA: Diagnosis not present

## 2023-09-06 DIAGNOSIS — I10 Essential (primary) hypertension: Secondary | ICD-10-CM | POA: Diagnosis not present

## 2023-09-06 DIAGNOSIS — Z8249 Family history of ischemic heart disease and other diseases of the circulatory system: Secondary | ICD-10-CM | POA: Diagnosis not present

## 2023-09-06 DIAGNOSIS — E16A1 Hypoglycemia level 1: Secondary | ICD-10-CM | POA: Diagnosis not present

## 2023-09-06 DIAGNOSIS — Z1152 Encounter for screening for COVID-19: Secondary | ICD-10-CM | POA: Diagnosis not present

## 2023-09-06 DIAGNOSIS — J90 Pleural effusion, not elsewhere classified: Secondary | ICD-10-CM | POA: Diagnosis not present

## 2023-09-06 DIAGNOSIS — E119 Type 2 diabetes mellitus without complications: Secondary | ICD-10-CM | POA: Diagnosis not present

## 2023-09-06 DIAGNOSIS — I110 Hypertensive heart disease with heart failure: Secondary | ICD-10-CM | POA: Diagnosis present

## 2023-09-06 DIAGNOSIS — Z794 Long term (current) use of insulin: Secondary | ICD-10-CM | POA: Diagnosis not present

## 2023-09-06 DIAGNOSIS — Z7951 Long term (current) use of inhaled steroids: Secondary | ICD-10-CM | POA: Diagnosis not present

## 2023-09-06 DIAGNOSIS — J45909 Unspecified asthma, uncomplicated: Secondary | ICD-10-CM | POA: Diagnosis present

## 2023-09-06 DIAGNOSIS — I2510 Atherosclerotic heart disease of native coronary artery without angina pectoris: Secondary | ICD-10-CM | POA: Diagnosis present

## 2023-09-06 DIAGNOSIS — Z833 Family history of diabetes mellitus: Secondary | ICD-10-CM | POA: Diagnosis not present

## 2023-09-06 DIAGNOSIS — E10649 Type 1 diabetes mellitus with hypoglycemia without coma: Secondary | ICD-10-CM | POA: Diagnosis not present

## 2023-09-06 DIAGNOSIS — J4530 Mild persistent asthma, uncomplicated: Secondary | ICD-10-CM

## 2023-09-06 DIAGNOSIS — Z48812 Encounter for surgical aftercare following surgery on the circulatory system: Secondary | ICD-10-CM | POA: Diagnosis not present

## 2023-09-06 DIAGNOSIS — R0789 Other chest pain: Secondary | ICD-10-CM | POA: Diagnosis not present

## 2023-09-06 DIAGNOSIS — I5032 Chronic diastolic (congestive) heart failure: Secondary | ICD-10-CM | POA: Diagnosis not present

## 2023-09-06 DIAGNOSIS — D119 Benign neoplasm of major salivary gland, unspecified: Secondary | ICD-10-CM | POA: Diagnosis not present

## 2023-09-06 DIAGNOSIS — Z01810 Encounter for preprocedural cardiovascular examination: Secondary | ICD-10-CM | POA: Diagnosis not present

## 2023-09-06 DIAGNOSIS — I5031 Acute diastolic (congestive) heart failure: Secondary | ICD-10-CM | POA: Diagnosis not present

## 2023-09-06 DIAGNOSIS — E669 Obesity, unspecified: Secondary | ICD-10-CM | POA: Diagnosis present

## 2023-09-06 DIAGNOSIS — E7800 Pure hypercholesterolemia, unspecified: Secondary | ICD-10-CM | POA: Diagnosis not present

## 2023-09-06 DIAGNOSIS — E109 Type 1 diabetes mellitus without complications: Secondary | ICD-10-CM | POA: Diagnosis present

## 2023-09-06 DIAGNOSIS — E1065 Type 1 diabetes mellitus with hyperglycemia: Secondary | ICD-10-CM | POA: Diagnosis present

## 2023-09-06 DIAGNOSIS — R079 Chest pain, unspecified: Secondary | ICD-10-CM | POA: Diagnosis not present

## 2023-09-06 DIAGNOSIS — I2583 Coronary atherosclerosis due to lipid rich plaque: Secondary | ICD-10-CM | POA: Diagnosis not present

## 2023-09-06 DIAGNOSIS — I340 Nonrheumatic mitral (valve) insufficiency: Secondary | ICD-10-CM | POA: Diagnosis not present

## 2023-09-06 LAB — CBC
HCT: 44.7 % (ref 36.0–46.0)
Hemoglobin: 14.8 g/dL (ref 12.0–15.0)
MCH: 29.5 pg (ref 26.0–34.0)
MCHC: 33.1 g/dL (ref 30.0–36.0)
MCV: 89.2 fL (ref 80.0–100.0)
Platelets: 251 10*3/uL (ref 150–400)
RBC: 5.01 MIL/uL (ref 3.87–5.11)
RDW: 12.6 % (ref 11.5–15.5)
WBC: 7.1 10*3/uL (ref 4.0–10.5)
nRBC: 0 % (ref 0.0–0.2)

## 2023-09-06 LAB — GLUCOSE, CAPILLARY: Glucose-Capillary: 294 mg/dL — ABNORMAL HIGH (ref 70–99)

## 2023-09-06 LAB — BASIC METABOLIC PANEL
Anion gap: 10 (ref 5–15)
BUN: 11 mg/dL (ref 8–23)
CO2: 25 mmol/L (ref 22–32)
Calcium: 9.3 mg/dL (ref 8.9–10.3)
Chloride: 106 mmol/L (ref 98–111)
Creatinine, Ser: 0.66 mg/dL (ref 0.44–1.00)
GFR, Estimated: >60 mL/min (ref >60)
Glucose, Bld: 96 mg/dL (ref 70–99)
Potassium: 4.5 mmol/L (ref 3.5–5.1)
Sodium: 141 mmol/L (ref 135–145)

## 2023-09-06 LAB — SARS CORONAVIRUS 2 BY RT PCR: SARS Coronavirus 2 by RT PCR: NEGATIVE

## 2023-09-06 LAB — CBG MONITORING, ED: Glucose-Capillary: 260 mg/dL — ABNORMAL HIGH (ref 70–99)

## 2023-09-06 LAB — TROPONIN I (HIGH SENSITIVITY)
Troponin I (High Sensitivity): 103 ng/L (ref <18)
Troponin I (High Sensitivity): 111 ng/L (ref <18)

## 2023-09-06 MED ORDER — ACETAMINOPHEN 325 MG PO TABS
650.0000 mg | ORAL_TABLET | ORAL | Status: DC | PRN
  Administered 2023-09-06: 650 mg via ORAL
  Filled 2023-09-06: qty 2

## 2023-09-06 MED ORDER — INSULIN ASPART 100 UNIT/ML IJ SOLN
0.0000 [IU] | INTRAMUSCULAR | Status: DC
  Administered 2023-09-06 – 2023-09-07 (×3): 3 [IU] via SUBCUTANEOUS
  Administered 2023-09-07: 2 [IU] via SUBCUTANEOUS

## 2023-09-06 MED ORDER — SODIUM CHLORIDE 0.9 % WEIGHT BASED INFUSION
3.0000 mL/kg/h | INTRAVENOUS | Status: DC
  Administered 2023-09-07: 3 mL/kg/h via INTRAVENOUS

## 2023-09-06 MED ORDER — NITROGLYCERIN 0.4 MG SL SUBL: 0.4000 mg | SUBLINGUAL_TABLET | SUBLINGUAL | Status: DC | PRN

## 2023-09-06 MED ORDER — ASPIRIN 325 MG PO TABS
325.0000 mg | ORAL_TABLET | Freq: Once | ORAL | Status: AC
  Administered 2023-09-06: 325 mg via ORAL
  Filled 2023-09-06: qty 1

## 2023-09-06 MED ORDER — METOPROLOL TARTRATE 25 MG PO TABS
25.0000 mg | ORAL_TABLET | Freq: Two times a day (BID) | ORAL | Status: DC
  Administered 2023-09-06 – 2023-09-07 (×2): 25 mg via ORAL
  Filled 2023-09-06 (×2): qty 1

## 2023-09-06 MED ORDER — ATORVASTATIN CALCIUM 80 MG PO TABS
80.0000 mg | ORAL_TABLET | Freq: Every day | ORAL | Status: DC
  Administered 2023-09-07 – 2023-09-14 (×7): 80 mg via ORAL
  Filled 2023-09-06: qty 1
  Filled 2023-09-06: qty 2
  Filled 2023-09-06 (×6): qty 1

## 2023-09-06 MED ORDER — ASPIRIN 81 MG PO TBEC
81.0000 mg | DELAYED_RELEASE_TABLET | Freq: Every day | ORAL | Status: DC
  Administered 2023-09-07 – 2023-09-08 (×2): 81 mg via ORAL
  Filled 2023-09-06 (×3): qty 1

## 2023-09-06 MED ORDER — AMLODIPINE BESYLATE 2.5 MG PO TABS
2.5000 mg | ORAL_TABLET | Freq: Every day | ORAL | Status: DC
  Administered 2023-09-07 – 2023-09-08 (×2): 2.5 mg via ORAL
  Filled 2023-09-06 (×2): qty 1

## 2023-09-06 MED ORDER — INSULIN PUMP
SUBCUTANEOUS | Status: DC
  Filled 2023-09-06: qty 1

## 2023-09-06 MED ORDER — HEPARIN (PORCINE) 25000 UT/250ML-% IV SOLN
1100.0000 [IU]/h | INTRAVENOUS | Status: DC
  Administered 2023-09-06: 1100 [IU]/h via INTRAVENOUS
  Filled 2023-09-06: qty 250

## 2023-09-06 MED ORDER — ONDANSETRON HCL 4 MG/2ML IJ SOLN: 4.0000 mg | Freq: Four times a day (QID) | INTRAMUSCULAR | Status: DC | PRN

## 2023-09-06 MED ORDER — SODIUM CHLORIDE 0.9 % WEIGHT BASED INFUSION
1.0000 mL/kg/h | INTRAVENOUS | Status: DC
  Administered 2023-09-07: 1 mL/kg/h via INTRAVENOUS

## 2023-09-06 MED ORDER — IRBESARTAN 150 MG PO TABS
300.0000 mg | ORAL_TABLET | Freq: Every day | ORAL | Status: DC
  Administered 2023-09-07 – 2023-09-08 (×2): 300 mg via ORAL
  Filled 2023-09-06 (×2): qty 2
  Filled 2023-09-06: qty 1

## 2023-09-06 MED ORDER — HEPARIN BOLUS VIA INFUSION
4000.0000 [IU] | Freq: Once | INTRAVENOUS | Status: AC
  Administered 2023-09-06: 4000 [IU] via INTRAVENOUS
  Filled 2023-09-06: qty 4000

## 2023-09-06 MED ORDER — INSULIN GLARGINE-YFGN 100 UNIT/ML ~~LOC~~ SOLN
10.0000 [IU] | Freq: Every day | SUBCUTANEOUS | Status: DC
  Administered 2023-09-06: 10 [IU] via SUBCUTANEOUS
  Filled 2023-09-06 (×2): qty 0.1

## 2023-09-06 NOTE — Progress Notes (Signed)
ANTICOAGULATION CONSULT NOTE - Initial Consult  Pharmacy Consult for heparin Indication: chest pain/ACS  Allergies  Allergen Reactions   Azithromycin Diarrhea and Nausea And Vomiting   Indocin [Indomethacin] Other (See Comments)    Light headed and loopy     Patient Measurements: Height: 5\' 5"  (165.1 cm) Weight: 97.5 kg (214 lb 15.2 oz) IBW/kg (Calculated) : 57 Heparin Dosing Weight: 79kg  Vital Signs: Temp: 98.2 F (36.8 C) (09/24 1636) BP: 157/69 (09/24 1636) Pulse Rate: 80 (09/24 1636)  Labs: Recent Labs    09/06/23 1526  HGB 14.8  HCT 44.7  PLT 251  CREATININE 0.66  TROPONINIHS 103*    Estimated Creatinine Clearance: 79.9 mL/min (by C-G formula based on SCr of 0.66 mg/dL).   Medical History: Past Medical History:  Diagnosis Date   Allergy    Asthma    Diabetes mellitus    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, no current liver problems   Hyperlipemia    Hypertension     Assessment: Kerry Compton presenting with CP, elevated troponin, she is not on anticoagulation PTA, CBC wnl  Goal of Therapy:  Heparin level 0.3-0.7 units/ml Monitor platelets by anticoagulation protocol: Yes   Plan:  Heparin 4000 units IV x 1, and gtt at 1100 units/hr F/u 6 hour heparin level F/u cards eval and recs  Kerry Compton, PharmD, [facility] Clinical Pharmacist ED Pharmacist Phone # [phone] 09/06/2023 6:02 PM

## 2023-09-06 NOTE — H&P (Signed)
History and Physical    CYERRA LEHNE WUJ:[id number] DOB: 20-Dec-1955 DOA: 09/06/2023  PCP: Laurann Montana, MD   Patient coming from: Home   Chief Complaint: Chest pain   HPI: NALISHA FULFER is a pleasant 67 y.o. female with medical history significant for insulin-dependent diabetes mellitus, hypertension, and hyperlipidemia who presents for evaluation of chest pain.  Patient reports that she was doing well until 09/03/2023 when she developed acute onset of substernal chest discomfort and shortness of breath while walking up an incline.  Pain resolved with rest but recurred yesterday while ascending stairs.  She has continued to experience exertional chest discomfort that resolves with rest.  She denies any leg swelling, fevers, chills, or cough.  ED Course: Upon arrival to the ED, patient is found to be afebrile and saturating well on room air with normal heart rate and elevated blood pressure.  EKG demonstrates sinus rhythm and chest x-ray is negative for acute cardiopulmonary disease.  Labs are most notable for troponin of 103, normal WBC and hemoglobin, normal BUN and creatinine, normal electrolytes, and negative COVID-19 PCR.  Cardiology was consulted by the ED physician, IV heparin was started, and the patient was treated with 325 mg of aspirin in the ED.  Review of Systems:  All other systems reviewed and apart from HPI, are negative.  Past Medical History:  Diagnosis Date   Allergy    Asthma    Diabetes mellitus    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, no current liver problems   Hyperlipemia    Hypertension     Past Surgical History:  Procedure Laterality Date   COLONOSCOPY WITH PROPOFOL N/A 06/18/2014   Procedure: COLONOSCOPY WITH PROPOFOL;  Surgeon: Charolett Bumpers, MD;  Location: [facility];  Service: Endoscopy;  Laterality: N/A;   ROTATOR CUFF REPAIR Right 10 yrs ago   SEPTOPLASTY     TUBAL LIGATION      Social History:   reports that she has never  smoked. She has never used smokeless tobacco. She reports that she does not drink alcohol and does not use drugs.  Allergies  Allergen Reactions   Azithromycin Diarrhea and Nausea And Vomiting   Indocin [Indomethacin] Other (See Comments)    Light headed and loopy     Family History  Problem Relation Age of Onset   Diabetes Mother    Asthma Mother    Arthritis Mother    Diabetes Brother    Diabetes Maternal Grandmother    Diabetes Brother    Hypertension Brother    Hyperlipidemia Brother      Prior to Admission medications   Medication Sig Start Date End Date Taking? Authorizing Provider  albuterol (PROVENTIL HFA;VENTOLIN HFA) 108 (90 BASE) MCG/ACT inhaler Inhale into the lungs every 6 (six) hours as needed for wheezing or shortness of breath.    [provider]  amLODipine (NORVASC) 2.5 MG tablet Take 2.5 mg by mouth daily. 07/02/22   [provider]  amoxicillin-clavulanate (AUGMENTIN) 875-125 MG tablet Take 1 tablet by mouth every 12 (twelve) hours. 06/15/20   Robinson, [place] N, PA-C  atorvastatin (LIPITOR) 80 MG tablet Take 80 mg by mouth daily.      [provider]  Cholecalciferol (VITAMIN D3) 50 MCG (2000 UT) capsule 1 tablet    [provider]  Cyanocobalamin (VITAMIN B12) 1000 MCG TBCR 1 tablet 01/13/21   [provider]  fexofenadine (ALLEGRA) 180 MG tablet Take 180 mg by mouth daily.  [provider]  fluticasone (FLOVENT HFA) 44 MCG/ACT inhaler Inhale 1 puff into the lungs 2 (two) times daily.    [provider]  insulin glargine (LANTUS) 100 UNIT/ML injection Inject 15 Units into the skin daily as needed (if bs is too high).     [provider]  Insulin Human (INSULIN PUMP) 100 unit/ml SOLN Inject into the skin continuous. Humalog insulin pump basal rate 1200 am to 300 am = 1.0 600 am to 600 pm = 0.90    [provider]  meclizine (ANTIVERT) 25 MG tablet Take 12.5-25 mg by mouth every 8  (eight) hours as needed. Patient not taking: Reported on 07/07/2022 07/02/22   [provider]  Multiple Vitamin (MULTIVITAMIN WITH MINERALS) TABS tablet Take 1 tablet by mouth daily.    [provider]  omeprazole (PRILOSEC) 20 MG capsule 1 capsule 30 minutes before morning meal    [provider]  oxybutynin (DITROPAN-XL) 10 MG 24 hr tablet Take 10 mg by mouth daily. 07/02/22   [provider]  valsartan (DIOVAN) 320 MG tablet Take 320 mg by mouth daily.    [provider]    Physical Exam: Vitals:   09/06/23 1502 09/06/23 1504 09/06/23 1636 09/06/23 1900  BP: (!) 182/83  (!) 157/69 (!) 156/71  Pulse: 88  80 81  Resp: 18  19 15   Temp: 98.2 F (36.8 C)  98.2 F (36.8 C)   SpO2: 99%  100% 94%  Weight:  97.5 kg    Height:  5\' 5"  (1.651 m)      Constitutional: NAD, calm  Eyes: PERTLA, lids and conjunctivae normal ENMT: Mucous membranes are moist. Posterior pharynx clear of any exudate or lesions.   Neck: supple, no masses  Respiratory:  no wheezing, no crackles. No accessory muscle use.  Cardiovascular: S1 & S2 heard, regular rate and rhythm. No extremity edema.  Abdomen: No distension, no tenderness, soft. Bowel sounds active.  Musculoskeletal: no clubbing / cyanosis. No joint deformity upper and lower extremities.   Skin: no significant rashes, lesions, ulcers. Warm, dry, well-perfused. Neurologic: CN 2-12 grossly intact. Moving all extremities. Alert and oriented.  Psychiatric: Pleasant. Cooperative.    Labs and Imaging on Admission: I have personally reviewed following labs and imaging studies  CBC: Recent Labs  Lab 09/06/23 1526  WBC 7.1  HGB 14.8  HCT 44.7  MCV 89.2  PLT 251   Basic Metabolic Panel: Recent Labs  Lab 09/06/23 1526  NA 141  K 4.5  CL 106  CO2 25  GLUCOSE 96  BUN 11  CREATININE 0.66  CALCIUM 9.3   GFR: Estimated Creatinine Clearance: 79.9 mL/min (by C-G formula based on SCr of 0.66 mg/dL). Liver  Function Tests: No results for input(s): "AST", "ALT", "ALKPHOS", "BILITOT", "PROT", "ALBUMIN" in the last 168 hours. No results for input(s): "LIPASE", "AMYLASE" in the last 168 hours. No results for input(s): "AMMONIA" in the last 168 hours. Coagulation Profile: No results for input(s): "INR", "PROTIME" in the last 168 hours. Cardiac Enzymes: No results for input(s): "CKTOTAL", "CKMB", "CKMBINDEX", "TROPONINI" in the last 168 hours. BNP (last 3 results) No results for input(s): "PROBNP" in the last 8760 hours. HbA1C: No results for input(s): "HGBA1C" in the last 72 hours. CBG: No results for input(s): "GLUCAP" in the last 168 hours. Lipid Profile: No results for input(s): "CHOL", "HDL", "LDLCALC", "TRIG", "CHOLHDL", "LDLDIRECT" in the last 72 hours. Thyroid Function Tests: No results for input(s): "TSH", "T4TOTAL", "FREET4", "T3FREE", "THYROIDAB"  in the last 72 hours. Anemia Panel: No results for input(s): "VITAMINB12", "FOLATE", "FERRITIN", "TIBC", "IRON", "RETICCTPCT" in the last 72 hours. Urine analysis:    Component Value Date/Time   COLORURINE YELLOW 02/07/2018 1915   APPEARANCEUR CLEAR 02/07/2018 1915   LABSPEC 1.005 02/07/2018 1915   PHURINE 6.0 02/07/2018 1915   GLUCOSEU NEGATIVE 02/07/2018 1915   HGBUR NEGATIVE 02/07/2018 1915   BILIRUBINUR NEGATIVE 02/07/2018 1915   BILIRUBINUR neg 03/19/2013 1347   KETONESUR NEGATIVE 02/07/2018 1915   PROTEINUR NEGATIVE 02/07/2018 1915   UROBILINOGEN 0.2 08/27/2015 1925   NITRITE NEGATIVE 02/07/2018 1915   LEUKOCYTESUR TRACE (A) 02/07/2018 1915   Sepsis Labs: @LABRCNTIP (procalcitonin:4,lacticidven:4) ) Recent Results (from the past 240 hour(s))  SARS Coronavirus 2 by RT PCR (hospital order, performed in [facility] hospital lab) *cepheid single result test* Anterior Nasal Swab     Status: None   Collection Time: 09/06/23  5:10 PM   Specimen: Anterior Nasal Swab  Result Value Ref Range Status   SARS Coronavirus 2 by RT PCR  NEGATIVE NEGATIVE Final    Comment: Performed at [facility], [address], [place], [place] [address]     Radiological Exams on Admission: DG Chest 2 View  Result Date: 09/06/2023 CLINICAL DATA:  Chest pain. EXAM: CHEST - 2 VIEW COMPARISON:  February 08, 2018. FINDINGS: The heart size and mediastinal contours are within normal limits. Both lungs are clear. The visualized skeletal structures are unremarkable. IMPRESSION: No active cardiopulmonary disease. Electronically Signed   By: Lupita Raider M.D.   On: 09/06/2023 17:02    EKG: Independently reviewed. Sinus rhythm.   Assessment/Plan   1. NSTEMI - No chest pain in ED   - ASA 325 mg given in ED and IV heparin started, Lopressor started   - Continue IV heparin, ASA, Lipitor, and metoprolol, check echo, lipids, and A1c    2. Insulin-dependent DM  - A1c was 7.6% in 2016; serum glucose 96 in ED  - She uses insulin pump but does not have supplies with her  - Check CBG and use 10 units long-acting with sliding-scale correctional for now and adjust as needed     3. Hypertension  - Continue Norvasc, metoprolol, ARB   4. Hyperlipidemia  - Continue Lipitor    5. Asthma  - Not in exacerbation  - Continue ICS and albuterol    DVT prophylaxis: IV heparin  Code Status: Full  Level of Care: Level of care: Telemetry Cardiac Family Communication: None present  Disposition Plan:  Patient is from: home  Anticipated d/c is to: Home  Anticipated d/c date is: 09/09/23  Patient currently: Pending echo, likely LHC  Consults called: Cardiology  Admission status: Inpatient     Briscoe Deutscher, MD [facility]  09/06/2023, 7:20 PM

## 2023-09-06 NOTE — ED Notes (Signed)
Cardiology at bedside.

## 2023-09-06 NOTE — ED Notes (Signed)
ED TO INPATIENT HANDOFF REPORT  ED Nurse Name and Phone #: [phone]  S Name/Age/Gender Kerry Compton 67 y.o. female Room/Bed: 019C/019C  Code Status   Code Status: Full Code  Home/SNF/Other Home Patient oriented to: self, place, time, and situation Is this baseline? Yes   Triage Complete: Triage complete  Chief Complaint NSTEMI (non-ST elevated myocardial infarction) [facility]) [I21.4]  Triage Note Pt sent by walk in clinic for abnormal EKG. Pt c/o non radiating midsternal chest pain, SOBx3d. Pt denies N/V.    Allergies Allergies  Allergen Reactions   Azithromycin Diarrhea and Nausea And Vomiting   Indocin [Indomethacin] Other (See Comments)    Light headed and loopy     Level of Care/Admitting Diagnosis ED Disposition     ED Disposition  Admit   Condition  --   Comment  Hospital Area: [facility] [[id number]]  Level of Care: Telemetry Cardiac [[id number]]  May admit patient to [facility] or [facility] if equivalent level of care is available:: No  Covid Evaluation: Confirmed COVID Negative  Diagnosis: NSTEMI (non-ST elevated myocardial infarction) [facility]) [[id number]]  Admitting Physician: Briscoe Deutscher [[id number]]  Attending Physician: Briscoe Deutscher [[id number]]  Certification:: I certify this patient will need inpatient services for at least 2 midnights  Expected Medical Readiness: 09/09/2023          B Medical/Surgery History Past Medical History:  Diagnosis Date   Allergy    Asthma    Diabetes mellitus    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, no current liver problems   Hyperlipemia    Hypertension    Past Surgical History:  Procedure Laterality Date   COLONOSCOPY WITH PROPOFOL N/A 06/18/2014   Procedure: COLONOSCOPY WITH PROPOFOL;  Surgeon: Charolett Bumpers, MD;  Location: [facility];  Service: Endoscopy;  Laterality: N/A;   ROTATOR CUFF REPAIR Right 10 yrs ago   SEPTOPLASTY     TUBAL LIGATION       A IV  Location/Drains/Wounds Patient Lines/Drains/Airways Status     Active Line/Drains/Airways     Name Placement date Placement time Site Days   Peripheral IV 09/06/23 20 G Anterior;Proximal;Right Forearm 09/06/23  1710  Forearm  less than 1            Intake/Output Last 24 hours No intake or output data in the 24 hours ending 09/06/23 2144  Labs/Imaging Results for orders placed or performed during the hospital encounter of 09/06/23 (from the past 48 hour(s))  Basic metabolic panel     Status: None   Collection Time: 09/06/23  3:26 PM  Result Value Ref Range   Sodium 141 135 - 145 mmol/L   Potassium 4.5 3.5 - 5.1 mmol/L   Chloride 106 98 - 111 mmol/L   CO2 25 22 - 32 mmol/L   Glucose, Bld 96 70 - 99 mg/dL    Comment: Glucose reference range applies only to samples taken after fasting for at least 8 hours.   BUN 11 8 - 23 mg/dL   Creatinine, Ser 1.32 0.44 - 1.00 mg/dL   Calcium 9.3 8.9 - 44.0 mg/dL   GFR, Estimated >10 >27 mL/min    Comment: (NOTE) Calculated using the CKD-EPI Creatinine Equation (2021)    Anion gap 10 5 - 15    Comment: Performed at [facility], [address] [place], [place] [address]  CBC     Status: None   Collection Time: 09/06/23  3:26 PM  Result Value  Ref Range   WBC 7.1 4.0 - 10.5 K/uL   RBC 5.01 3.87 - 5.11 MIL/uL   Hemoglobin 14.8 12.0 - 15.0 g/dL   HCT 32.9 51.8 - 84.1 %   MCV 89.2 80.0 - 100.0 fL   MCH 29.5 26.0 - 34.0 pg   MCHC 33.1 30.0 - 36.0 g/dL   RDW 66.0 63.0 - 16.0 %   Platelets 251 150 - 400 K/uL   nRBC 0.0 0.0 - 0.2 %    Comment: Performed at [facility], [address], [place], [place] [address]  Troponin I (High Sensitivity)     Status: Abnormal   Collection Time: 09/06/23  3:26 PM  Result Value Ref Range   Troponin I (High Sensitivity) 103 (HH) <18 ng/L    Comment: CRITICAL RESULT CALLED TO, READ BACK BY AND VERIFIED WITH K.CHAPLIN,PARAMEDIC @1702  09/06/2023 VANG.J (NOTE) Elevated high sensitivity troponin  I (hsTnI) values and significant  changes across serial measurements may suggest ACS but many other  chronic and acute conditions are known to elevate hsTnI results.  Refer to the "Links" section for chest pain algorithms and additional  guidance. Performed at [facility], [address] [place], [place] [address]   Troponin I (High Sensitivity)     Status: Abnormal   Collection Time: 09/06/23  5:10 PM  Result Value Ref Range   Troponin I (High Sensitivity) 111 (HH) <18 ng/L    Comment: CRITICAL VALUE NOTED.  VALUE IS CONSISTENT WITH PREVIOUSLY REPORTED AND CALLED VALUE. (NOTE) Elevated high sensitivity troponin I (hsTnI) values and significant  changes across serial measurements may suggest ACS but many other  chronic and acute conditions are known to elevate hsTnI results.  Refer to the "Links" section for chest pain algorithms and additional  guidance. Performed at [facility], [address], [place], [place] [address]   SARS Coronavirus 2 by RT PCR (hospital order, performed in [facility] hospital lab) *cepheid single result test* Anterior Nasal Swab     Status: None   Collection Time: 09/06/23  5:10 PM   Specimen: Anterior Nasal Swab  Result Value Ref Range   SARS Coronavirus 2 by RT PCR NEGATIVE NEGATIVE    Comment: Performed at [facility], [address] [place], [place] [address]  CBG monitoring, ED     Status: Abnormal   Collection Time: 09/06/23  9:36 PM  Result Value Ref Range   Glucose-Capillary 260 (H) 70 - 99 mg/dL    Comment: Glucose reference range applies only to samples taken after fasting for at least 8 hours.   DG Chest 2 View  Result Date: 09/06/2023 CLINICAL DATA:  Chest pain. EXAM: CHEST - 2 VIEW COMPARISON:  February 08, 2018. FINDINGS: The heart size and mediastinal contours are within normal limits. Both lungs are clear. The visualized skeletal structures are unremarkable. IMPRESSION: No active cardiopulmonary disease.  Electronically Signed   By: Lupita Raider M.D.   On: 09/06/2023 17:02    Pending Labs Unresulted Labs (From admission, onward)     Start     Ordered   09/08/23 0500  Heparin level (unfractionated)  Daily,   R     Placed in "And" Linked Group   09/06/23 1802   09/07/23 0500  CBC  Daily,   R     Placed in "And" Linked Group   09/06/23 1802   09/07/23 0500  Hemoglobin A1c  Tomorrow morning,   R  09/06/23 1822   09/07/23 0500  Lipid panel  Tomorrow morning,   R        09/06/23 1822   09/07/23 0100  Heparin level (unfractionated)  Once-Timed,   URGENT        09/06/23 1802   09/06/23 1823  Lipoprotein A (LPA)  Once,   URGENT        09/06/23 1822   09/06/23 1822  TSH  Once,   URGENT        09/06/23 1822   Signed and Held  HIV Antibody (routine testing w rflx)  (HIV Antibody (Routine testing w reflex) panel)  Tomorrow morning,   R        Signed and Held   Signed and Held  Basic metabolic panel  Daily,   R      Signed and Held            Vitals/Pain Today's Vitals   09/06/23 1900 09/06/23 2044 09/06/23 2045 09/06/23 2100  BP: (!) 156/71 (!) 153/71 (!) 153/71 (!) 147/70  Pulse: 81 (!) 103 (!) 104 100  Resp: 15 17    Temp:  98 F (36.7 C)    TempSrc:  Oral    SpO2: 94% 95% 94% 95%  Weight:      Height:      PainSc:        Isolation Precautions No active isolations  Medications Medications  heparin ADULT infusion 100 units/mL (25000 units/221mL) (1,100 Units/hr Intravenous New Bag/Given 09/06/23 1826)  aspirin EC tablet 81 mg (has no administration in time range)  atorvastatin (LIPITOR) tablet 80 mg (80 mg Oral Not Given 09/06/23 2105)  metoprolol tartrate (LOPRESSOR) tablet 25 mg (has no administration in time range)  amLODipine (NORVASC) tablet 2.5 mg (has no administration in time range)  irbesartan (AVAPRO) tablet 300 mg (300 mg Oral Not Given 09/06/23 2107)  insulin aspart (novoLOG) injection 0-6 Units (3 Units Subcutaneous Given 09/06/23 2142)  insulin  glargine-yfgn (SEMGLEE) injection 10 Units (has no administration in time range)  aspirin tablet 325 mg (325 mg Oral Given 09/06/23 1739)  heparin bolus via infusion 4,000 Units (4,000 Units Intravenous Bolus from Bag 09/06/23 1826)    Mobility walks     Focused Assessments Cardiac Assessment Handoff:  Cardiac Rhythm: Normal sinus rhythm Lab Results  Component Value Date   CKTOTAL 140 02/08/2018   TROPONINI <0.03 02/08/2018   No results found for: "DDIMER" Does the Patient currently have chest pain? No    R Recommendations: See Admitting Provider Note  Report given to:   Additional Notes: patient is A&O X4, walks independently.

## 2023-09-06 NOTE — ED Triage Notes (Signed)
Pt sent by walk in clinic for abnormal EKG. Pt c/o non radiating midsternal chest pain, SOBx3d. Pt denies N/V.

## 2023-09-06 NOTE — Consult Note (Signed)
Cardiology Consultation   Patient ID: Kerry Compton MRN: [id number]; DOB: 1956/01/30  Admit date: 09/06/2023 Date of Consult: 09/06/2023  PCP:  Laurann Montana, MD   [facility] Providers Cardiologist: Previously seen by Dr. Antoine Poche in 04/2020  Patient Profile:   Kerry Compton is a 67 y.o. female with a history of chest pain with negative ETT in 05/2020, hypertension, hyperlipidemia, diabetes mellitus, asthma, and remote Hepatitis A,  who is being seen 09/06/2023 for the evaluation of chest pain at the request of Dr. Particia Nearing.  History of Present Illness:   [name] is a 67 year old female with the above history. She was previously seen by Dr. Antoine Poche in 04/2020 for chest pain. ETT was ordered and was negative. She has not been seen by Cardiology since that time. She has a history of hypertension, hyperlipidemia, and diabetes. She has had diabetes for 30s but it is currently well controlled on an insulin pump.   She presented to the ED today for further evaluation of chest pain. Patient was in her usual state of health until Saturday 09/03/2023 when she was at her grandsons ball game. She had to walk a long ways from the parking lot to the field and had to go up hill. While walking up hill, she developed sudden onset of substernal chest pressure/ tightness with associated shortness of breath. She stop and drank some water and pain resolved. She had no recurrent chest pain that day. However, she had recurrent symptoms yesterday 09/05/2023 when walking up the stairs to get to jury duty. Pain again went away when she stopped; however, she had a total of 5 episodes of chest pain that day.  She reports ongoing symptoms of exertional chest pressure.  They have continued to occur with minimal activity.  They have occurred daily.  Symptoms are quite concerning for unstable angina and non-STEMI with elevated troponin.  Upon arrival to the ED, patient hypertensive with BP as  high as 182/83. EKG showed normal sinus rhythm with anteroseptal infarct and mild isolated T wave inversions in lead aVL. Initial high-sensitivity troponin elevated at 103. Repeat pending. Chest x-ray showed no acute findings. WBC 7.1, Hgb 14.8, Plts 251. Na 141, K 4.5, Glucose 96, BUN 11, Cr 0.66.   She denies any history of tobacco, alcohol, or drug use. She does have a significant family history of CAD/ MI in her brothers and mother.    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 Authorizing Provider  albuterol (PROVENTIL HFA;VENTOLIN HFA) 108 (90 BASE) MCG/ACT inhaler Inhale into the lungs every 6 (six) hours as needed for wheezing or shortness of breath.    [provider]  amLODipine (NORVASC) 2.5 MG tablet Take 2.5 mg by mouth daily. 07/02/22   [provider]  amoxicillin-clavulanate (AUGMENTIN) 875-125 MG tablet Take 1 tablet by  mouth every 12 (twelve) hours. 06/15/20   Robinson, [place] N, PA-C  atorvastatin (LIPITOR) 80 MG tablet Take 80 mg by mouth daily.      [provider]  Cholecalciferol (VITAMIN D3) 50 MCG (2000 UT) capsule 1 tablet    [provider]  Cyanocobalamin (VITAMIN B12) 1000 MCG TBCR 1 tablet 01/13/21   [provider]  fexofenadine (ALLEGRA) 180 MG tablet Take 180 mg by mouth daily.    [provider]  fluticasone (FLOVENT HFA) 44 MCG/ACT inhaler Inhale 1 puff into the lungs 2  (two) times daily.    [provider]  insulin glargine (LANTUS) 100 UNIT/ML injection Inject 15 Units into the skin daily as needed (if bs is too high).     [provider]  Insulin Human (INSULIN PUMP) 100 unit/ml SOLN Inject into the skin continuous. Humalog insulin pump basal rate 1200 am to 300 am = 1.0 600 am to 600 pm = 0.90    [provider]  meclizine (ANTIVERT) 25 MG tablet Take 12.5-25 mg by mouth every 8 (eight) hours as needed. Patient not taking: Reported on 07/07/2022 07/02/22   [provider]  Multiple Vitamin (MULTIVITAMIN WITH MINERALS) TABS tablet Take 1 tablet by mouth daily.    [provider]  omeprazole (PRILOSEC) 20 MG capsule 1 capsule 30 minutes before morning meal    [provider]  oxybutynin (DITROPAN-XL) 10 MG 24 hr tablet Take 10 mg by mouth daily. 07/02/22   [provider]  valsartan (DIOVAN) 320 MG tablet Take 320 mg by mouth daily.    [provider]    Inpatient Medications: Scheduled Meds:  Continuous Infusions:  PRN Meds:   Allergies:    Allergies  Allergen Reactions   Azithromycin Diarrhea and Nausea And Vomiting   Indocin [Indomethacin] Other (See Comments)    Light headed and loopy     Social History:   Social History   Socioeconomic History   Marital status: Single    Spouse name: Not on file   Number of children: 2   Years of education: Not on file   Highest education level: Not on file  Occupational History   Occupation: Aeronautical engineer.  Tobacco Use   Smoking status: Never   Smokeless tobacco: Never  Vaping Use   Vaping status: Never Used  Substance and Sexual Activity   Alcohol use: No    Alcohol/week: 0.0 standard drinks of alcohol   Drug use: No   Sexual activity: Not on file  Other Topics Concern   Not on file  Social History Narrative   Divorced, lives with son.   Social Determinants of Health   Financial Resource Strain: Not on file   Food Insecurity: Not on file  Transportation Needs: Not on file  Physical Activity: Not on file  Stress: Not on file  Social Connections: Unknown (04/14/2022)   Received from [facility]   Social Network    Social Network: Not on file  Intimate Partner Violence: Unknown (03/17/2022)   [facility] from [facility], [facility]   HITS    Physically Hurt: Not on file    Insult or Talk Down To: Not on file    Threaten Physical Harm: Not on file    Scream or Curse: Not on file    Family History:   Family History  Problem Relation Age of Onset   Diabetes Mother    Asthma Mother    Arthritis Mother  Diabetes Brother    Diabetes Maternal Grandmother    Diabetes Brother    Hypertension Brother    Hyperlipidemia Brother      ROS:  Please see the history of present illness.   All other ROS reviewed and negative.     Physical Exam/Data:   Vitals:   09/06/23 1502 09/06/23 1504 09/06/23 1636  BP: (!) 182/83  (!) 157/69  Pulse: 88  80  Resp: 18  19  Temp: 98.2 F (36.8 C)  98.2 F (36.8 C)  SpO2: 99%  100%  Weight:  97.5 kg   Height:  5\' 5"  (1.651 m)    No intake or output data in the 24 hours ending 09/06/23 1752    09/06/2023    3:04 PM 06/15/2020    2:43 PM 05/08/2020    2:18 PM  Last 3 Weights  Weight (lbs) 214 lb 15.2 oz 215 lb 221 lb 6.4 oz  Weight (kg) 97.5 kg 97.523 kg 100.426 kg     Body mass index is 35.77 kg/m.  General:  Well nourished, well developed, in no acute distress HEENT: normal Neck: no JVD Vascular: No carotid bruits; Distal pulses 2+ bilaterally Cardiac:  normal S1, S2; RRR; no murmur  Lungs:  clear to auscultation bilaterally, no wheezing, rhonchi or rales  Abd: soft, nontender, no hepatomegaly  Ext: no edema Musculoskeletal:  No deformities, BUE and BLE strength normal and equal Skin: warm and dry  Neuro:  CNs 2-12 intact, no focal abnormalities noted Psych:  Normal affect   EKG:  The EKG was personally reviewed and  demonstrates:  Normal sinus rhythm with Q waves in anteroseptal leads and mild isolated T wave inversions in lead aVL. Telemetry:  Telemetry was personally reviewed and demonstrates:  Normal sinus rhythm in the 80s.  Relevant CV Studies:  Exercise Tolerance Test 05/29/2020: Adequate exercise capacity, achieved 8.5 METs Upsloping ST depressions with stress No evidence of ischemia  Laboratory Data:  High Sensitivity Troponin:   Recent Labs  Lab 09/06/23 1526  TROPONINIHS 103*     Chemistry Recent Labs  Lab 09/06/23 1526  NA 141  K 4.5  CL 106  CO2 25  GLUCOSE 96  BUN 11  CREATININE 0.66  CALCIUM 9.3  GFRNONAA >60  ANIONGAP 10    No results for input(s): "PROT", "ALBUMIN", "AST", "ALT", "ALKPHOS", "BILITOT" in the last 168 hours. Lipids No results for input(s): "CHOL", "TRIG", "HDL", "LABVLDL", "LDLCALC", "CHOLHDL" in the last 168 hours.  Hematology Recent Labs  Lab 09/06/23 1526  WBC 7.1  RBC 5.01  HGB 14.8  HCT 44.7  MCV 89.2  MCH 29.5  MCHC 33.1  RDW 12.6  PLT 251   Thyroid No results for input(s): "TSH", "FREET4" in the last 168 hours.  BNPNo results for input(s): "BNP", "PROBNP" in the last 168 hours.  DDimer No results for input(s): "DDIMER" in the last 168 hours.   Radiology/Studies:  DG Chest 2 View  Result Date: 09/06/2023 CLINICAL DATA:  Chest pain. EXAM: CHEST - 2 VIEW COMPARISON:  February 08, 2018. FINDINGS: The heart size and mediastinal contours are within normal limits. Both lungs are clear. The visualized skeletal structures are unremarkable. IMPRESSION: No active cardiopulmonary disease. Electronically Signed   By: Lupita Raider M.D.   On: 09/06/2023 17:02     Assessment and Plan:   # NSTEMI -Presents with accelerating angina.  Troponins are elevated.  Diagnosis is non-STEMI.  EKG shows no ST elevation.  She does have  what appears to be a new Q waves in the anterior leads.  Could represent old anteroseptal infarct.  -No further chest  discomfort.  Continue aspirin 81 mg daily.  She did receive 325 in the ER.  Start heparin drip.  We will add metoprolol to tartrate 25 mg twice daily. -She will need to check lipids, TSH and A1c.  Kidney function is stable. -She needs an echo. -We did discuss risk and benefits of left heart catheterization.  She is willing to proceed. -Continue Lipitor 80.  Further titration pending repeat lipid panel.  Check LP(a).  Informed Consent   Shared Decision Making/Informed Consent The risks [stroke (1 in 1000), death (1 in 1000), kidney failure [usually temporary] (1 in 500), bleeding (1 in 200), allergic reaction [possibly serious] (1 in 200)], benefits (diagnostic support and management of coronary artery disease) and alternatives of a cardiac catheterization were discussed in detail with Ms. Hsu and she is willing to proceed.    # Diabetes with insulin pump -Recommended admission to the hospitalist service to assist with her diabetes management.  This seems complex for cardiology service.  # HTN -Add metoprolol.  Continue home antihypertensive regimen.  For questions or updates, please contact [facility] Please consult [email or web address] for contact info under   [employer]. Flora Lipps, MD, [facility]  [facility]  [address], [address] [place], [place] [address] [phone]  6:24 PM

## 2023-09-06 NOTE — ED Provider Notes (Signed)
[place] EMERGENCY DEPARTMENT AT [facility] Provider Note   CSN: [id number] Arrival date & time: 09/06/23  1457     History  No chief complaint on file.   Kerry Compton is a 67 y.o. female.  Pt is a 67 yo female with pmhx significant for dm, htn, hld, and asthma.  Pt said she's been having exertional cp since Saturday, 9/21.  Pt said it goes away with rest.  She had a little cp this am, so she went to UC.  UC sent her here for further eval.  Pt has no cp now.  Pt has never had heart problems in the past, but all her family members do.       Home Medications Prior to Admission medications   Medication Sig Start Date End Date Taking? Authorizing Provider  albuterol (PROVENTIL HFA;VENTOLIN HFA) 108 (90 BASE) MCG/ACT inhaler Inhale into the lungs every 6 (six) hours as needed for wheezing or shortness of breath.    [provider]  amLODipine (NORVASC) 2.5 MG tablet Take 2.5 mg by mouth daily. 07/02/22   [provider]  amoxicillin-clavulanate (AUGMENTIN) 875-125 MG tablet Take 1 tablet by mouth every 12 (twelve) hours. 06/15/20   Robinson, [place] N, PA-C  atorvastatin (LIPITOR) 80 MG tablet Take 80 mg by mouth daily.      [provider]  Cholecalciferol (VITAMIN D3) 50 MCG (2000 UT) capsule 1 tablet    [provider]  Cyanocobalamin (VITAMIN B12) 1000 MCG TBCR 1 tablet 01/13/21   [provider]  fexofenadine (ALLEGRA) 180 MG tablet Take 180 mg by mouth daily.    [provider]  fluticasone (FLOVENT HFA) 44 MCG/ACT inhaler Inhale 1 puff into the lungs 2 (two) times daily.    [provider]  insulin glargine (LANTUS) 100 UNIT/ML injection Inject 15 Units into the skin daily as needed (if bs is too high).     [provider]  Insulin Human (INSULIN PUMP) 100 unit/ml SOLN Inject into the skin continuous. Humalog insulin pump basal rate 1200 am to 300 am = 1.0 600 am to 600 pm = 0.90    [provider]  meclizine (ANTIVERT) 25 MG tablet Take 12.5-25 mg by mouth every 8 (eight) hours as needed. Patient not taking: Reported on 07/07/2022 07/02/22   [provider]  Multiple Vitamin (MULTIVITAMIN WITH MINERALS) TABS tablet Take 1 tablet by mouth daily.    [provider]  omeprazole (PRILOSEC) 20 MG capsule 1 capsule 30 minutes before morning meal    [provider]  oxybutynin (DITROPAN-XL) 10 MG 24 hr tablet Take 10 mg by mouth daily. 07/02/22   [provider]  valsartan (DIOVAN) 320 MG tablet Take 320 mg by mouth daily.    [provider]      Allergies    Azithromycin and Indocin [indomethacin]    Review of Systems   Review of Systems  Cardiovascular:  Positive for chest pain.  All other systems reviewed and are negative.   Physical Exam Updated Vital Signs BP (!) 156/71   Pulse 81   Temp 98.2 F (36.8 C)   Resp 15   Ht 5\' 5"  (1.651 m)   Wt 97.5 kg   SpO2 94%   BMI 35.77 kg/m  Physical Exam Vitals and nursing note reviewed.  Constitutional:      Appearance: Normal appearance.  HENT:     Head: Normocephalic and atraumatic.     Right Ear:  External ear normal.     Left Ear: External ear normal.     Nose: Nose normal.     Mouth/Throat:     Mouth: Mucous membranes are moist.     Pharynx: Oropharynx is clear.  Eyes:     Extraocular Movements: Extraocular movements intact.     Conjunctiva/sclera: Conjunctivae normal.     Pupils: Pupils are equal, round, and reactive to light.  Cardiovascular:     Rate and Rhythm: Normal rate and regular rhythm.     Pulses: Normal pulses.     Heart sounds: Normal heart sounds.  Pulmonary:     Effort: Pulmonary effort is normal.     Breath sounds: Normal breath sounds.  Abdominal:     General: Abdomen is flat. Bowel sounds are normal.     Palpations: Abdomen is soft.  Musculoskeletal:        General: Normal range of motion.     Cervical back: Normal range of motion and neck  supple.  Skin:    General: Skin is warm.     Capillary Refill: Capillary refill takes less than 2 seconds.  Neurological:     General: No focal deficit present.     Mental Status: She is alert and oriented to person, place, and time.  Psychiatric:        Mood and Affect: Mood normal.        Behavior: Behavior normal.     ED Results / Procedures / Treatments   Labs (all labs ordered are listed, but only abnormal results are displayed) Labs Reviewed  TROPONIN I (HIGH SENSITIVITY) - Abnormal; Notable for the following components:      Result Value   Troponin I (High Sensitivity) 103 (*)    All other components within normal limits  TROPONIN I (HIGH SENSITIVITY) - Abnormal; Notable for the following components:   Troponin I (High Sensitivity) 111 (*)    All other components within normal limits  SARS CORONAVIRUS 2 BY RT PCR  BASIC METABOLIC PANEL  CBC  HEPARIN LEVEL (UNFRACTIONATED)  CBC  TSH  HEMOGLOBIN A1C  LIPID PANEL  LIPOPROTEIN A (LPA)    EKG EKG Interpretation Date/Time:  Tuesday September 06 2023 15:04:48 EDT Ventricular Rate:  91 PR Interval:  144 QRS Duration:  80 QT Interval:  342 QTC Calculation: 420 R Axis:   14  Text Interpretation: Normal sinus rhythm Septal infarct , age undetermined Abnormal ECG When compared with ECG of 07-Feb-2018 19:20, PREVIOUS ECG IS PRESENT No significant change since last tracing Confirmed by Jacalyn Lefevre [phone]) on 09/06/2023 4:44:20 PM  Radiology DG Chest 2 View  Result Date: 09/06/2023 CLINICAL DATA:  Chest pain. EXAM: CHEST - 2 VIEW COMPARISON:  February 08, 2018. FINDINGS: The heart size and mediastinal contours are within normal limits. Both lungs are clear. The visualized skeletal structures are unremarkable. IMPRESSION: No active cardiopulmonary disease. Electronically Signed   By: Lupita Raider M.D.   On: 09/06/2023 17:02    Procedures Procedures    Medications Ordered in ED Medications  heparin ADULT infusion  100 units/mL (25000 units/236mL) (1,100 Units/hr Intravenous New Bag/Given 09/06/23 1826)  aspirin EC tablet 81 mg (has no administration in time range)  atorvastatin (LIPITOR) tablet 80 mg (has no administration in time range)  metoprolol tartrate (LOPRESSOR) tablet 25 mg (has no administration in time range)  amLODipine (NORVASC) tablet 2.5 mg (has no administration in time range)  irbesartan (AVAPRO) tablet 300 mg (has no administration in time range)  aspirin tablet 325 mg (325 mg Oral Given 09/06/23 1739)  heparin bolus via infusion 4,000 Units (4,000 Units Intravenous Bolus from Bag 09/06/23 1826)    ED Course/ Medical Decision Making/ A&P                                 Medical Decision Making Amount and/or Complexity of Data Reviewed Labs: ordered. Radiology: ordered.  Risk OTC drugs. Prescription drug management. Decision regarding hospitalization.   This patient presents to the ED for concern of cp, this involves an extensive number of treatment options, and is a complaint that carries with it a high risk of complications and morbidity.  The differential diagnosis includes cardiac, pulm, gi   Co morbidities that complicate the patient evaluation  dm, htn, hld, and asthma   Additional history obtained:  Additional history obtained from epic chart review  Lab Tests:  I Ordered, and personally interpreted labs.  The pertinent results include:  cbc nl, bmp nl, trop elevated at 103   Imaging Studies ordered:  I ordered imaging studies including cxr  I independently visualized and interpreted imaging which showed No active cardiopulmonary disease.  I agree with the radiologist interpretation   Cardiac Monitoring:  The patient was maintained on a cardiac monitor.  I personally viewed and interpreted the cardiac monitored which showed an underlying rhythm of: nsr   Medicines ordered and prescription drug management:  I ordered medication including asa, heparin   for nstemi  Reevaluation of the patient after these medicines showed that the patient improved I have reviewed the patients home medicines and have made adjustments as needed   Consultations Obtained:  I requested consultation with the cardiologist (Dr. Flora Lipps),  and discussed lab and imaging findings as well as pertinent plan -he will see pt in consult.  He recommended starting heparin. Pt d/w Dr. Antionette Char (triad) for admission   Problem List / ED Course:  NSTEMI:  pt has a good story. Heparin started.  Pt will be admitted with cards consult.   Reevaluation:  After the interventions noted above, I reevaluated the patient and found that they have :improved   Social Determinants of Health:  Lives at home   Dispostion:  After consideration of the diagnostic results and the patients response to treatment, I feel that the patent would benefit from admission.          Final Clinical Impression(s) / ED Diagnoses Final diagnoses:  NSTEMI (non-ST elevated myocardial infarction) [facility])    Rx / DC Orders ED Discharge Orders     None         Jacalyn Lefevre, MD 09/06/23 [phone]

## 2023-09-06 NOTE — ED Notes (Signed)
Per Dr Antionette Char collect TSH and lipid panel at 0100 with heparin level

## 2023-09-07 ENCOUNTER — Inpatient Hospital Stay (HOSPITAL_COMMUNITY)
Admission: EM | Disposition: A | Discharge status: Home / Self Care | Payer: Self-pay | Source: Home / Self Care | Attending: Surgery

## 2023-09-07 ENCOUNTER — Inpatient Hospital Stay (HOSPITAL_COMMUNITY): Payer: Medicare Other

## 2023-09-07 ENCOUNTER — Encounter (HOSPITAL_COMMUNITY): Payer: Self-pay | Admitting: Family Medicine

## 2023-09-07 DIAGNOSIS — I214 Non-ST elevation (NSTEMI) myocardial infarction: Secondary | ICD-10-CM

## 2023-09-07 DIAGNOSIS — I2510 Atherosclerotic heart disease of native coronary artery without angina pectoris: Secondary | ICD-10-CM | POA: Diagnosis not present

## 2023-09-07 HISTORY — PX: LEFT HEART CATH AND CORONARY ANGIOGRAPHY: CATH118249

## 2023-09-07 LAB — LIPID PANEL
Cholesterol: 137 mg/dL (ref 0–200)
HDL: 50 mg/dL (ref >40)
LDL Cholesterol: 77 mg/dL (ref 0–99)
Total CHOL/HDL Ratio: 2.7 RATIO
Triglycerides: 50 mg/dL (ref <150)
VLDL: 10 mg/dL (ref 0–40)

## 2023-09-07 LAB — HEMOGLOBIN A1C
Hgb A1c MFr Bld: 7 % — ABNORMAL HIGH (ref 4.8–5.6)
Mean Plasma Glucose: 154.2 mg/dL

## 2023-09-07 LAB — BASIC METABOLIC PANEL
Anion gap: 8 (ref 5–15)
BUN: 14 mg/dL (ref 8–23)
CO2: 24 mmol/L (ref 22–32)
Calcium: 8.5 mg/dL — ABNORMAL LOW (ref 8.9–10.3)
Chloride: 104 mmol/L (ref 98–111)
Creatinine, Ser: 0.75 mg/dL (ref 0.44–1.00)
GFR, Estimated: >60 mL/min (ref >60)
Glucose, Bld: 271 mg/dL — ABNORMAL HIGH (ref 70–99)
Potassium: 3.8 mmol/L (ref 3.5–5.1)
Sodium: 136 mmol/L (ref 135–145)

## 2023-09-07 LAB — HEPARIN LEVEL (UNFRACTIONATED): Heparin Unfractionated: 0.53 IU/mL (ref 0.30–0.70)

## 2023-09-07 LAB — ECHOCARDIOGRAM COMPLETE
Area-P 1/2: 3.46 cm2
Calc EF: 53.3 %
Height: 64 in
MV M vel: 2.82 m/s
MV Peak grad: 31.7 mmHg
S' Lateral: 2.9 cm
Single Plane A2C EF: 45.3 %
Single Plane A4C EF: 59.3 %
Weight: 3298.08 oz

## 2023-09-07 LAB — GLUCOSE, CAPILLARY
Glucose-Capillary: 128 mg/dL — ABNORMAL HIGH (ref 70–99)
Glucose-Capillary: 179 mg/dL — ABNORMAL HIGH (ref 70–99)
Glucose-Capillary: 185 mg/dL — ABNORMAL HIGH (ref 70–99)
Glucose-Capillary: 223 mg/dL — ABNORMAL HIGH (ref 70–99)
Glucose-Capillary: 293 mg/dL — ABNORMAL HIGH (ref 70–99)
Glucose-Capillary: 318 mg/dL — ABNORMAL HIGH (ref 70–99)

## 2023-09-07 LAB — CBC
HCT: 37.5 % (ref 36.0–46.0)
Hemoglobin: 12.3 g/dL (ref 12.0–15.0)
MCH: 28.5 pg (ref 26.0–34.0)
MCHC: 32.8 g/dL (ref 30.0–36.0)
MCV: 86.8 fL (ref 80.0–100.0)
Platelets: 165 10*3/uL (ref 150–400)
RBC: 4.32 MIL/uL (ref 3.87–5.11)
RDW: 12.6 % (ref 11.5–15.5)
WBC: 9.6 10*3/uL (ref 4.0–10.5)
nRBC: 0 % (ref 0.0–0.2)

## 2023-09-07 LAB — TSH: TSH: 0.967 u[IU]/mL (ref 0.350–4.500)

## 2023-09-07 LAB — POCT ACTIVATED CLOTTING TIME: Activated Clotting Time: 336 seconds

## 2023-09-07 LAB — HIV ANTIBODY (ROUTINE TESTING W REFLEX): HIV Screen 4th Generation wRfx: NONREACTIVE

## 2023-09-07 LAB — MRSA NEXT GEN BY PCR, NASAL: MRSA by PCR Next Gen: NOT DETECTED

## 2023-09-07 SURGERY — LEFT HEART CATH AND CORONARY ANGIOGRAPHY
Anesthesia: LOCAL

## 2023-09-07 MED ORDER — SPIRONOLACTONE 25 MG PO TABS
25.0000 mg | ORAL_TABLET | Freq: Every day | ORAL | Status: DC
  Administered 2023-09-08: 25 mg via ORAL
  Filled 2023-09-07: qty 1

## 2023-09-07 MED ORDER — HYDRALAZINE HCL 20 MG/ML IJ SOLN: 10.0000 mg | INTRAMUSCULAR | Status: AC | PRN

## 2023-09-07 MED ORDER — MIDAZOLAM HCL 2 MG/2ML IJ SOLN
INTRAMUSCULAR | Status: DC | PRN
  Administered 2023-09-07: 1 mg via INTRAVENOUS

## 2023-09-07 MED ORDER — SODIUM CHLORIDE 0.9% FLUSH
3.0000 mL | Freq: Two times a day (BID) | INTRAVENOUS | Status: DC
  Administered 2023-09-07 – 2023-09-08 (×3): 3 mL via INTRAVENOUS

## 2023-09-07 MED ORDER — FENTANYL CITRATE (PF) 100 MCG/2ML IJ SOLN
INTRAMUSCULAR | Status: DC | PRN
  Administered 2023-09-07: 25 ug via INTRAVENOUS

## 2023-09-07 MED ORDER — HEPARIN SODIUM (PORCINE) 1000 UNIT/ML IJ SOLN
INTRAMUSCULAR | Status: AC
  Filled 2023-09-07: qty 10

## 2023-09-07 MED ORDER — SODIUM CHLORIDE 0.9% FLUSH: 3.0000 mL | INTRAVENOUS | Status: DC | PRN

## 2023-09-07 MED ORDER — METOPROLOL SUCCINATE ER 25 MG PO TB24
25.0000 mg | ORAL_TABLET | Freq: Every day | ORAL | Status: DC
  Administered 2023-09-08: 25 mg via ORAL
  Filled 2023-09-07: qty 1

## 2023-09-07 MED ORDER — INSULIN ASPART 100 UNIT/ML IJ SOLN
4.0000 [IU] | Freq: Three times a day (TID) | INTRAMUSCULAR | Status: DC
  Administered 2023-09-07 – 2023-09-08 (×4): 4 [IU] via SUBCUTANEOUS

## 2023-09-07 MED ORDER — VERAPAMIL HCL 2.5 MG/ML IV SOLN
INTRAVENOUS | Status: AC
  Filled 2023-09-07: qty 2

## 2023-09-07 MED ORDER — FENTANYL CITRATE (PF) 100 MCG/2ML IJ SOLN
INTRAMUSCULAR | Status: AC
  Filled 2023-09-07: qty 2

## 2023-09-07 MED ORDER — INSULIN ASPART 100 UNIT/ML IJ SOLN
0.0000 [IU] | INTRAMUSCULAR | Status: DC
  Administered 2023-09-07: 2 [IU] via SUBCUTANEOUS
  Administered 2023-09-07: 1 [IU] via SUBCUTANEOUS
  Administered 2023-09-07: 2 [IU] via SUBCUTANEOUS
  Administered 2023-09-07: 7 [IU] via SUBCUTANEOUS
  Administered 2023-09-08: 3 [IU] via SUBCUTANEOUS
  Administered 2023-09-08: 2 [IU] via SUBCUTANEOUS
  Administered 2023-09-08: 3 [IU] via SUBCUTANEOUS
  Administered 2023-09-08: 2 [IU] via SUBCUTANEOUS
  Administered 2023-09-08: 5 [IU] via SUBCUTANEOUS
  Administered 2023-09-09: 2 [IU] via SUBCUTANEOUS
  Administered 2023-09-09: 1 [IU] via SUBCUTANEOUS

## 2023-09-07 MED ORDER — INSULIN GLARGINE-YFGN 100 UNIT/ML ~~LOC~~ SOLN
20.0000 [IU] | Freq: Every day | SUBCUTANEOUS | Status: DC
  Administered 2023-09-07 – 2023-09-08 (×2): 20 [IU] via SUBCUTANEOUS
  Filled 2023-09-07 (×3): qty 0.2

## 2023-09-07 MED ORDER — IOHEXOL 350 MG/ML SOLN
INTRAVENOUS | Status: DC | PRN
  Administered 2023-09-07: 135 mL

## 2023-09-07 MED ORDER — LIDOCAINE HCL (PF) 1 % IJ SOLN
INTRAMUSCULAR | Status: AC
  Filled 2023-09-07: qty 30

## 2023-09-07 MED ORDER — HEPARIN (PORCINE) IN NACL 1000-0.9 UT/500ML-% IV SOLN
INTRAVENOUS | Status: DC | PRN
  Administered 2023-09-07 (×2): 500 mL

## 2023-09-07 MED ORDER — LIDOCAINE HCL (PF) 1 % IJ SOLN
INTRAMUSCULAR | Status: DC | PRN
  Administered 2023-09-07: 5 mL via INTRADERMAL

## 2023-09-07 MED ORDER — MIDAZOLAM HCL 2 MG/2ML IJ SOLN
INTRAMUSCULAR | Status: AC
  Filled 2023-09-07: qty 2

## 2023-09-07 MED ORDER — INSULIN GLARGINE-YFGN 100 UNIT/ML ~~LOC~~ SOLN
15.0000 [IU] | Freq: Every day | SUBCUTANEOUS | Status: DC
  Filled 2023-09-07 (×2): qty 0.15

## 2023-09-07 MED ORDER — VERAPAMIL HCL 2.5 MG/ML IV SOLN
INTRAVENOUS | Status: DC | PRN
  Administered 2023-09-07: 10 mL via INTRA_ARTERIAL

## 2023-09-07 MED ORDER — HEPARIN (PORCINE) 25000 UT/250ML-% IV SOLN
1100.0000 [IU]/h | INTRAVENOUS | Status: DC
  Administered 2023-09-07 – 2023-09-08 (×2): 1100 [IU]/h via INTRAVENOUS
  Filled 2023-09-07 (×2): qty 250

## 2023-09-07 MED ORDER — HEPARIN SODIUM (PORCINE) 1000 UNIT/ML IJ SOLN
INTRAMUSCULAR | Status: DC | PRN
  Administered 2023-09-07: 4000 [IU] via INTRAVENOUS
  Administered 2023-09-07: 3000 [IU] via INTRAVENOUS

## 2023-09-07 MED ORDER — SODIUM CHLORIDE 0.9 % IV SOLN: 250.0000 mL | INTRAVENOUS | Status: DC | PRN

## 2023-09-07 MED ORDER — FUROSEMIDE 10 MG/ML IJ SOLN
20.0000 mg | Freq: Once | INTRAMUSCULAR | Status: AC
  Administered 2023-09-08: 20 mg via INTRAVENOUS
  Filled 2023-09-07: qty 2

## 2023-09-07 SURGICAL SUPPLY — 15 items
CATH 5FR JL3.5 JR4 ANG PIG MP (CATHETERS) IMPLANT
CATH VISTA GUIDE 6FR XBLAD3.5 (CATHETERS) IMPLANT
DEVICE RAD COMP TR BAND LRG (VASCULAR PRODUCTS) IMPLANT
GLIDESHEATH SLEND SS 6F .021 (SHEATH) IMPLANT
GUIDEWIRE INQWIRE 1.5J.035X260 (WIRE) IMPLANT
GUIDEWIRE JUDO 3 190 (WIRE) IMPLANT
INQWIRE 1.5J .035X260CM (WIRE) ×1
KIT ENCORE 26 ADVANTAGE (KITS) IMPLANT
KIT ESSENTIALS PG (KITS) IMPLANT
KIT HEART LEFT (KITS) IMPLANT
PACK CARDIAC CATHETERIZATION (CUSTOM PROCEDURE TRAY) ×2 IMPLANT
TRANSDUCER W/STOPCOCK (MISCELLANEOUS) IMPLANT
TUBING CIL FLEX 10 FLL-RA (TUBING) IMPLANT
WIRE ASAHI PROWATER 180CM (WIRE) IMPLANT
WIRE FIGHTER CROSSING 190CM (WIRE) IMPLANT

## 2023-09-07 NOTE — Progress Notes (Signed)
ANTICOAGULATION CONSULT NOTE - Initial Consult  Pharmacy Consult for heparin Indication: chest pain/ACS  Allergies  Allergen Reactions   Azithromycin Diarrhea and Nausea And Vomiting   Indocin [Indomethacin] Other (See Comments)    Light headed and loopy     Patient Measurements: Height: 5\' 4"  (162.6 cm) Weight: 93.5 kg (206 lb 2.1 oz) IBW/kg (Calculated) : 54.7 Heparin Dosing Weight: 79kg  Vital Signs: Temp: 98.1 F (36.7 C) (09/24 2300) Temp Source: Oral (09/24 2300) BP: 148/78 (09/24 2300) Pulse Rate: 106 (09/24 2300)  Labs: Recent Labs    09/06/23 1526 09/06/23 1710 09/07/23 0202  HGB 14.8  --  12.3  HCT 44.7  --  37.5  PLT 251  --  165  HEPARINUNFRC  --   --  0.53  CREATININE 0.66  --   --   TROPONINIHS 103* 111*  --     Estimated Creatinine Clearance: 76.7 mL/min (by C-G formula based on SCr of 0.66 mg/dL).   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

## 2023-09-07 NOTE — Interval H&P Note (Signed)
History and Physical Interval Note:  09/07/2023 12:26 PM  Kerry Compton  has presented today for surgery, with the diagnosis of nstemi.  The various methods of treatment have been discussed with the patient and family. After consideration of risks, benefits and other options for treatment, the patient has consented to  Procedure(s): LEFT HEART CATH AND CORONARY ANGIOGRAPHY (N/A) as a surgical intervention.  The patient's history has been reviewed, patient examined, no change in status, stable for surgery.  I have reviewed the patient's chart and labs.  Questions were answered to the patient's satisfaction.   Cath Lab Visit (complete for each Cath Lab visit)  Clinical Evaluation Leading to the Procedure:   ACS: Yes.    Non-ACS:    Anginal Classification: CCS III  Anti-ischemic medical therapy: Minimal Therapy (1 class of medications)  Non-Invasive Test Results: No non-invasive testing performed  Prior CABG: No previous CABG        Theron Arista [facility] 09/07/2023 12:26 PM

## 2023-09-07 NOTE — Plan of Care (Signed)
  Problem: Education: Goal: Understanding of CV disease, CV risk reduction, and recovery process will improve Outcome: Progressing Goal: Individualized Educational Video(s) Outcome: Progressing   Problem: Activity: Goal: Ability to return to baseline activity level will improve Outcome: Progressing   Problem: Cardiovascular: Goal: Ability to achieve and maintain adequate cardiovascular perfusion will improve Outcome: Progressing Goal: Vascular access site(s) Level 0-1 will be maintained Outcome: Progressing   Problem: Health Behavior/Discharge Planning: Goal: Ability to safely manage health-related needs after discharge will improve Outcome: Progressing   Problem: Education: Goal: Understanding of cardiac disease, CV risk reduction, and recovery process will improve Outcome: Progressing Goal: Individualized Educational Video(s) Outcome: Progressing   Problem: Activity: Goal: Ability to tolerate increased activity will improve Outcome: Progressing   Problem: Cardiac: Goal: Ability to achieve and maintain adequate cardiovascular perfusion will improve Outcome: Progressing   Problem: Health Behavior/Discharge Planning: Goal: Ability to safely manage health-related needs after discharge will improve Outcome: Progressing   Problem: Education: Goal: Ability to describe self-care measures that may prevent or decrease complications (Diabetes Survival Skills Education) will improve Outcome: Progressing Goal: Individualized Educational Video(s) Outcome: Progressing   Problem: Coping: Goal: Ability to adjust to condition or change in health will improve Outcome: Progressing   Problem: Fluid Volume: Goal: Ability to maintain a balanced intake and output will improve Outcome: Progressing   Problem: Health Behavior/Discharge Planning: Goal: Ability to identify and utilize available resources and services will improve Outcome: Progressing Goal: Ability to manage health-related  needs will improve Outcome: Progressing   Problem: Metabolic: Goal: Ability to maintain appropriate glucose levels will improve Outcome: Progressing   Problem: Nutritional: Goal: Maintenance of adequate nutrition will improve Outcome: Progressing Goal: Progress toward achieving an optimal weight will improve Outcome: Progressing   Problem: Skin Integrity: Goal: Risk for impaired skin integrity will decrease Outcome: Progressing   Problem: Tissue Perfusion: Goal: Adequacy of tissue perfusion will improve Outcome: Progressing   Problem: Education: Goal: Knowledge of General Education information will improve Description: Including pain rating scale, medication(s)/side effects and non-pharmacologic comfort measures Outcome: Progressing   Problem: Health Behavior/Discharge Planning: Goal: Ability to manage health-related needs will improve Outcome: Progressing   Problem: Clinical Measurements: Goal: Ability to maintain clinical measurements within normal limits will improve Outcome: Progressing Goal: Will remain free from infection Outcome: Progressing Goal: Diagnostic test results will improve Outcome: Progressing Goal: Respiratory complications will improve Outcome: Progressing Goal: Cardiovascular complication will be avoided Outcome: Progressing   Problem: Activity: Goal: Risk for activity intolerance will decrease Outcome: Progressing   Problem: Nutrition: Goal: Adequate nutrition will be maintained Outcome: Progressing

## 2023-09-07 NOTE — Inpatient Diabetes Management (Addendum)
Inpatient Diabetes Program Recommendations  AACE/ADA: New Consensus Statement on Inpatient Glycemic Control (2015)  Target Ranges:  Prepandial:   less than 140 mg/dL      Peak postprandial:   less than 180 mg/dL (1-2 hours)      Critically ill patients:  140 - 180 mg/dL    Latest Reference Range & Units 09/06/23 21:36 09/06/23 23:08 09/07/23 04:59 09/07/23 08:59  Glucose-Capillary 70 - 99 mg/dL 161 (H)  3 units Novolog  10 units Semglee 294 (H)  3 units Novolog  293 (H)  3 units Novolog  223 (H)  2 units Novolog   (H): Data is abnormally high    Admit with: CP/ NSTEMI  History: DM  Home DM Meds: Insulin Pump--Medtronic 780g       Guardian 4 CGM  Current Orders: Semglee 10 units at bedtime      Novolog 0-6 units Q4 hours   ENDO: Dr. Sharl Ma with [facility]??  NPO for Cardiac Cath today    MD- Note CBG 223 this AM.  Please consider:  1. Increase Semglee to 15 units at bedtime  2. Increase Novolog SSI to the 0-9 unit scale Q4 hours    Addendum 10:20am--Met w/ pt at bedside prior to Cardiac Cath--Pt A&O and able to have meaningful conversation.  Pt told me she took her Insulin pump off yesterday 9/24.  Has pump in her belongings bag but does not have her insulin pump infusion sites nor reservoirs, nor insulin to resume her pump in hospital.  Discussed with pt that we will keep her off her pump until she goes home since she does not have her pump supplies.  Pt was upset that her CBG was >200 this AM and told me she asked for Novolog Meal Coverage yest when she ate but that no one addressed that with her.  Discussed with pt that I will address her CBGs with the MD and see if we can make some insulin adjustments.  Pt asked me to call her ENDO Dr. Sharl Ma to get her current pump settings (her pump is off and the battery is out right now).  Pt told me she has a delivery of infusion sites coming to her house today but no one will be there to pick them up.  I gave pt a sample of an  older model Mio insertion site which according to [employer] is compatible with her 780g pump--pt told me she used to use the Mio insertion sites and is familiar with them.  I have called Dr. Daune Perch office and asked for Dr. Daune Perch medical assistant to call me back with pt's pump settings.     Addendum 2pm--Dr. Daune Perch office called me back with pt's pump settings: Basal Rates:  12am- 1.2 units/hr 6am- 1.4 units/hr 11am- 1 unit/hr Total Basal per 24 hour period= 27.2 units Carb Ratio: 1 unit for every 5 grams Carbohydrates Correction Factor: 1 unit for every 60 mg/dl above Target CBG Target CBG 100 mg/dl ENDO office does not have insertion site supplies for pt.  She will have to use the site I gave her and wait for her next shipment of insertion sites from the supply company.  I reached out to pt's Medtronic Insulin Pump RN/Rep and also left message requesting sample of Insertion site for pt. Alerted Dr. [place] to the above info and insulin orders adjusted.  Semglee increased to 20 units at bedtime for tonight.  Novolog 4 units Meal coverage to start at [facility].     --  Will follow patient during hospitalization--  Ambrose Finland RN, MSN, CDCES Diabetes Coordinator Inpatient Glycemic Control Team Team Pager: [phone] (8a-5p)

## 2023-09-07 NOTE — Plan of Care (Signed)
  Problem: Education: Goal: Understanding of CV disease, CV risk reduction, and recovery process will improve Outcome: Progressing Goal: Individualized Educational Video(s) Outcome: Progressing   Problem: Activity: Goal: Ability to return to baseline activity level will improve Outcome: Progressing   Problem: Cardiovascular: Goal: Ability to achieve and maintain adequate cardiovascular perfusion will improve Outcome: Progressing Goal: Vascular access site(s) Level 0-1 will be maintained Outcome: Progressing   Problem: Health Behavior/Discharge Planning: Goal: Ability to safely manage health-related needs after discharge will improve Outcome: Progressing   Problem: Education: Goal: Understanding of cardiac disease, CV risk reduction, and recovery process will improve Outcome: Progressing Goal: Individualized Educational Video(s) Outcome: Progressing   Problem: Activity: Goal: Ability to tolerate increased activity will improve Outcome: Progressing   Problem: Cardiac: Goal: Ability to achieve and maintain adequate cardiovascular perfusion will improve Outcome: Progressing   Problem: Health Behavior/Discharge Planning: Goal: Ability to safely manage health-related needs after discharge will improve Outcome: Progressing   Problem: Education: Goal: Ability to describe self-care measures that may prevent or decrease complications (Diabetes Survival Skills Education) will improve Outcome: Progressing Goal: Individualized Educational Video(s) Outcome: Progressing   Problem: Coping: Goal: Ability to adjust to condition or change in health will improve Outcome: Progressing   Problem: Fluid Volume: Goal: Ability to maintain a balanced intake and output will improve Outcome: Progressing   Problem: Health Behavior/Discharge Planning: Goal: Ability to identify and utilize available resources and services will improve Outcome: Progressing Goal: Ability to manage health-related  needs will improve Outcome: Progressing   Problem: Metabolic: Goal: Ability to maintain appropriate glucose levels will improve Outcome: Progressing   Problem: Nutritional: Goal: Maintenance of adequate nutrition will improve Outcome: Progressing Goal: Progress toward achieving an optimal weight will improve Outcome: Progressing   Problem: Skin Integrity: Goal: Risk for impaired skin integrity will decrease Outcome: Progressing   Problem: Tissue Perfusion: Goal: Adequacy of tissue perfusion will improve Outcome: Progressing   Problem: Education: Goal: Knowledge of General Education information will improve Description: Including pain rating scale, medication(s)/side effects and non-pharmacologic comfort measures Outcome: Progressing   Problem: Health Behavior/Discharge Planning: Goal: Ability to manage health-related needs will improve Outcome: Progressing   Problem: Clinical Measurements: Goal: Ability to maintain clinical measurements within normal limits will improve Outcome: Progressing Goal: Will remain free from infection Outcome: Progressing Goal: Diagnostic test results will improve Outcome: Progressing Goal: Respiratory complications will improve Outcome: Progressing Goal: Cardiovascular complication will be avoided Outcome: Progressing   Problem: Activity: Goal: Risk for activity intolerance will decrease Outcome: Progressing   Problem: Nutrition: Goal: Adequate nutrition will be maintained Outcome: Progressing   Problem: Coping: Goal: Level of anxiety will decrease Outcome: Progressing   Problem: Elimination: Goal: Will not experience complications related to bowel motility Outcome: Progressing Goal: Will not experience complications related to urinary retention Outcome: Progressing   Problem: Pain Managment: Goal: General experience of comfort will improve Outcome: Progressing

## 2023-09-07 NOTE — Progress Notes (Signed)
Md at bedside

## 2023-09-07 NOTE — Progress Notes (Signed)
Cardiology Progress Note  Patient ID: Kerry Compton MRN: [id number] DOB: 27-Jul-1956 Date of Encounter: 09/07/2023  Primary Cardiologist: Kerry Harps, MD  Subjective   Chief Complaint: None.   HPI: No further chest pain.  CV exam unremarkable.  ROS:  All other ROS reviewed and negative. Pertinent positives noted in the HPI.     Inpatient Medications  Scheduled Meds:  amLODipine  2.5 mg Oral Daily   aspirin EC  81 mg Oral Daily   atorvastatin  80 mg Oral Daily   insulin aspart  0-6 Units Subcutaneous Q4H   insulin glargine-yfgn  10 Units Subcutaneous QHS   irbesartan  300 mg Oral Daily   metoprolol tartrate  25 mg Oral BID   Continuous Infusions:  sodium chloride 1 mL/kg/hr (09/07/23 0537)   heparin 1,100 Units/hr (09/07/23 0436)   PRN Meds: acetaminophen, nitroGLYCERIN, ondansetron (ZOFRAN) IV   Vital Signs   Vitals:   09/06/23 2154 09/06/23 2300 09/07/23 0455 09/07/23 0728  BP: (!) 162/87 (!) 148/78 (!) 148/77 139/67  Pulse: (!) 113 (!) 106 83 68  Resp:  16 20 13   Temp:  98.1 F (36.7 C) 97.7 F (36.5 C) 97.8 F (36.6 C)  TempSrc:  Oral Oral Oral  SpO2:  93% 96% 93%  Weight:  93.5 kg    Height:  5\' 4"  (1.626 m)      Intake/Output Summary (Last 24 hours) at 09/07/2023 0749 Last data filed at 09/07/2023 0436 Gross per 24 hour  Intake 370.89 ml  Output --  Net 370.89 ml      09/06/2023   11:00 PM 09/06/2023    3:04 PM 06/15/2020    2:43 PM  Last 3 Weights  Weight (lbs) 206 lb 2.1 oz 214 lb 15.2 oz 215 lb  Weight (kg) 93.5 kg 97.5 kg 97.523 kg      Telemetry  Overnight telemetry shows sinus rhythm 70 to 80 bpm, which I personally reviewed.   ECG  The most recent ECG shows normal sinus rhythm, old anteroseptal infarct, which I personally reviewed.   Physical Exam   Vitals:   09/06/23 2154 09/06/23 2300 09/07/23 0455 09/07/23 0728  BP: (!) 162/87 (!) 148/78 (!) 148/77 139/67  Pulse: (!) 113 (!) 106 83 68  Resp:  16 20 13   Temp:  98.1 F  (36.7 C) 97.7 F (36.5 C) 97.8 F (36.6 C)  TempSrc:  Oral Oral Oral  SpO2:  93% 96% 93%  Weight:  93.5 kg    Height:  5\' 4"  (1.626 m)      Intake/Output Summary (Last 24 hours) at 09/07/2023 0749 Last data filed at 09/07/2023 0436 Gross per 24 hour  Intake 370.89 ml  Output --  Net 370.89 ml       09/06/2023   11:00 PM 09/06/2023    3:04 PM 06/15/2020    2:43 PM  Last 3 Weights  Weight (lbs) 206 lb 2.1 oz 214 lb 15.2 oz 215 lb  Weight (kg) 93.5 kg 97.5 kg 97.523 kg    Body mass index is 35.38 kg/m.  General: Well nourished, well developed, in no acute distress Head: Atraumatic, normal size  Eyes: PEERLA, EOMI  Neck: Supple, no JVD Endocrine: No thryomegaly Cardiac: Normal S1, S2; RRR; no murmurs, rubs, or gallops Lungs: Clear to auscultation bilaterally, no wheezing, rhonchi or rales  Abd: Soft, nontender, no hepatomegaly  Ext: No edema, pulses 2+ Musculoskeletal: No deformities, BUE and BLE strength normal and equal Skin: Warm and dry,  no rashes   Neuro: Alert and oriented to person, place, time, and situation, CNII-XII grossly intact, no focal deficits  Psych: Normal mood and affect   Labs  High Sensitivity Troponin:   Recent Labs  Lab 09/06/23 1526 09/06/23 1710  TROPONINIHS 103* 111*     Cardiac EnzymesNo results for input(s): "TROPONINI" in the last 168 hours. No results for input(s): "TROPIPOC" in the last 168 hours.  Chemistry Recent Labs  Lab 09/06/23 1526 09/07/23 0202  NA 141 136  K 4.5 3.8  CL 106 104  CO2 25 24  GLUCOSE 96 271*  BUN 11 14  CREATININE 0.66 0.75  CALCIUM 9.3 8.5*  GFRNONAA >60 >60  ANIONGAP 10 8    Hematology Recent Labs  Lab 09/06/23 1526 09/07/23 0202  WBC 7.1 9.6  RBC 5.01 4.32  HGB 14.8 12.3  HCT 44.7 37.5  MCV 89.2 86.8  MCH 29.5 28.5  MCHC 33.1 32.8  RDW 12.6 12.6  PLT 251 165   BNPNo results for input(s): "BNP", "PROBNP" in the last 168 hours.  DDimer No results for input(s): "DDIMER" in the last 168 hours.    Radiology  DG Chest 2 View  Result Date: 09/06/2023 CLINICAL DATA:  Chest pain. EXAM: CHEST - 2 VIEW COMPARISON:  February 08, 2018. FINDINGS: The heart size and mediastinal contours are within normal limits. Both lungs are clear. The visualized skeletal structures are unremarkable. IMPRESSION: No active cardiopulmonary disease. Electronically Signed   By: Kerry Compton M.D.   On: 09/06/2023 17:02    Cardiac Studies  Echo and left heart cath pending  Patient Profile  [name] is a 67 y.o. female with diabetes, hypertension, hyperlipidemia, asthma admitted on 09/06/2023 for non-STEMI.  Assessment & Plan   # Non-STEMI -Continue aspirin and heparin drip.  On high intensity statin.  On beta-blocker.  N.p.o. for left heart catheterization today.  Risk and benefits explained.  She is willing to proceed. -A1c 7.0.  TSH normal.  Echo is pending.  Informed Consent   Shared Decision Making/Informed Consent The risks [stroke (1 in 1000), death (1 in 1000), kidney failure [usually temporary] (1 in 500), bleeding (1 in 200), allergic reaction [possibly serious] (1 in 200)], benefits (diagnostic support and management of coronary artery disease) and alternatives of a cardiac catheterization were discussed in detail with Kerry Compton and she is willing to proceed.     # HTN -Continue home medications including metoprolol as above.  # Diabetes -Per hospital medicine.       For questions or updates, please contact [facility] Please consult [email or web address] for contact info under        Signed, Kerry Spore T. Flora Lipps, MD, Kerry Compton [facility]  [facility]  09/07/2023 7:49 AM

## 2023-09-07 NOTE — Plan of Care (Signed)
  Problem: Activity: Goal: Ability to return to baseline activity level will improve Outcome: Progressing   Problem: Cardiovascular: Goal: Ability to achieve and maintain adequate cardiovascular perfusion will improve Outcome: Progressing Goal: Vascular access site(s) Level 0-1 will be maintained Outcome: Progressing   Problem: Health Behavior/Discharge Planning: Goal: Ability to safely manage health-related needs after discharge will improve Outcome: Progressing   Problem: Education: Goal: Understanding of cardiac disease, CV risk reduction, and recovery process will improve Outcome: Progressing   Problem: Coping: Goal: Ability to adjust to condition or change in health will improve Outcome: Progressing   Problem: Fluid Volume: Goal: Ability to maintain a balanced intake and output will improve Outcome: Progressing   Problem: Health Behavior/Discharge Planning: Goal: Ability to manage health-related needs will improve Outcome: Progressing   Problem: Metabolic: Goal: Ability to maintain appropriate glucose levels will improve Outcome: Progressing   Problem: Nutritional: Goal: Maintenance of adequate nutrition will improve Outcome: Progressing Goal: Progress toward achieving an optimal weight will improve Outcome: Progressing   Problem: Education: Goal: Knowledge of General Education information will improve Description: Including pain rating scale, medication(s)/side effects and non-pharmacologic comfort measures Outcome: Progressing   Problem: Health Behavior/Discharge Planning: Goal: Ability to manage health-related needs will improve Outcome: Progressing   Problem: Clinical Measurements: Goal: Ability to maintain clinical measurements within normal limits will improve Outcome: Progressing Goal: Will remain free from infection Outcome: Progressing Goal: Diagnostic test results will improve Outcome: Progressing Goal: Respiratory complications will  improve Outcome: Progressing Goal: Cardiovascular complication will be avoided Outcome: Progressing   Problem: Activity: Goal: Risk for activity intolerance will decrease Outcome: Progressing   Problem: Nutrition: Goal: Adequate nutrition will be maintained Outcome: Progressing   Problem: Pain Managment: Goal: General experience of comfort will improve Outcome: Progressing   Problem: Safety: Goal: Ability to remain free from injury will improve Outcome: Progressing

## 2023-09-07 NOTE — Consult Note (Cosign Needed)
[address]       [place] [address]             [phone]        Kerry Compton [facility] Medical Record #[id number] Date of Birth: Aug 03, 1956  Referring: No ref. provider found Primary Care: Laurann Montana, MD Primary Cardiologist:Hartwell Cleophus Molt, MD  Chief Complaint: Chest pain/non-STEMI  History of Present Illness:    The patient is a 67 year old female we are asked to see in CT surgical consultation for consideration of coronary artery surgical revascularization.  She has known cardiac risk factors of insulin-dependent diabetes, hypertension and hyperlipidemia.  She presented to the emergency department with chest pain.  The patient said she had been doing well up until a couple days ago when she developed acute onset of substernal chest discomfort and shortness of breath while walking up an incline.  The symptoms resolved at rest but did recur.  She has since continued to experience exertional chest discomfort which resolved with rest.  She presented to the emergency department yesterday and has ruled in for non-STEMI.  EKG showed normal sinus rhythm with anteroseptal infarct and mild isolated T wave inversions in lead aVL.  High-sensitivity troponin I is being trended and so far has peaked at 111.  He has had history of a negative exercise treadmill test in June 2021.  Has not been seen by cardiology since that study.  She was admitted and cardiology consultation was obtained.  She was placed on heparin.  Felt to require cardiac catheterization which was done today which revealed severe two-vessel obstructive coronary artery disease with an occluded LAD to mid LAD lesion.  A high-grade circumflex lesion of 90% was noted.  There was only minor nonobstructive RCA disease.  PCI was attempted but they were unable to cross the obstructed LAD lesion.  Moderate LV dysfunction with an EF estimated at 35 to 40% was noted.  Additionally there was moderately elevated  LVEDP at 28 mmHg..    Current Activity/ Functional Status: Patient is independent with mobility/ambulation, transfers, ADL's, IADL's.   Zubrod Score: At the time of surgery this patient's most appropriate activity status/level should be described as: []     0    Normal activity, no symptoms []     1    Restricted in physical strenuous activity but ambulatory, able to do out light work [x]     2    Ambulatory and capable of self care, unable to do work activities, up and about                 more than 50%  Of the time                            []     3    Only limited self care, in bed greater than 50% of waking hours []     4    Completely disabled, no self care, confined to bed or chair []     5    Moribund  Past Medical History:  Diagnosis Date   Allergy    Asthma    Diabetes mellitus    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, no current liver problems   Hyperlipemia    Hypertension     Past Surgical History:  Procedure Laterality Date   COLONOSCOPY WITH PROPOFOL N/A 06/18/2014   Procedure: COLONOSCOPY WITH PROPOFOL;  Surgeon: Daphine Deutscher  Adin Hector, MD;  Location: [facility];  Service: Endoscopy;  Laterality: N/A;   ROTATOR CUFF REPAIR Right 10 yrs ago   SEPTOPLASTY     TUBAL LIGATION    Bilateral cataract surgery  Social History   Tobacco Use  Smoking Status Never  Smokeless Tobacco Never    Social History   Substance and Sexual Activity  Alcohol Use No   Alcohol/week: 0.0 standard drinks of alcohol     Allergies  Allergen Reactions   Azithromycin Diarrhea and Nausea And Vomiting   Indocin [Indomethacin] Other (See Comments)    Light headed and loopy     Current Facility-Administered Medications  Medication Dose Route Frequency Provider Last Rate Last Admin   0.9 %  sodium chloride infusion  250 mL Intravenous PRN [place], Peter M, MD       acetaminophen (TYLENOL) tablet 650 mg  650 mg Oral Q4H PRN [place], Peter M, MD   650 mg at 09/06/23 2330   amLODipine  (NORVASC) tablet 2.5 mg  2.5 mg Oral Daily [place], Peter M, MD   2.5 mg at 09/07/23 0102   aspirin EC tablet 81 mg  81 mg Oral Daily [place], Peter M, MD   81 mg at 09/07/23 7253   atorvastatin (LIPITOR) tablet 80 mg  80 mg Oral Daily [place], Peter M, MD   80 mg at 09/07/23 6644   hydrALAZINE (APRESOLINE) injection 10 mg  10 mg Intravenous Q20 Min PRN [place], Peter M, MD       insulin aspart (novoLOG) injection 0-9 Units  0-9 Units Subcutaneous Q4H [place], Peter M, MD   2 Units at 09/07/23 1201   insulin aspart (novoLOG) injection 4 Units  4 Units Subcutaneous TID WC [place], Eric J, DO       insulin glargine-yfgn [facility]) injection 20 Units  20 Units Subcutaneous QHS [place], Eric J, DO       irbesartan (AVAPRO) tablet 300 mg  300 mg Oral Daily [place], Peter M, MD   300 mg at 09/07/23 0347   metoprolol tartrate (LOPRESSOR) tablet 25 mg  25 mg Oral BID [place], Peter M, MD   25 mg at 09/07/23 0918   nitroGLYCERIN (NITROSTAT) SL tablet 0.4 mg  0.4 mg Sublingual Q5 Min x 3 PRN [place], Peter M, MD       ondansetron [facility]) injection 4 mg  4 mg Intravenous Q6H PRN [place], Peter M, MD       sodium chloride flush (NS) 0.9 % injection 3 mL  3 mL Intravenous Q12H [place], Peter M, MD       sodium chloride flush (NS) 0.9 % injection 3 mL  3 mL Intravenous PRN [place], Peter M, MD        Medications Prior to Admission  Medication Sig Dispense Refill Last Dose   amLODipine (NORVASC) 2.5 MG tablet Take 2.5 mg by mouth daily.   09/06/2023   atorvastatin (LIPITOR) 80 MG tablet Take 80 mg by mouth daily.     09/06/2023   beclomethasone (QVAR REDIHALER) 40 MCG/ACT inhaler Inhale 1 puff into the lungs daily.   09/06/2023   Cholecalciferol (VITAMIN D3) 50 MCG (2000 UT) capsule Take 2,000 Units by mouth daily.   09/06/2023   Continuous Glucose Sensor (GUARDIAN 4 GLUCOSE SENSOR) MISC Place 1 Application onto the skin See admin instructions. Every 6 days   09/03/2023   fexofenadine (ALLEGRA) 180 MG tablet Take 180 mg by  mouth daily.   09/06/2023   fluticasone (FLONASE ALLERGY RELIEF)  50 MCG/ACT nasal spray Place 1 spray into both nostrils daily.   09/06/2023   HUMALOG 100 UNIT/ML injection Inject 77 Units into the skin once.   09/05/2023   Insulin Human (INSULIN PUMP) 100 unit/ml SOLN Inject into the skin continuous. Humalog insulin pump basal rate 1200 am to 300 am = 1.0 600 am to 600 pm = 0.90      Insulin Infusion Pump Supplies (MINIMED MIO ADVANCE INFUSE SET) MISC 1 Application by Intra-arterial Infusion route See admin instructions. Every three days   09/03/2023   Multiple Vitamin (MULTIVITAMIN WITH MINERALS) TABS tablet Take 1 tablet by mouth daily.   09/06/2023   omeprazole (PRILOSEC) 20 MG capsule 1 capsule 30 minutes before morning meal   09/06/2023   oxybutynin (DITROPAN-XL) 10 MG 24 hr tablet Take 10 mg by mouth daily.   09/06/2023   valsartan (DIOVAN) 320 MG tablet Take 320 mg by mouth daily.   09/06/2023   albuterol (PROVENTIL HFA;VENTOLIN HFA) 108 (90 BASE) MCG/ACT inhaler Inhale into the lungs every 6 (six) hours as needed for wheezing or shortness of breath. (Patient not taking: Reported on 09/06/2023)   Not Taking   Cyanocobalamin (VITAMIN B12) 1000 MCG TBCR 1 tablet (Patient not taking: Reported on 09/06/2023)   Not Taking   fluticasone (FLOVENT HFA) 44 MCG/ACT inhaler Inhale 1 puff into the lungs 2 (two) times daily. (Patient not taking: Reported on 09/06/2023)   Not Taking   insulin glargine (LANTUS) 100 UNIT/ML injection Inject 15 Units into the skin daily as needed (if bs is too high).  (Patient not taking: Reported on 09/06/2023)   Not Taking   meclizine (ANTIVERT) 25 MG tablet Take 12.5-25 mg by mouth every 8 (eight) hours as needed. (Patient not taking: Reported on 07/07/2022)   Not Taking    Family History  Problem Relation Age of Onset   Diabetes Mother    Asthma Mother    Arthritis Mother    Diabetes Brother    Diabetes Maternal Grandmother    Diabetes Brother    Hypertension Brother     Hyperlipidemia Brother      Review of Systems:   Review of Systems  Constitutional:  Positive for malaise/fatigue.  HENT:  Positive for congestion.   Respiratory:  Positive for cough, sputum production and shortness of breath.   Cardiovascular:  Positive for chest pain. Negative for palpitations, orthopnea, claudication, leg swelling and PND.  Gastrointestinal:  Positive for heartburn.  Genitourinary:  Positive for frequency.  Musculoskeletal:  Positive for joint pain.  Skin:  Negative for rash.  Neurological: Negative.   Endo/Heme/Allergies:  Positive for environmental allergies. Bruises/bleeds easily.  Psychiatric/Behavioral:  Positive for memory loss.              Physical Exam: BP (!) 147/76 (BP Location: Left Arm)   Pulse 75   Temp 98 F (36.7 C) (Oral)   Resp 10   Ht 5\' 4"  (1.626 m)   Wt 93.5 kg   SpO2 91%   BMI 35.38 kg/m    General appearance: alert, cooperative, and no distress Head: Normocephalic, without obvious abnormality, atraumatic Neck: no adenopathy, no carotid bruit, no JVD, supple, symmetrical, trachea midline, and thyroid not enlarged, symmetric, no tenderness/mass/nodules Lymph nodes: Cervical, supraclavicular, and axillary nodes normal. Resp: clear to auscultation bilaterally Back: symmetric, no curvature. ROM normal. No CVA tenderness. Cardio: regular rate and rhythm, S1, S2 normal, no murmur, click, rub or gallop GI: soft, non-tender; bowel sounds normal; no masses,  no  organomegaly Extremities: No edema, peripheral pulses intact Neurologic: Grossly normal  Diagnostic Studies & Laboratory data:     Recent Radiology Findings:   CARDIAC CATHETERIZATION  Result Date: 09/07/2023   Prox RCA to Mid RCA lesion is 30% stenosed.   Ost LAD lesion is 55% stenosed.   Prox LAD to Mid LAD lesion is 100% stenosed.   Prox Cx lesion is 90% stenosed.   Post intervention, there is a 100% residual stenosis.   There is moderate left ventricular systolic  dysfunction.   LV end diastolic pressure is moderately elevated.   The left ventricular ejection fraction is 35-45% by visual estimate. Severe 2 vessel obstructive CAD. Occluded proximal LAD with collaterals. High grade proximal LCx Moderate LV dysfunction EF estimated at 35-40% Moderately elevated LVEDP 28 mm Hg Unsuccessful PCI of the LAD due to inability to cross lesion with a wire Plan: will consult CT surgery to consider CABG. I suspect she does have viability in LAD territory given low level troponin leak and lack of significant ST changes. No P2Y12 inhibitor administered during case.   DG Chest 2 View  Result Date: 09/06/2023 CLINICAL DATA:  Chest pain. EXAM: CHEST - 2 VIEW COMPARISON:  February 08, 2018. FINDINGS: The heart size and mediastinal contours are within normal limits. Both lungs are clear. The visualized skeletal structures are unremarkable. IMPRESSION: No active cardiopulmonary disease. Electronically Signed   By: Lupita Raider M.D.   On: 09/06/2023 17:02     I have independently reviewed the above radiologic studies and discussed with the patient   Recent Lab Findings: Lab Results  Component Value Date   WBC 9.6 09/07/2023   HGB 12.3 09/07/2023   HCT 37.5 09/07/2023   PLT 165 09/07/2023   GLUCOSE 271 (H) 09/07/2023   CHOL 137 09/07/2023   TRIG 50 09/07/2023   HDL 50 09/07/2023   LDLCALC 77 09/07/2023   ALT 26 04/01/2016   AST 23 04/01/2016   NA 136 09/07/2023   K 3.8 09/07/2023   CL 104 09/07/2023   CREATININE 0.75 09/07/2023   BUN 14 09/07/2023   CO2 24 09/07/2023   TSH 0.967 09/07/2023   HGBA1C 7.0 (H) 09/07/2023      Assessment / Plan: Severe two-vessel coronary artery disease in the setting of non-STEMI. Diabetes mellitus-insulin-dependent, she uses a insulin pump Remote history hepatitis A Hyperlipidemia Hypertension Chronic sinusitis/environmental allergies History of asthma  P: Echocardiogram is pending but being done now.  The surgeon will  evaluate the patient's studies and make recommendations.  She does appear to be a candidate for CABG.     I  spent 30 minutes counseling the patient face to face.  Rowe Clack, PA-C  09/07/2023 2:57 PM

## 2023-09-07 NOTE — H&P (View-Only) (Signed)
[address]       [place] [address]             [phone]        Kerry Compton [facility] Medical Record #[id number] Date of Birth: [birth year]  Referring: No ref. provider found Primary Care: Laurann Montana, MD Primary Cardiologist: Cleophus Molt, MD  Chief Complaint: Chest pain/non-STEMI  History of Present Illness:    The patient is a 67 year old female we are asked to see in CT surgical consultation for consideration of coronary artery surgical revascularization.  She has known cardiac risk factors of insulin-dependent diabetes, hypertension and hyperlipidemia.  She presented to the emergency department with chest pain.  The patient said she had been doing well up until a couple days ago when she developed acute onset of substernal chest discomfort and shortness of breath while walking up an incline.  The symptoms resolved at rest but did recur.  She has since continued to experience exertional chest discomfort which resolved with rest.  She presented to the emergency department yesterday and has ruled in for non-STEMI.  EKG showed normal sinus rhythm with anteroseptal infarct and mild isolated T wave inversions in lead aVL.  High-sensitivity troponin I is being trended and so far has peaked at 111.  He has had history of a negative exercise treadmill test in June 2021.  Has not been seen by cardiology since that study.  She was admitted and cardiology consultation was obtained.  She was placed on heparin.  Felt to require cardiac catheterization which was done today which revealed severe two-vessel obstructive coronary artery disease with an occluded LAD to mid LAD lesion.  A high-grade circumflex lesion of 90% was noted.  There was only minor nonobstructive RCA disease.  PCI was attempted but they were unable to cross the obstructed LAD lesion.  Moderate LV dysfunction with an EF estimated at 35 to 40% was noted.  Additionally there was moderately elevated  LVEDP at 28 mmHg..    Current Activity/ Functional Status: Patient is independent with mobility/ambulation, transfers, ADL's, IADL's.   Zubrod Score: At the time of surgery this patient's most appropriate activity status/level should be described as: []     0    Normal activity, no symptoms []     1    Restricted in physical strenuous activity but ambulatory, able to do out light work [x]     2    Ambulatory and capable of self care, unable to do work activities, up and about                 more than 50%  Of the time                            []     3    Only limited self care, in bed greater than 50% of waking hours []     4    Completely disabled, no self care, confined to bed or chair []     5    Moribund  Past Medical History:  Diagnosis Date   Allergy    Asthma    Diabetes mellitus    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, no current liver problems   Hyperlipemia    Hypertension     Past Surgical History:  Procedure Laterality Date   COLONOSCOPY WITH PROPOFOL N/A 06/18/2014   Procedure: COLONOSCOPY WITH PROPOFOL;  Surgeon: Daphine Deutscher  Adin Hector, MD;  Location: [facility];  Service: Endoscopy;  Laterality: N/A;   ROTATOR CUFF REPAIR Right 10 yrs ago   SEPTOPLASTY     TUBAL LIGATION    Bilateral cataract surgery  Social History   Tobacco Use  Smoking Status Never  Smokeless Tobacco Never    Social History   Substance and Sexual Activity  Alcohol Use No   Alcohol/week: 0.0 standard drinks of alcohol     Allergies  Allergen Reactions   Azithromycin Diarrhea and Nausea And Vomiting   Indocin [Indomethacin] Other (See Comments)    Light headed and loopy     Current Facility-Administered Medications  Medication Dose Route Frequency Provider Last Rate Last Admin   0.9 %  sodium chloride infusion  250 mL Intravenous PRN [place], Peter M, MD       acetaminophen (TYLENOL) tablet 650 mg  650 mg Oral Q4H PRN [place], Peter M, MD   650 mg at 09/06/23 2330   amLODipine  (NORVASC) tablet 2.5 mg  2.5 mg Oral Daily [place], Peter M, MD   2.5 mg at 09/07/23 9604   aspirin EC tablet 81 mg  81 mg Oral Daily [place], Peter M, MD   81 mg at 09/07/23 5409   atorvastatin (LIPITOR) tablet 80 mg  80 mg Oral Daily [place], Peter M, MD   80 mg at 09/07/23 8119   hydrALAZINE (APRESOLINE) injection 10 mg  10 mg Intravenous Q20 Min PRN [place], Peter M, MD       insulin aspart (novoLOG) injection 0-9 Units  0-9 Units Subcutaneous Q4H [place], Peter M, MD   2 Units at 09/07/23 1201   insulin aspart (novoLOG) injection 4 Units  4 Units Subcutaneous TID WC [place], Eric J, DO       insulin glargine-yfgn [facility]) injection 20 Units  20 Units Subcutaneous QHS [place], Eric J, DO       irbesartan (AVAPRO) tablet 300 mg  300 mg Oral Daily [place], Peter M, MD   300 mg at 09/07/23 1478   metoprolol tartrate (LOPRESSOR) tablet 25 mg  25 mg Oral BID [place], Peter M, MD   25 mg at 09/07/23 0918   nitroGLYCERIN (NITROSTAT) SL tablet 0.4 mg  0.4 mg Sublingual Q5 Min x 3 PRN [place], Peter M, MD       ondansetron [facility]) injection 4 mg  4 mg Intravenous Q6H PRN [place], Peter M, MD       sodium chloride flush (NS) 0.9 % injection 3 mL  3 mL Intravenous Q12H [place], Peter M, MD       sodium chloride flush (NS) 0.9 % injection 3 mL  3 mL Intravenous PRN [place], Peter M, MD        Medications Prior to Admission  Medication Sig Dispense Refill Last Dose   amLODipine (NORVASC) 2.5 MG tablet Take 2.5 mg by mouth daily.   09/06/2023   atorvastatin (LIPITOR) 80 MG tablet Take 80 mg by mouth daily.     09/06/2023   beclomethasone (QVAR REDIHALER) 40 MCG/ACT inhaler Inhale 1 puff into the lungs daily.   09/06/2023   Cholecalciferol (VITAMIN D3) 50 MCG (2000 UT) capsule Take 2,000 Units by mouth daily.   09/06/2023   Continuous Glucose Sensor (GUARDIAN 4 GLUCOSE SENSOR) MISC Place 1 Application onto the skin See admin instructions. Every 6 days   09/03/2023   fexofenadine (ALLEGRA) 180 MG tablet Take 180 mg by  mouth daily.   09/06/2023   fluticasone (FLONASE ALLERGY RELIEF)  50 MCG/ACT nasal spray Place 1 spray into both nostrils daily.   09/06/2023   HUMALOG 100 UNIT/ML injection Inject 77 Units into the skin once.   09/05/2023   Insulin Human (INSULIN PUMP) 100 unit/ml SOLN Inject into the skin continuous. Humalog insulin pump basal rate 1200 am to 300 am = 1.0 600 am to 600 pm = 0.90      Insulin Infusion Pump Supplies (MINIMED MIO ADVANCE INFUSE SET) MISC 1 Application by Intra-arterial Infusion route See admin instructions. Every three days   09/03/2023   Multiple Vitamin (MULTIVITAMIN WITH MINERALS) TABS tablet Take 1 tablet by mouth daily.   09/06/2023   omeprazole (PRILOSEC) 20 MG capsule 1 capsule 30 minutes before morning meal   09/06/2023   oxybutynin (DITROPAN-XL) 10 MG 24 hr tablet Take 10 mg by mouth daily.   09/06/2023   valsartan (DIOVAN) 320 MG tablet Take 320 mg by mouth daily.   09/06/2023   albuterol (PROVENTIL HFA;VENTOLIN HFA) 108 (90 BASE) MCG/ACT inhaler Inhale into the lungs every 6 (six) hours as needed for wheezing or shortness of breath. (Patient not taking: Reported on 09/06/2023)   Not Taking   Cyanocobalamin (VITAMIN B12) 1000 MCG TBCR 1 tablet (Patient not taking: Reported on 09/06/2023)   Not Taking   fluticasone (FLOVENT HFA) 44 MCG/ACT inhaler Inhale 1 puff into the lungs 2 (two) times daily. (Patient not taking: Reported on 09/06/2023)   Not Taking   insulin glargine (LANTUS) 100 UNIT/ML injection Inject 15 Units into the skin daily as needed (if bs is too high).  (Patient not taking: Reported on 09/06/2023)   Not Taking   meclizine (ANTIVERT) 25 MG tablet Take 12.5-25 mg by mouth every 8 (eight) hours as needed. (Patient not taking: Reported on 07/07/2022)   Not Taking    Family History  Problem Relation Age of Onset   Diabetes Mother    Asthma Mother    Arthritis Mother    Diabetes Brother    Diabetes Maternal Grandmother    Diabetes Brother    Hypertension Brother     Hyperlipidemia Brother      Review of Systems:   Review of Systems  Constitutional:  Positive for malaise/fatigue.  HENT:  Positive for congestion.   Respiratory:  Positive for cough, sputum production and shortness of breath.   Cardiovascular:  Positive for chest pain. Negative for palpitations, orthopnea, claudication, leg swelling and PND.  Gastrointestinal:  Positive for heartburn.  Genitourinary:  Positive for frequency.  Musculoskeletal:  Positive for joint pain.  Skin:  Negative for rash.  Neurological: Negative.   Endo/Heme/Allergies:  Positive for environmental allergies. Bruises/bleeds easily.  Psychiatric/Behavioral:  Positive for memory loss.              Physical Exam: BP (!) 147/76 (BP Location: Left Arm)   Pulse 75   Temp 98 F (36.7 C) (Oral)   Resp 10   Ht 5\' 4"  (1.626 m)   Wt 93.5 kg   SpO2 91%   BMI 35.38 kg/m    General appearance: alert, cooperative, and no distress Head: Normocephalic, without obvious abnormality, atraumatic Neck: no adenopathy, no carotid bruit, no JVD, supple, symmetrical, trachea midline, and thyroid not enlarged, symmetric, no tenderness/mass/nodules Lymph nodes: Cervical, supraclavicular, and axillary nodes normal. Resp: clear to auscultation bilaterally Back: symmetric, no curvature. ROM normal. No CVA tenderness. Cardio: regular rate and rhythm, S1, S2 normal, no murmur, click, rub or gallop GI: soft, non-tender; bowel sounds normal; no masses,  no  organomegaly Extremities: No edema, peripheral pulses intact Neurologic: Grossly normal  Diagnostic Studies & Laboratory data:     Recent Radiology Findings:   CARDIAC CATHETERIZATION  Result Date: 09/07/2023   Prox RCA to Mid RCA lesion is 30% stenosed.   Ost LAD lesion is 55% stenosed.   Prox LAD to Mid LAD lesion is 100% stenosed.   Prox Cx lesion is 90% stenosed.   Post intervention, there is a 100% residual stenosis.   There is moderate left ventricular systolic  dysfunction.   LV end diastolic pressure is moderately elevated.   The left ventricular ejection fraction is 35-45% by visual estimate. Severe 2 vessel obstructive CAD. Occluded proximal LAD with collaterals. High grade proximal LCx Moderate LV dysfunction EF estimated at 35-40% Moderately elevated LVEDP 28 mm Hg Unsuccessful PCI of the LAD due to inability to cross lesion with a wire Plan: will consult CT surgery to consider CABG. I suspect she does have viability in LAD territory given low level troponin leak and lack of significant ST changes. No P2Y12 inhibitor administered during case.   DG Chest 2 View  Result Date: 09/06/2023 CLINICAL DATA:  Chest pain. EXAM: CHEST - 2 VIEW COMPARISON:  February 08, 2018. FINDINGS: The heart size and mediastinal contours are within normal limits. Both lungs are clear. The visualized skeletal structures are unremarkable. IMPRESSION: No active cardiopulmonary disease. Electronically Signed   By: Lupita Raider M.D.   On: 09/06/2023 17:02     I have independently reviewed the above radiologic studies and discussed with the patient   Recent Lab Findings: Lab Results  Component Value Date   WBC 9.6 09/07/2023   HGB 12.3 09/07/2023   HCT 37.5 09/07/2023   PLT 165 09/07/2023   GLUCOSE 271 (H) 09/07/2023   CHOL 137 09/07/2023   TRIG 50 09/07/2023   HDL 50 09/07/2023   LDLCALC 77 09/07/2023   ALT 26 04/01/2016   AST 23 04/01/2016   NA 136 09/07/2023   K 3.8 09/07/2023   CL 104 09/07/2023   CREATININE 0.75 09/07/2023   BUN 14 09/07/2023   CO2 24 09/07/2023   TSH 0.967 09/07/2023   HGBA1C 7.0 (H) 09/07/2023      Assessment / Plan: Severe two-vessel coronary artery disease in the setting of non-STEMI. Diabetes mellitus-insulin-dependent, she uses a insulin pump Remote history hepatitis A Hyperlipidemia Hypertension Chronic sinusitis/environmental allergies History of asthma  P: Echocardiogram is pending but being done now.  The surgeon will  evaluate the patient's studies and make recommendations.  She does appear to be a candidate for CABG.     I  spent 30 minutes counseling the patient face to face.  Rowe Clack, PA-C  09/07/2023 2:57 PM    Chart reviewed, patient examined, agree with above. This 67 year old woman with hypertension and hyperlipidemia as well as insulin-dependent diabetes reports being in her usual state of health until last weekend when she had an episode of chest discomfort and shortness of breath walking up an incline.  She said she had never had that before.  Then on Monday she had recurrent chest discomfort and shortness of breath while walking upstairs going to jury duty.  She said she felt like she may not make it.  On presentation her troponin was 103 and 111.  ECG showed anterior septal infarct and some mild T wave inversions in aVL.  This x-ray was unremarkable.  Cardiac catheterization yesterday showed severe two-vessel coronary disease with an occluded proximal LAD with  slow flow into the mid to distal vessel by collaterals.  Left circumflex had 90% proximal stenosis of the large vessel.  The RCA had mild nonobstructive disease.  Left ventricular ejection fraction was estimated at 35 to 40% with an LVEDP of 28.  Attempt to cross the LAD occlusion was unsuccessful.  She subsequently had a 2D echo yesterday which showed an ejection fraction of 50 to 55% with hypokinesis of the anterior wall, septum, and mid to distal inferior wall.  No significant valvular abnormality.  She has had no further chest discomfort.  I agree that coronary bypass graft surgery to the LAD and left circumflex systems is the best treatment to prevent further ischemia and infarction and resolve her symptoms. I discussed the operative procedure with the patient and family including alternatives, benefits and risks; including but not limited to bleeding, blood transfusion, infection, stroke, myocardial infarction, graft failure, heart block  requiring a permanent pacemaker, organ dysfunction, and death.  Kerry Compton understands and agrees to proceed.  We will schedule surgery for tomorrow morning.

## 2023-09-07 NOTE — H&P (View-Only) (Signed)
Cardiology Progress Note  Patient ID: Kerry Compton MRN: [id number] DOB: 27-Jul-1956 Date of Encounter: 09/07/2023  Primary Cardiologist: Reatha Harps, MD  Subjective   Chief Complaint: None.   HPI: No further chest pain.  CV exam unremarkable.  ROS:  All other ROS reviewed and negative. Pertinent positives noted in the HPI.     Inpatient Medications  Scheduled Meds:  amLODipine  2.5 mg Oral Daily   aspirin EC  81 mg Oral Daily   atorvastatin  80 mg Oral Daily   insulin aspart  0-6 Units Subcutaneous Q4H   insulin glargine-yfgn  10 Units Subcutaneous QHS   irbesartan  300 mg Oral Daily   metoprolol tartrate  25 mg Oral BID   Continuous Infusions:  sodium chloride 1 mL/kg/hr (09/07/23 0537)   heparin 1,100 Units/hr (09/07/23 0436)   PRN Meds: acetaminophen, nitroGLYCERIN, ondansetron (ZOFRAN) IV   Vital Signs   Vitals:   09/06/23 2154 09/06/23 2300 09/07/23 0455 09/07/23 0728  BP: (!) 162/87 (!) 148/78 (!) 148/77 139/67  Pulse: (!) 113 (!) 106 83 68  Resp:  16 20 13   Temp:  98.1 F (36.7 C) 97.7 F (36.5 C) 97.8 F (36.6 C)  TempSrc:  Oral Oral Oral  SpO2:  93% 96% 93%  Weight:  93.5 kg    Height:  5\' 4"  (1.626 m)      Intake/Output Summary (Last 24 hours) at 09/07/2023 0749 Last data filed at 09/07/2023 0436 Gross per 24 hour  Intake 370.89 ml  Output --  Net 370.89 ml      09/06/2023   11:00 PM 09/06/2023    3:04 PM 06/15/2020    2:43 PM  Last 3 Weights  Weight (lbs) 206 lb 2.1 oz 214 lb 15.2 oz 215 lb  Weight (kg) 93.5 kg 97.5 kg 97.523 kg      Telemetry  Overnight telemetry shows sinus rhythm 70 to 80 bpm, which I personally reviewed.   ECG  The most recent ECG shows normal sinus rhythm, old anteroseptal infarct, which I personally reviewed.   Physical Exam   Vitals:   09/06/23 2154 09/06/23 2300 09/07/23 0455 09/07/23 0728  BP: (!) 162/87 (!) 148/78 (!) 148/77 139/67  Pulse: (!) 113 (!) 106 83 68  Resp:  16 20 13   Temp:  98.1 F  (36.7 C) 97.7 F (36.5 C) 97.8 F (36.6 C)  TempSrc:  Oral Oral Oral  SpO2:  93% 96% 93%  Weight:  93.5 kg    Height:  5\' 4"  (1.626 m)      Intake/Output Summary (Last 24 hours) at 09/07/2023 0749 Last data filed at 09/07/2023 0436 Gross per 24 hour  Intake 370.89 ml  Output --  Net 370.89 ml       09/06/2023   11:00 PM 09/06/2023    3:04 PM 06/15/2020    2:43 PM  Last 3 Weights  Weight (lbs) 206 lb 2.1 oz 214 lb 15.2 oz 215 lb  Weight (kg) 93.5 kg 97.5 kg 97.523 kg    Body mass index is 35.38 kg/m.  General: Well nourished, well developed, in no acute distress Head: Atraumatic, normal size  Eyes: PEERLA, EOMI  Neck: Supple, no JVD Endocrine: No thryomegaly Cardiac: Normal S1, S2; RRR; no murmurs, rubs, or gallops Lungs: Clear to auscultation bilaterally, no wheezing, rhonchi or rales  Abd: Soft, nontender, no hepatomegaly  Ext: No edema, pulses 2+ Musculoskeletal: No deformities, BUE and BLE strength normal and equal Skin: Warm and dry,  no rashes   Neuro: Alert and oriented to person, place, time, and situation, CNII-XII grossly intact, no focal deficits  Psych: Normal mood and affect   Labs  High Sensitivity Troponin:   Recent Labs  Lab 09/06/23 1526 09/06/23 1710  TROPONINIHS 103* 111*     Cardiac EnzymesNo results for input(s): "TROPONINI" in the last 168 hours. No results for input(s): "TROPIPOC" in the last 168 hours.  Chemistry Recent Labs  Lab 09/06/23 1526 09/07/23 0202  NA 141 136  K 4.5 3.8  CL 106 104  CO2 25 24  GLUCOSE 96 271*  BUN 11 14  CREATININE 0.66 0.75  CALCIUM 9.3 8.5*  GFRNONAA >60 >60  ANIONGAP 10 8    Hematology Recent Labs  Lab 09/06/23 1526 09/07/23 0202  WBC 7.1 9.6  RBC 5.01 4.32  HGB 14.8 12.3  HCT 44.7 37.5  MCV 89.2 86.8  MCH 29.5 28.5  MCHC 33.1 32.8  RDW 12.6 12.6  PLT 251 165   BNPNo results for input(s): "BNP", "PROBNP" in the last 168 hours.  DDimer No results for input(s): "DDIMER" in the last 168 hours.    Radiology  DG Chest 2 View  Result Date: 09/06/2023 CLINICAL DATA:  Chest pain. EXAM: CHEST - 2 VIEW COMPARISON:  February 08, 2018. FINDINGS: The heart size and mediastinal contours are within normal limits. Both lungs are clear. The visualized skeletal structures are unremarkable. IMPRESSION: No active cardiopulmonary disease. Electronically Signed   By: Lupita Raider M.D.   On: 09/06/2023 17:02    Cardiac Studies  Echo and left heart cath pending  Patient Profile  Kerry Compton is a 67 y.o. female with diabetes, hypertension, hyperlipidemia, asthma admitted on 09/06/2023 for non-STEMI.  Assessment & Plan   # Non-STEMI -Continue aspirin and heparin drip.  On high intensity statin.  On beta-blocker.  N.p.o. for left heart catheterization today.  Risk and benefits explained.  She is willing to proceed. -A1c 7.0.  TSH normal.  Echo is pending.  Informed Consent   Shared Decision Making/Informed Consent The risks [stroke (1 in 1000), death (1 in 1000), kidney failure [usually temporary] (1 in 500), bleeding (1 in 200), allergic reaction [possibly serious] (1 in 200)], benefits (diagnostic support and management of coronary artery disease) and alternatives of a cardiac catheterization were discussed in detail with Kerry Compton and she is willing to proceed.     # HTN -Continue home medications including metoprolol as above.  # Diabetes -Per hospital medicine.       For questions or updates, please contact [facility] Please consult [email or web address] for contact info under        Signed, Gerri Spore T. Flora Lipps, MD, [facility] [place]  [facility]  09/07/2023 7:49 AM

## 2023-09-07 NOTE — Progress Notes (Addendum)
PROGRESS NOTE    Kerry Compton  HQI:[id number] DOB: [birth year] DOA: 09/06/2023 PCP: Laurann Montana, MD    Brief Narrative:   Kerry Compton is a 67 y.o. female with past medical history significant for insulin-dependent diabetes mellitus, HTN, hyperlipidemia who presented to [facility] ED on 9/24 from walk-in clinic for chest pain and abnormal EKG.  Onset of symptoms 09/03/2023 with acute onset substernal chest pain associate with shortness of breath, worsened while walking up an incline.  Pain resolved with rest but reoccurred once again day prior to admission while ascending stairs.  Continue to experience exertional chest discomfort that resolves with rest. Denies leg swelling, no fevers, no chills, no cough.  In the ED, temperature 98.2 F, HR 88, RR 18, BP 182/83, SpO2 99% on room air.  WBC 7.1, hemoglobin 14.8, platelet count 251.  Sodium 141, potassium 4.5, chloride 106, CO2 25, glucose 96, BUN 11, creatinine 0.66.  High sensitivity troponin 103 followed by 111.  COVID PCR negative.  Chest x-ray with no active cardiopulmonary disease process.  EKG with normal sinus rhythm, no ST elevation; but Q waves noted anterior leads.  Cardiology was consulted.  Patient was started on heparin drip.  TRH consulted for admission for further evaluation management of NSTEMI.  Assessment & Plan:   NSTEMI Patient presenting with chest pain associated with exertion and shortness of breath.  Patient is afebrile without leukocytosis.  Chest x-ray with no acute cardiopulmonary disease process.  High sensitive troponin elevated 103>111.  Hemoglobin A1c 7.0.  LDL 77.  TSH 0.967, within normal limits. -- Cardiology following, appreciate assistance -- TTE: Pending -- Lipoprotein a: Pending -- Metoprolol 25 mg p.o. twice daily -- Atorvastatin 80 mg p.o. daily -- Heparin drip -- N.p.o., pending left heart catheterization today  Essential hypertension -- Metoprolol tartrate 25 mg p.o. twice  daily -- Irbesartan 3 mg p.o. daily  Hyperlipidemia Lipid panel with total cholesterol 137, HDL 50, LDL 77, triglycerides 50. -- Atorvastatin 80 mg p.o. daily  Type 2 diabetes mellitus On insulin pump at baseline.  Hemoglobin A1c 7.0, well-controlled. -- Semglee 15u [place] daily -- SSI for coverage -- CBGs q4h while NPO  Obese Body mass index is 35.38 kg/m.  Complicates all facets of care   DVT prophylaxis: Heparin drip    Code Status: Full Code Family Communication: No family present at bedside this morning  Disposition Plan:  Level of care: Telemetry Cardiac Status is: Inpatient Remains inpatient appropriate because: Pending left heart catheterization today    Consultants:  Cardiology  Procedures:  TTE: Pending  Antimicrobials:  None   Subjective: Patient seen examined bedside, resting calmly.  Lying in bed.  Remains on heparin drip.  Awaiting for left heart catheterization later today.  Currently denies chest pain.  No other specific complaints or concerns at this time.  Denies headache, no visual changes, no chest pain, no palpitations, no shortness of breath, no abdominal pain, no fever/chills/night sweats, no nausea cefonicid diarrhea, no focal weakness, no fatigue, no paresthesias.  No acute events overnight per nursing staff.  Objective: Vitals:   09/06/23 2300 09/07/23 0455 09/07/23 0728 09/07/23 0918  BP: (!) 148/78 (!) 148/77 139/67 (!) 162/78  Pulse: (!) 106 83 68 81  Resp: 16 20 13    Temp: 98.1 F (36.7 C) 97.7 F (36.5 C) 97.8 F (36.6 C)   TempSrc: Oral Oral Oral   SpO2: 93% 96% 93%   Weight: 93.5 kg     Height: 5\' 4"  (  1.626 m)       Intake/Output Summary (Last 24 hours) at 09/07/2023 0933 Last data filed at 09/07/2023 0436 Gross per 24 hour  Intake 370.89 ml  Output --  Net 370.89 ml   Filed Weights   09/06/23 1504 09/06/23 2300  Weight: 97.5 kg 93.5 kg    Examination:  Physical Exam: GEN: NAD, alert and oriented x 3, obese HEENT:  NCAT, PERRL, EOMI, sclera clear, MMM PULM: CTAB w/o wheezes/crackles, normal respiratory effort, on room air CV: RRR w/o M/G/R GI: abd soft, NTND, NABS, no R/G/M MSK: no peripheral edema, muscle strength globally intact 5/5 bilateral upper/lower extremities NEURO: CN II-XII intact, no focal deficits, sensation to light touch intact PSYCH: normal mood/affect Integumentary: dry/intact, no rashes or wounds    Data Reviewed: I have personally reviewed following labs and imaging studies  CBC: Recent Labs  Lab 09/06/23 1526 09/07/23 0202  WBC 7.1 9.6  HGB 14.8 12.3  HCT 44.7 37.5  MCV 89.2 86.8  PLT 251 165   Basic Metabolic Panel: Recent Labs  Lab 09/06/23 1526 09/07/23 0202  NA 141 136  K 4.5 3.8  CL 106 104  CO2 25 24  GLUCOSE 96 271*  BUN 11 14  CREATININE 0.66 0.75  CALCIUM 9.3 8.5*   GFR: Estimated Creatinine Clearance: 76.7 mL/min (by C-G formula based on SCr of 0.75 mg/dL). Liver Function Tests: No results for input(s): "AST", "ALT", "ALKPHOS", "BILITOT", "PROT", "ALBUMIN" in the last 168 hours. No results for input(s): "LIPASE", "AMYLASE" in the last 168 hours. No results for input(s): "AMMONIA" in the last 168 hours. Coagulation Profile: No results for input(s): "INR", "PROTIME" in the last 168 hours. Cardiac Enzymes: No results for input(s): "CKTOTAL", "CKMB", "CKMBINDEX", "TROPONINI" in the last 168 hours. BNP (last 3 results) No results for input(s): "PROBNP" in the last 8760 hours. HbA1C: Recent Labs    09/07/23 0202  HGBA1C 7.0*   CBG: Recent Labs  Lab 09/06/23 2136 09/06/23 2308 09/07/23 0459 09/07/23 0859  GLUCAP 260* 294* 293* 223*   Lipid Profile: Recent Labs    09/07/23 0205  CHOL 137  HDL 50  LDLCALC 77  TRIG 50  CHOLHDL 2.7   Thyroid Function Tests: Recent Labs    09/07/23 0205  TSH 0.967   Anemia Panel: No results for input(s): "VITAMINB12", "FOLATE", "FERRITIN", "TIBC", "IRON", "RETICCTPCT" in the last 72  hours. Sepsis Labs: No results for input(s): "PROCALCITON", "LATICACIDVEN" in the last 168 hours.  Recent Results (from the past 240 hour(s))  SARS Coronavirus 2 by RT PCR (hospital order, performed in [facility] hospital lab) *cepheid single result test* Anterior Nasal Swab     Status: None   Collection Time: 09/06/23  5:10 PM   Specimen: Anterior Nasal Swab  Result Value Ref Range Status   SARS Coronavirus 2 by RT PCR NEGATIVE NEGATIVE Final    Comment: Performed at [facility], [address], [place], [place] [address]  MRSA Next Gen by PCR, Nasal     Status: None   Collection Time: 09/06/23 11:20 PM   Specimen: Nasal Mucosa; Nasal Swab  Result Value Ref Range Status   MRSA by PCR Next Gen NOT DETECTED NOT DETECTED Final    Comment: (NOTE) The GeneXpert MRSA Assay (FDA approved for NASAL specimens only), is one component of a comprehensive MRSA colonization surveillance program. It is not intended to diagnose MRSA infection nor to guide or monitor treatment for MRSA infections. Test performance is not FDA approved  in patients less than 18 years old. Performed at [facility], [address] [place], [place] [address]          Radiology Studies: DG Chest 2 View  Result Date: 09/06/2023 CLINICAL DATA:  Chest pain. EXAM: CHEST - 2 VIEW COMPARISON:  February 08, 2018. FINDINGS: The heart size and mediastinal contours are within normal limits. Both lungs are clear. The visualized skeletal structures are unremarkable. IMPRESSION: No active cardiopulmonary disease. Electronically Signed   By: Lupita Raider M.D.   On: 09/06/2023 17:02        Scheduled Meds:  amLODipine  2.5 mg Oral Daily   aspirin EC  81 mg Oral Daily   atorvastatin  80 mg Oral Daily   insulin aspart  0-6 Units Subcutaneous Q4H   insulin glargine-yfgn  10 Units Subcutaneous QHS   irbesartan  300 mg Oral Daily   metoprolol tartrate  25 mg Oral BID   Continuous Infusions:  sodium chloride  1 mL/kg/hr (09/07/23 0537)   heparin 1,100 Units/hr (09/07/23 0436)     LOS: 1 day    Time spent: 52 minutes spent on chart review, discussion with nursing staff, consultants, updating family and interview/physical exam; more than 50% of that time was spent in counseling and/or coordination of care.    Alvira Philips [place], DO [facility] Available via Epic secure chat 7am-7pm After these hours, please refer to coverage provider listed on [email or web address] 09/07/2023, 9:33 AM

## 2023-09-07 NOTE — Progress Notes (Signed)
ANTICOAGULATION CONSULT NOTE  Pharmacy Consult for heparin Indication: severe 2v CAD - awaiting CABG  Allergies  Allergen Reactions   Azithromycin Diarrhea and Nausea And Vomiting   Indocin [Indomethacin] Other (See Comments)    Light headed and loopy     Patient Measurements: Height: 5\' 4"  (162.6 cm) Weight: 93.5 kg (206 lb 2.1 oz) IBW/kg (Calculated) : 54.7 Heparin Dosing Weight: 79kg  Vital Signs: Temp: 98.1 F (36.7 C) (09/25 1530) Temp Source: Oral (09/25 1530) BP: 155/78 (09/25 1530) Pulse Rate: 87 (09/25 1530)  Labs: Recent Labs    09/06/23 1526 09/06/23 1710 09/07/23 0202  HGB 14.8  --  12.3  HCT 44.7  --  37.5  PLT 251  --  165  HEPARINUNFRC  --   --  0.53  CREATININE 0.66  --  0.75  TROPONINIHS 103* 111*  --     Estimated Creatinine Clearance: 76.7 mL/min (by C-G formula based on SCr of 0.75 mg/dL).   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   Heparin to restart 2 hours post TR band removal. TR band off ~1915.   Goal of Therapy:  Heparin level 0.3-0.7 units/ml Monitor platelets by anticoagulation protocol: Yes   Plan:  At 2115, start heparin at 1100 units/hr Will f/u heparin level 8 hours post restart  Christoper Fabian, PharmD, BCPS Please see amion for complete clinical pharmacist phone list 09/07/2023 7:06 PM

## 2023-09-07 NOTE — Progress Notes (Signed)
ANTICOAGULATION CONSULT NOTE  Pharmacy Consult for heparin Indication: chest pain/ACS  Allergies  Allergen Reactions   Azithromycin Diarrhea and Nausea And Vomiting   Indocin [Indomethacin] Other (See Comments)    Light headed and loopy     Patient Measurements: Height: 5\' 4"  (162.6 cm) Weight: 93.5 kg (206 lb 2.1 oz) IBW/kg (Calculated) : 54.7 Heparin Dosing Weight: 79kg  Vital Signs: Temp: 98.1 F (36.7 C) (09/25 1135) Temp Source: Oral (09/25 1135) BP: 152/81 (09/25 1327) Pulse Rate: 0 (09/25 1332)  Labs: Recent Labs    09/06/23 1526 09/06/23 1710 09/07/23 0202  HGB 14.8  --  12.3  HCT 44.7  --  37.5  PLT 251  --  165  HEPARINUNFRC  --   --  0.53  CREATININE 0.66  --  0.75  TROPONINIHS 103* 111*  --     Estimated Creatinine Clearance: 76.7 mL/min (by C-G formula based on SCr of 0.75 mg/dL).   Medical History: Past Medical History:  Diagnosis Date   Allergy    Asthma    Diabetes mellitus    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, no current liver problems   Hyperlipemia    Hypertension     Assessment: 5 YOF presenting with CP, elevated troponin, she is not on anticoagulation PTA, CBC wnl  Pt s/p LHC w/ mvCAD, to resume IV heparin 2h after TR band removed.  Goal of Therapy:  Heparin level 0.3-0.7 units/ml Monitor platelets by anticoagulation protocol: Yes   Plan:  F/U TR band removal  Fredonia Highland, PharmD, BCPS, [facility] Clinical Pharmacist [phone] Please check AMION for all [facility] numbers 09/07/2023

## 2023-09-08 ENCOUNTER — Encounter (HOSPITAL_COMMUNITY): Payer: Self-pay | Admitting: Cardiology

## 2023-09-08 ENCOUNTER — Inpatient Hospital Stay (HOSPITAL_COMMUNITY): Payer: Medicare Other

## 2023-09-08 DIAGNOSIS — I214 Non-ST elevation (NSTEMI) myocardial infarction: Secondary | ICD-10-CM | POA: Diagnosis not present

## 2023-09-08 DIAGNOSIS — Z01810 Encounter for preprocedural cardiovascular examination: Secondary | ICD-10-CM | POA: Diagnosis not present

## 2023-09-08 LAB — COMPREHENSIVE METABOLIC PANEL
ALT: 27 U/L (ref 0–44)
AST: 25 U/L (ref 15–41)
Albumin: 3.7 g/dL (ref 3.5–5.0)
Alkaline Phosphatase: 75 U/L (ref 38–126)
Anion gap: 13 (ref 5–15)
BUN: 11 mg/dL (ref 8–23)
CO2: 24 mmol/L (ref 22–32)
Calcium: 9.3 mg/dL (ref 8.9–10.3)
Chloride: 100 mmol/L (ref 98–111)
Creatinine, Ser: 0.94 mg/dL (ref 0.44–1.00)
GFR, Estimated: >60 mL/min (ref >60)
Glucose, Bld: 227 mg/dL — ABNORMAL HIGH (ref 70–99)
Potassium: 3.9 mmol/L (ref 3.5–5.1)
Sodium: 137 mmol/L (ref 135–145)
Total Bilirubin: 0.8 mg/dL (ref 0.3–1.2)
Total Protein: 6.5 g/dL (ref 6.5–8.1)

## 2023-09-08 LAB — POCT I-STAT 7, (LYTES, BLD GAS, ICA,H+H)
Acid-Base Excess: 1 mmol/L (ref 0.0–2.0)
Bicarbonate: 25.5 mmol/L (ref 20.0–28.0)
Calcium, Ion: 1.24 mmol/L (ref 1.15–1.40)
HCT: 39 % (ref 36.0–46.0)
Hemoglobin: 13.3 g/dL (ref 12.0–15.0)
O2 Saturation: 94 %
Patient temperature: 98
Potassium: 3.5 mmol/L (ref 3.5–5.1)
Sodium: 136 mmol/L (ref 135–145)
TCO2: 27 mmol/L (ref 22–32)
pCO2 arterial: 37.1 mmHg (ref 32–48)
pH, Arterial: 7.444 (ref 7.35–7.45)
pO2, Arterial: 67 mmHg — ABNORMAL LOW (ref 83–108)

## 2023-09-08 LAB — URINALYSIS, ROUTINE W REFLEX MICROSCOPIC
Bacteria, UA: NONE SEEN
Bilirubin Urine: NEGATIVE
Glucose, UA: NEGATIVE mg/dL
Ketones, ur: 20 mg/dL — AB
Nitrite: NEGATIVE
Protein, ur: NEGATIVE mg/dL
Specific Gravity, Urine: 1.013 (ref 1.005–1.030)
pH: 5 (ref 5.0–8.0)

## 2023-09-08 LAB — BASIC METABOLIC PANEL
Anion gap: 8 (ref 5–15)
BUN: 8 mg/dL (ref 8–23)
CO2: 23 mmol/L (ref 22–32)
Calcium: 8.6 mg/dL — ABNORMAL LOW (ref 8.9–10.3)
Chloride: 106 mmol/L (ref 98–111)
Creatinine, Ser: 0.8 mg/dL (ref 0.44–1.00)
GFR, Estimated: >60 mL/min (ref >60)
Glucose, Bld: 155 mg/dL — ABNORMAL HIGH (ref 70–99)
Potassium: 3.8 mmol/L (ref 3.5–5.1)
Sodium: 137 mmol/L (ref 135–145)

## 2023-09-08 LAB — CBC
HCT: 38.3 % (ref 36.0–46.0)
Hemoglobin: 13.2 g/dL (ref 12.0–15.0)
MCH: 30.1 pg (ref 26.0–34.0)
MCHC: 34.5 g/dL (ref 30.0–36.0)
MCV: 87.2 fL (ref 80.0–100.0)
Platelets: 208 10*3/uL (ref 150–400)
RBC: 4.39 MIL/uL (ref 3.87–5.11)
RDW: 12.5 % (ref 11.5–15.5)
WBC: 6.6 10*3/uL (ref 4.0–10.5)
nRBC: 0 % (ref 0.0–0.2)

## 2023-09-08 LAB — HEPARIN LEVEL (UNFRACTIONATED): Heparin Unfractionated: 0.32 IU/mL (ref 0.30–0.70)

## 2023-09-08 LAB — ABO/RH: ABO/RH(D): A POS

## 2023-09-08 LAB — GLUCOSE, CAPILLARY
Glucose-Capillary: 162 mg/dL — ABNORMAL HIGH (ref 70–99)
Glucose-Capillary: 178 mg/dL — ABNORMAL HIGH (ref 70–99)
Glucose-Capillary: 292 mg/dL — ABNORMAL HIGH (ref 70–99)
Glucose-Capillary: 226 mg/dL — ABNORMAL HIGH (ref 70–99)
Glucose-Capillary: 226 mg/dL — ABNORMAL HIGH (ref 70–99)
Glucose-Capillary: 155 mg/dL — ABNORMAL HIGH (ref 70–99)
Glucose-Capillary: 190 mg/dL — ABNORMAL HIGH (ref 70–99)

## 2023-09-08 LAB — PROTIME-INR
INR: 1 (ref 0.8–1.2)
Prothrombin Time: 13.1 seconds (ref 11.4–15.2)

## 2023-09-08 LAB — SURGICAL PCR SCREEN
MRSA, PCR: NEGATIVE
Staphylococcus aureus: NEGATIVE

## 2023-09-08 LAB — TYPE AND SCREEN
ABO/RH(D): A POS
Antibody Screen: NEGATIVE

## 2023-09-08 LAB — APTT: aPTT: 57 seconds — ABNORMAL HIGH (ref 24–36)

## 2023-09-08 MED ORDER — CHLORHEXIDINE GLUCONATE CLOTH 2 % EX PADS
6.0000 | MEDICATED_PAD | Freq: Once | CUTANEOUS | Status: AC
  Administered 2023-09-09: 6 via TOPICAL

## 2023-09-08 MED ORDER — TRANEXAMIC ACID 1000 MG/10ML IV SOLN
1.5000 mg/kg/h | INTRAVENOUS | Status: AC
  Administered 2023-09-09: 1.5 mg/kg/h via INTRAVENOUS
  Filled 2023-09-08: qty 25

## 2023-09-08 MED ORDER — MAGNESIUM SULFATE 50 % IJ SOLN
40.0000 meq | INTRAMUSCULAR | Status: DC
  Filled 2023-09-08: qty 9.85

## 2023-09-08 MED ORDER — FLUTICASONE PROPIONATE 50 MCG/ACT NA SUSP
2.0000 | Freq: Every day | NASAL | Status: DC
  Administered 2023-09-08: 2 via NASAL
  Filled 2023-09-08: qty 16

## 2023-09-08 MED ORDER — INSULIN REGULAR(HUMAN) IN NACL 100-0.9 UT/100ML-% IV SOLN
INTRAVENOUS | Status: AC
  Administered 2023-09-09: 4.2 [IU]/h via INTRAVENOUS
  Filled 2023-09-08: qty 100

## 2023-09-08 MED ORDER — PLASMA-LYTE A IV SOLN
INTRAVENOUS | Status: DC
  Filled 2023-09-08: qty 2.5

## 2023-09-08 MED ORDER — VANCOMYCIN HCL 1500 MG/300ML IV SOLN
1500.0000 mg | INTRAVENOUS | Status: AC
  Administered 2023-09-09: 1500 mg via INTRAVENOUS
  Filled 2023-09-08: qty 300

## 2023-09-08 MED ORDER — LORATADINE 10 MG PO TABS
10.0000 mg | ORAL_TABLET | Freq: Every day | ORAL | Status: DC
  Administered 2023-09-08: 10 mg via ORAL
  Filled 2023-09-08: qty 1

## 2023-09-08 MED ORDER — METOPROLOL TARTRATE 12.5 MG HALF TABLET
12.5000 mg | ORAL_TABLET | Freq: Once | ORAL | Status: AC
  Administered 2023-09-09: 12.5 mg via ORAL
  Filled 2023-09-08: qty 1

## 2023-09-08 MED ORDER — TRANEXAMIC ACID (OHS) PUMP PRIME SOLUTION
2.0000 mg/kg | INTRAVENOUS | Status: DC
  Filled 2023-09-08: qty 1.87

## 2023-09-08 MED ORDER — BISACODYL 5 MG PO TBEC
5.0000 mg | DELAYED_RELEASE_TABLET | Freq: Once | ORAL | Status: AC
  Administered 2023-09-08: 5 mg via ORAL
  Filled 2023-09-08: qty 1

## 2023-09-08 MED ORDER — TEMAZEPAM 15 MG PO CAPS: 15.0000 mg | ORAL_CAPSULE | Freq: Once | ORAL | Status: DC | PRN

## 2023-09-08 MED ORDER — CHLORHEXIDINE GLUCONATE 0.12 % MT SOLN
15.0000 mL | Freq: Once | OROMUCOSAL | Status: AC
  Administered 2023-09-09: 15 mL via OROMUCOSAL
  Filled 2023-09-08: qty 15

## 2023-09-08 MED ORDER — EPINEPHRINE HCL 5 MG/250ML IV SOLN IN NS
0.0000 ug/min | INTRAVENOUS | Status: DC
  Filled 2023-09-08: qty 250

## 2023-09-08 MED ORDER — TRANEXAMIC ACID (OHS) BOLUS VIA INFUSION
15.0000 mg/kg | INTRAVENOUS | Status: AC
  Administered 2023-09-09: 1402.5 mg via INTRAVENOUS
  Filled 2023-09-08: qty 1403

## 2023-09-08 MED ORDER — MILRINONE LACTATE IN DEXTROSE 20-5 MG/100ML-% IV SOLN
0.3000 ug/kg/min | INTRAVENOUS | Status: DC
  Filled 2023-09-08: qty 100

## 2023-09-08 MED ORDER — POTASSIUM CHLORIDE 2 MEQ/ML IV SOLN
80.0000 meq | INTRAVENOUS | Status: DC
  Filled 2023-09-08: qty 40

## 2023-09-08 MED ORDER — DIAZEPAM 2 MG PO TABS
5.0000 mg | ORAL_TABLET | Freq: Once | ORAL | Status: AC
  Administered 2023-09-09: 5 mg via ORAL
  Filled 2023-09-08: qty 3

## 2023-09-08 MED ORDER — CEFAZOLIN SODIUM-DEXTROSE 2-4 GM/100ML-% IV SOLN
2.0000 g | INTRAVENOUS | Status: AC
  Administered 2023-09-09: 2 g via INTRAVENOUS
  Filled 2023-09-08: qty 100

## 2023-09-08 MED ORDER — PHENYLEPHRINE HCL-NACL 20-0.9 MG/250ML-% IV SOLN
30.0000 ug/min | INTRAVENOUS | Status: AC
  Administered 2023-09-09: 25 ug/min via INTRAVENOUS
  Filled 2023-09-08: qty 250

## 2023-09-08 MED ORDER — NOREPINEPHRINE 4 MG/250ML-% IV SOLN
0.0000 ug/min | INTRAVENOUS | Status: DC
  Filled 2023-09-08: qty 250

## 2023-09-08 MED ORDER — DEXMEDETOMIDINE HCL IN NACL 400 MCG/100ML IV SOLN
0.1000 ug/kg/h | INTRAVENOUS | Status: AC
  Administered 2023-09-09: .3 ug/kg/h via INTRAVENOUS
  Filled 2023-09-08: qty 100

## 2023-09-08 MED ORDER — HEPARIN 30,000 UNITS/1000 ML (OHS) CELLSAVER SOLUTION
Status: DC
  Filled 2023-09-08: qty 1000

## 2023-09-08 MED ORDER — NITROGLYCERIN IN D5W 200-5 MCG/ML-% IV SOLN
2.0000 ug/min | INTRAVENOUS | Status: DC
  Filled 2023-09-08: qty 250

## 2023-09-08 MED ORDER — CHLORHEXIDINE GLUCONATE CLOTH 2 % EX PADS
6.0000 | MEDICATED_PAD | Freq: Once | CUTANEOUS | Status: AC
  Administered 2023-09-08: 6 via TOPICAL

## 2023-09-08 MED ORDER — PERFLUTREN LIPID MICROSPHERE
1.0000 mL | INTRAVENOUS | Status: AC | PRN
  Administered 2023-09-08: 6 mL via INTRAVENOUS

## 2023-09-08 MED FILL — Nitroglycerin IV Soln 100 MCG/ML in D5W: INTRA_ARTERIAL | Qty: 10 | Status: CN

## 2023-09-08 NOTE — Progress Notes (Signed)
Pre-CABG exams have been completed.    Results can be found under chart review under CV PROC. 09/08/2023 4:52 PM Ceazia Harb RVT, RDMS

## 2023-09-08 NOTE — Progress Notes (Signed)
Cardiology Progress Note  Patient ID: Kerry Compton MRN: [id number] DOB: 06-Feb-1956 Date of Encounter: 09/08/2023  Primary Cardiologist: Reatha Harps, MD  Subjective   Chief Complaint: None.   HPI: Occluded LAD and severe LCX disease. Not amendable to PCI. CABG tomorrow.   ROS:  All other ROS reviewed and negative. Pertinent positives noted in the HPI.     Inpatient Medications  Scheduled Meds:  amLODipine  2.5 mg Oral Daily   aspirin EC  81 mg Oral Daily   atorvastatin  80 mg Oral Daily   [START ON 09/09/2023] epinephrine  0-10 mcg/min Intravenous To OR   fluticasone  2 spray Each Nare Daily   furosemide  20 mg Intravenous Once   [START ON 09/09/2023] heparin sodium (porcine) 2,500 Units, papaverine 30 mg in electrolyte-A (PLASMALYTE-A PH 7.4) 500 mL irrigation   Irrigation To OR   insulin aspart  0-9 Units Subcutaneous Q4H   insulin aspart  4 Units Subcutaneous TID WC   insulin glargine-yfgn  20 Units Subcutaneous QHS   [START ON 09/09/2023] insulin   Intravenous To OR   irbesartan  300 mg Oral Daily   loratadine  10 mg Oral Daily   [START ON 09/09/2023] magnesium sulfate  40 mEq Other To OR   metoprolol succinate  25 mg Oral Daily   [START ON 09/09/2023] phenylephrine  30-200 mcg/min Intravenous To OR   [START ON 09/09/2023] potassium chloride  80 mEq Other To OR   sodium chloride flush  3 mL Intravenous Q12H   spironolactone  25 mg Oral Daily   [START ON 09/09/2023] tranexamic acid  15 mg/kg Intravenous To OR   [START ON 09/09/2023] tranexamic acid  2 mg/kg Intracatheter To OR   Continuous Infusions:  sodium chloride     [START ON 09/09/2023]  ceFAZolin (ANCEF) IV     [START ON 09/09/2023]  ceFAZolin (ANCEF) IV     [START ON 09/09/2023] dexmedetomidine     [START ON 09/09/2023] heparin 30,000 units/NS 1000 mL solution for CELLSAVER     heparin 1,100 Units/hr (09/08/23 0637)   [START ON 09/09/2023] milrinone     [START ON 09/09/2023] nitroGLYCERIN     [START ON  09/09/2023] norepinephrine     [START ON 09/09/2023] tranexamic acid (CYKLOKAPRON) 2,500 mg in sodium chloride 0.9 % 250 mL (10 mg/mL) infusion     [START ON 09/09/2023] vancomycin     PRN Meds: sodium chloride, acetaminophen, nitroGLYCERIN, ondansetron (ZOFRAN) IV, sodium chloride flush   Vital Signs   Vitals:   09/07/23 2000 09/07/23 2340 09/08/23 0418 09/08/23 0723  BP: (!) 117/54 134/69 (!) 146/68 (!) 157/73  Pulse: 77 81 89 92  Resp: 17 16 19 19   Temp: 98 F (36.7 C) 98.1 F (36.7 C) 98.1 F (36.7 C) 98 F (36.7 C)  TempSrc: Oral Oral Oral Oral  SpO2: 93% 91% 93% 94%  Weight:      Height:        Intake/Output Summary (Last 24 hours) at 09/08/2023 1010 Last data filed at 09/08/2023 0726 Gross per 24 hour  Intake 1144.93 ml  Output 1000 ml  Net 144.93 ml      09/06/2023   11:00 PM 09/06/2023    3:04 PM 06/15/2020    2:43 PM  Last 3 Weights  Weight (lbs) 206 lb 2.1 oz 214 lb 15.2 oz 215 lb  Weight (kg) 93.5 kg 97.5 kg 97.523 kg      Telemetry  Overnight telemetry shows SR 80s, which  I personally reviewed.   ECG  The most recent ECG shows sinus rhythm heart 73, anteroseptal infarct, which I personally reviewed.   Physical Exam   Vitals:   09/07/23 2000 09/07/23 2340 09/08/23 0418 09/08/23 0723  BP: (!) 117/54 134/69 (!) 146/68 (!) 157/73  Pulse: 77 81 89 92  Resp: 17 16 19 19   Temp: 98 F (36.7 C) 98.1 F (36.7 C) 98.1 F (36.7 C) 98 F (36.7 C)  TempSrc: Oral Oral Oral Oral  SpO2: 93% 91% 93% 94%  Weight:      Height:        Intake/Output Summary (Last 24 hours) at 09/08/2023 1010 Last data filed at 09/08/2023 0726 Gross per 24 hour  Intake 1144.93 ml  Output 1000 ml  Net 144.93 ml       09/06/2023   11:00 PM 09/06/2023    3:04 PM 06/15/2020    2:43 PM  Last 3 Weights  Weight (lbs) 206 lb 2.1 oz 214 lb 15.2 oz 215 lb  Weight (kg) 93.5 kg 97.5 kg 97.523 kg    Body mass index is 35.38 kg/m.  General: Well nourished, well developed, in no acute  distress Head: Atraumatic, normal size  Eyes: PEERLA, EOMI  Neck: Supple, no JVD Endocrine: No thryomegaly Cardiac: Normal S1, S2; RRR; no murmurs, rubs, or gallops Lungs: Clear to auscultation bilaterally, no wheezing, rhonchi or rales  Abd: Soft, nontender, no hepatomegaly  Ext: No edema, pulses 2+ Musculoskeletal: No deformities, BUE and BLE strength normal and equal Skin: Warm and dry, no rashes   Neuro: Alert and oriented to person, place, time, and situation, CNII-XII grossly intact, no focal deficits  Psych: Normal mood and affect   Labs  High Sensitivity Troponin:   Recent Labs  Lab 09/06/23 1526 09/06/23 1710  TROPONINIHS 103* 111*     Cardiac EnzymesNo results for input(s): "TROPONINI" in the last 168 hours. No results for input(s): "TROPIPOC" in the last 168 hours.  Chemistry Recent Labs  Lab 09/06/23 1526 09/07/23 0202 09/08/23 0231  NA 141 136 137  K 4.5 3.8 3.8  CL 106 104 106  CO2 25 24 23   GLUCOSE 96 271* 155*  BUN 11 14 8   CREATININE 0.66 0.75 0.80  CALCIUM 9.3 8.5* 8.6*  GFRNONAA >60 >60 >60  ANIONGAP 10 8 8     Hematology Recent Labs  Lab 09/06/23 1526 09/07/23 0202 09/08/23 0231  WBC 7.1 9.6 6.6  RBC 5.01 4.32 4.39  HGB 14.8 12.3 13.2  HCT 44.7 37.5 38.3  MCV 89.2 86.8 87.2  MCH 29.5 28.5 30.1  MCHC 33.1 32.8 34.5  RDW 12.6 12.6 12.5  PLT 251 165 208   BNPNo results for input(s): "BNP", "PROBNP" in the last 168 hours.  DDimer No results for input(s): "DDIMER" in the last 168 hours.   Radiology  ECHOCARDIOGRAM COMPLETE  Result Date: 09/07/2023    ECHOCARDIOGRAM REPORT   Patient Name:   Kerry Compton Date of Exam: 09/07/2023 Medical Rec #:  [id number]            Height:       64.0 in Accession #:    [id number]           Weight:       206.1 lb Date of Birth:  [birth year]           BSA:          1.982 m Patient Age:    67 years  I personally reviewed.   ECG  The most recent ECG shows sinus rhythm heart 73, anteroseptal infarct, which I personally reviewed.   Physical Exam   Vitals:   09/07/23 2000 09/07/23 2340 09/08/23 0418 09/08/23 0723  BP: (!) 117/54 134/69 (!) 146/68 (!) 157/73  Pulse: 77 81 89 92  Resp: 17 16 19 19   Temp: 98 F (36.7 C) 98.1 F (36.7 C) 98.1 F (36.7 C) 98 F (36.7 C)  TempSrc: Oral Oral Oral Oral  SpO2: 93% 91% 93% 94%  Weight:      Height:        Intake/Output Summary (Last 24 hours) at 09/08/2023 1010 Last data filed at 09/08/2023 0726 Gross per 24 hour  Intake 1144.93 ml  Output 1000 ml  Net 144.93 ml       09/06/2023   11:00 PM 09/06/2023    3:04 PM 06/15/2020    2:43 PM  Last 3 Weights  Weight (lbs) 206 lb 2.1 oz 214 lb 15.2 oz 215 lb  Weight (kg) 93.5 kg 97.5 kg 97.523 kg    Body mass index is 35.38 kg/m.  General: Well nourished, well developed, in no acute  distress Head: Atraumatic, normal size  Eyes: PEERLA, EOMI  Neck: Supple, no JVD Endocrine: No thryomegaly Cardiac: Normal S1, S2; RRR; no murmurs, rubs, or gallops Lungs: Clear to auscultation bilaterally, no wheezing, rhonchi or rales  Abd: Soft, nontender, no hepatomegaly  Ext: No edema, pulses 2+ Musculoskeletal: No deformities, BUE and BLE strength normal and equal Skin: Warm and dry, no rashes   Neuro: Alert and oriented to person, place, time, and situation, CNII-XII grossly intact, no focal deficits  Psych: Normal mood and affect   Labs  High Sensitivity Troponin:   Recent Labs  Lab 09/06/23 1526 09/06/23 1710  TROPONINIHS 103* 111*     Cardiac EnzymesNo results for input(s): "TROPONINI" in the last 168 hours. No results for input(s): "TROPIPOC" in the last 168 hours.  Chemistry Recent Labs  Lab 09/06/23 1526 09/07/23 0202 09/08/23 0231  NA 141 136 137  K 4.5 3.8 3.8  CL 106 104 106  CO2 25 24 23   GLUCOSE 96 271* 155*  BUN 11 14 8   CREATININE 0.66 0.75 0.80  CALCIUM 9.3 8.5* 8.6*  GFRNONAA >60 >60 >60  ANIONGAP 10 8 8     Hematology Recent Labs  Lab 09/06/23 1526 09/07/23 0202 09/08/23 0231  WBC 7.1 9.6 6.6  RBC 5.01 4.32 4.39  HGB 14.8 12.3 13.2  HCT 44.7 37.5 38.3  MCV 89.2 86.8 87.2  MCH 29.5 28.5 30.1  MCHC 33.1 32.8 34.5  RDW 12.6 12.6 12.5  PLT 251 165 208   BNPNo results for input(s): "BNP", "PROBNP" in the last 168 hours.  DDimer No results for input(s): "DDIMER" in the last 168 hours.   Radiology  ECHOCARDIOGRAM COMPLETE  Result Date: 09/07/2023    ECHOCARDIOGRAM REPORT   Patient Name:   Kerry Compton Date of Exam: 09/07/2023 Medical Rec #:  [id number]            Height:       64.0 in Accession #:    [id number]           Weight:       206.1 lb Date of Birth:  [birth year]           BSA:          1.982 m Patient Age:    67 years  I personally reviewed.   ECG  The most recent ECG shows sinus rhythm heart 73, anteroseptal infarct, which I personally reviewed.   Physical Exam   Vitals:   09/07/23 2000 09/07/23 2340 09/08/23 0418 09/08/23 0723  BP: (!) 117/54 134/69 (!) 146/68 (!) 157/73  Pulse: 77 81 89 92  Resp: 17 16 19 19   Temp: 98 F (36.7 C) 98.1 F (36.7 C) 98.1 F (36.7 C) 98 F (36.7 C)  TempSrc: Oral Oral Oral Oral  SpO2: 93% 91% 93% 94%  Weight:      Height:        Intake/Output Summary (Last 24 hours) at 09/08/2023 1010 Last data filed at 09/08/2023 0726 Gross per 24 hour  Intake 1144.93 ml  Output 1000 ml  Net 144.93 ml       09/06/2023   11:00 PM 09/06/2023    3:04 PM 06/15/2020    2:43 PM  Last 3 Weights  Weight (lbs) 206 lb 2.1 oz 214 lb 15.2 oz 215 lb  Weight (kg) 93.5 kg 97.5 kg 97.523 kg    Body mass index is 35.38 kg/m.  General: Well nourished, well developed, in no acute  distress Head: Atraumatic, normal size  Eyes: PEERLA, EOMI  Neck: Supple, no JVD Endocrine: No thryomegaly Cardiac: Normal S1, S2; RRR; no murmurs, rubs, or gallops Lungs: Clear to auscultation bilaterally, no wheezing, rhonchi or rales  Abd: Soft, nontender, no hepatomegaly  Ext: No edema, pulses 2+ Musculoskeletal: No deformities, BUE and BLE strength normal and equal Skin: Warm and dry, no rashes   Neuro: Alert and oriented to person, place, time, and situation, CNII-XII grossly intact, no focal deficits  Psych: Normal mood and affect   Labs  High Sensitivity Troponin:   Recent Labs  Lab 09/06/23 1526 09/06/23 1710  TROPONINIHS 103* 111*     Cardiac EnzymesNo results for input(s): "TROPONINI" in the last 168 hours. No results for input(s): "TROPIPOC" in the last 168 hours.  Chemistry Recent Labs  Lab 09/06/23 1526 09/07/23 0202 09/08/23 0231  NA 141 136 137  K 4.5 3.8 3.8  CL 106 104 106  CO2 25 24 23   GLUCOSE 96 271* 155*  BUN 11 14 8   CREATININE 0.66 0.75 0.80  CALCIUM 9.3 8.5* 8.6*  GFRNONAA >60 >60 >60  ANIONGAP 10 8 8     Hematology Recent Labs  Lab 09/06/23 1526 09/07/23 0202 09/08/23 0231  WBC 7.1 9.6 6.6  RBC 5.01 4.32 4.39  HGB 14.8 12.3 13.2  HCT 44.7 37.5 38.3  MCV 89.2 86.8 87.2  MCH 29.5 28.5 30.1  MCHC 33.1 32.8 34.5  RDW 12.6 12.6 12.5  PLT 251 165 208   BNPNo results for input(s): "BNP", "PROBNP" in the last 168 hours.  DDimer No results for input(s): "DDIMER" in the last 168 hours.   Radiology  ECHOCARDIOGRAM COMPLETE  Result Date: 09/07/2023    ECHOCARDIOGRAM REPORT   Patient Name:   Kerry Compton Date of Exam: 09/07/2023 Medical Rec #:  [id number]            Height:       64.0 in Accession #:    [id number]           Weight:       206.1 lb Date of Birth:  [birth year]           BSA:          1.982 m Patient Age:    67 years  I personally reviewed.   ECG  The most recent ECG shows sinus rhythm heart 73, anteroseptal infarct, which I personally reviewed.   Physical Exam   Vitals:   09/07/23 2000 09/07/23 2340 09/08/23 0418 09/08/23 0723  BP: (!) 117/54 134/69 (!) 146/68 (!) 157/73  Pulse: 77 81 89 92  Resp: 17 16 19 19   Temp: 98 F (36.7 C) 98.1 F (36.7 C) 98.1 F (36.7 C) 98 F (36.7 C)  TempSrc: Oral Oral Oral Oral  SpO2: 93% 91% 93% 94%  Weight:      Height:        Intake/Output Summary (Last 24 hours) at 09/08/2023 1010 Last data filed at 09/08/2023 0726 Gross per 24 hour  Intake 1144.93 ml  Output 1000 ml  Net 144.93 ml       09/06/2023   11:00 PM 09/06/2023    3:04 PM 06/15/2020    2:43 PM  Last 3 Weights  Weight (lbs) 206 lb 2.1 oz 214 lb 15.2 oz 215 lb  Weight (kg) 93.5 kg 97.5 kg 97.523 kg    Body mass index is 35.38 kg/m.  General: Well nourished, well developed, in no acute  distress Head: Atraumatic, normal size  Eyes: PEERLA, EOMI  Neck: Supple, no JVD Endocrine: No thryomegaly Cardiac: Normal S1, S2; RRR; no murmurs, rubs, or gallops Lungs: Clear to auscultation bilaterally, no wheezing, rhonchi or rales  Abd: Soft, nontender, no hepatomegaly  Ext: No edema, pulses 2+ Musculoskeletal: No deformities, BUE and BLE strength normal and equal Skin: Warm and dry, no rashes   Neuro: Alert and oriented to person, place, time, and situation, CNII-XII grossly intact, no focal deficits  Psych: Normal mood and affect   Labs  High Sensitivity Troponin:   Recent Labs  Lab 09/06/23 1526 09/06/23 1710  TROPONINIHS 103* 111*     Cardiac EnzymesNo results for input(s): "TROPONINI" in the last 168 hours. No results for input(s): "TROPIPOC" in the last 168 hours.  Chemistry Recent Labs  Lab 09/06/23 1526 09/07/23 0202 09/08/23 0231  NA 141 136 137  K 4.5 3.8 3.8  CL 106 104 106  CO2 25 24 23   GLUCOSE 96 271* 155*  BUN 11 14 8   CREATININE 0.66 0.75 0.80  CALCIUM 9.3 8.5* 8.6*  GFRNONAA >60 >60 >60  ANIONGAP 10 8 8     Hematology Recent Labs  Lab 09/06/23 1526 09/07/23 0202 09/08/23 0231  WBC 7.1 9.6 6.6  RBC 5.01 4.32 4.39  HGB 14.8 12.3 13.2  HCT 44.7 37.5 38.3  MCV 89.2 86.8 87.2  MCH 29.5 28.5 30.1  MCHC 33.1 32.8 34.5  RDW 12.6 12.6 12.5  PLT 251 165 208   BNPNo results for input(s): "BNP", "PROBNP" in the last 168 hours.  DDimer No results for input(s): "DDIMER" in the last 168 hours.   Radiology  ECHOCARDIOGRAM COMPLETE  Result Date: 09/07/2023    ECHOCARDIOGRAM REPORT   Patient Name:   Kerry Compton Date of Exam: 09/07/2023 Medical Rec #:  [id number]            Height:       64.0 in Accession #:    [id number]           Weight:       206.1 lb Date of Birth:  [birth year]           BSA:          1.982 m Patient Age:    67 years

## 2023-09-08 NOTE — Plan of Care (Signed)
  Problem: Health Behavior/Discharge Planning: Goal: Ability to manage health-related needs will improve Outcome: Progressing   Problem: Nutritional: Goal: Maintenance of adequate nutrition will improve Outcome: Progressing   Problem: Tissue Perfusion: Goal: Adequacy of tissue perfusion will improve Outcome: Progressing   Problem: Education: Goal: Knowledge of General Education information will improve Description: Including pain rating scale, medication(s)/side effects and non-pharmacologic comfort measures Outcome: Progressing   Problem: Health Behavior/Discharge Planning: Goal: Ability to manage health-related needs will improve Outcome: Progressing   Problem: Clinical Measurements: Goal: Will remain free from infection Outcome: Progressing Goal: Diagnostic test results will improve Outcome: Progressing   Problem: Nutrition: Goal: Adequate nutrition will be maintained Outcome: Progressing   Problem: Elimination: Goal: Will not experience complications related to urinary retention Outcome: Progressing   Problem: Pain Managment: Goal: General experience of comfort will improve Outcome: Progressing   Problem: Safety: Goal: Ability to remain free from injury will improve Outcome: Progressing   Problem: Skin Integrity: Goal: Risk for impaired skin integrity will decrease Outcome: Progressing

## 2023-09-08 NOTE — Progress Notes (Signed)
PROGRESS NOTE    Kerry Compton  GMW:[id number] DOB: 02/03/1956 DOA: 09/06/2023 PCP: Laurann Montana, MD    Brief Narrative:   Kerry Compton is a 67 y.o. female with past medical history significant for insulin-dependent diabetes mellitus, HTN, hyperlipidemia who presented to [facility] ED on 9/24 from walk-in clinic for chest pain and abnormal EKG.  Onset of symptoms 09/03/2023 with acute onset substernal chest pain associate with shortness of breath, worsened while walking up an incline.  Pain resolved with rest but reoccurred once again day prior to admission while ascending stairs.  Continue to experience exertional chest discomfort that resolves with rest. Denies leg swelling, no fevers, no chills, no cough.  In the ED, temperature 98.2 F, HR 88, RR 18, BP 182/83, SpO2 99% on room air.  WBC 7.1, hemoglobin 14.8, platelet count 251.  Sodium 141, potassium 4.5, chloride 106, CO2 25, glucose 96, BUN 11, creatinine 0.66.  High sensitivity troponin 103 followed by 111.  COVID PCR negative.  Chest x-ray with no active cardiopulmonary disease process.  EKG with normal sinus rhythm, no ST elevation; but Q waves noted anterior leads.  Cardiology was consulted.  Patient was started on heparin drip.  TRH consulted for admission for further evaluation management of NSTEMI.  Assessment & Plan:   NSTEMI Multivessel CAD Patient presenting with chest pain associated with exertion and shortness of breath.  Patient is afebrile without leukocytosis.  Chest x-ray with no acute cardiopulmonary disease process.  High sensitive troponin elevated 103>111.  Hemoglobin A1c 7.0.  LDL 77.  TSH 0.967, within normal limits.  TTE with LVEF 50-55%, LV low normal function, LV with regional wall motion abnormalities, grade 1 diastolic dysfunction, RV systolic function normal, trivial MR, no aortic stenosis, IVC normal in size.  Left heart catheterization 9/25 with severe two-vessel obstructive CAD,  occluded proximal LAD with collaterals, high-grade proximal left circumflex lesion 90% stenosed. -- Cardiology and CTS following, appreciate assistance -- Lipoprotein a: Pending -- Metoprolol 25 mg p.o. twice daily -- Atorvastatin 80 mg p.o. daily -- Heparin drip -- Pending CABG 9/27, n.p.o. after midnight  Essential hypertension -- Metoprolol succinate 25 mg p.o. daily -- Irbesartan 300 mg p.o. daily -- Spironolactone 25 mg p.o. daily  Hyperlipidemia Lipid panel with total cholesterol 137, HDL 50, LDL 77, triglycerides 50. -- Atorvastatin 80 mg p.o. daily  Type 2 diabetes mellitus On insulin pump at baseline.  Hemoglobin A1c 7.0, well-controlled. -- Semglee 20u [place] daily -- NovoLog 4 units 3 times daily AC -- SSI for coverage -- CBGs q4h while NPO  Obesity Body mass index is 35.38 kg/m.  Complicates all facets of care   DVT prophylaxis: Heparin drip    Code Status: Full Code Family Communication: No family present at bedside this morning  Disposition Plan:  Level of care: Telemetry Cardiac Status is: Inpatient Remains inpatient appropriate because: Pending CABG for tomorrow    Consultants:  Cardiology Cardiothoracic surgery  Procedures:  TTE:   Antimicrobials:  None   Subjective: Patient seen examined bedside, resting calmly.  Lying in bed.  Left heart catheterization yesterday shows multivessel CAD.  Cardiothoracic surgery plans CABG tomorrow.  Patient remains chest pain-free; on heparin drip. No other specific complaints or concerns at this time.  Denies headache, no visual changes, no chest pain, no palpitations, no shortness of breath, no abdominal pain, no fever/chills/night sweats, no nausea/vomiting/diarrhea, no focal weakness, no fatigue, no paresthesias.  No acute events overnight per nursing staff.  Objective: Vitals:  09/07/23 2000 09/07/23 2340 09/08/23 0418 09/08/23 0723  BP: (!) 117/54 134/69 (!) 146/68 (!) 157/73  Pulse: 77 81 89 92  Resp: 17  16 19 19   Temp: 98 F (36.7 C) 98.1 F (36.7 C) 98.1 F (36.7 C) 98 F (36.7 C)  TempSrc: Oral Oral Oral Oral  SpO2: 93% 91% 93% 94%  Weight:      Height:        Intake/Output Summary (Last 24 hours) at 09/08/2023 0921 Last data filed at 09/08/2023 0726 Gross per 24 hour  Intake 1144.93 ml  Output 1000 ml  Net 144.93 ml   Filed Weights   09/06/23 1504 09/06/23 2300  Weight: 97.5 kg 93.5 kg    Examination:  Physical Exam: GEN: NAD, alert and oriented x 3, obese HEENT: NCAT, PERRL, EOMI, sclera clear, MMM PULM: CTAB w/o wheezes/crackles, normal respiratory effort, on room air CV: RRR w/o M/G/R GI: abd soft, NTND, NABS, no R/G/M MSK: no peripheral edema, muscle strength globally intact 5/5 bilateral upper/lower extremities NEURO: CN II-XII intact, no focal deficits, sensation to light touch intact PSYCH: normal mood/affect Integumentary: dry/intact, no rashes or wounds    Data Reviewed: I have personally reviewed following labs and imaging studies  CBC: Recent Labs  Lab 09/06/23 1526 09/07/23 0202 09/08/23 0231  WBC 7.1 9.6 6.6  HGB 14.8 12.3 13.2  HCT 44.7 37.5 38.3  MCV 89.2 86.8 87.2  PLT 251 165 208   Basic Metabolic Panel: Recent Labs  Lab 09/06/23 1526 09/07/23 0202 09/08/23 0231  NA 141 136 137  K 4.5 3.8 3.8  CL 106 104 106  CO2 25 24 23   GLUCOSE 96 271* 155*  BUN 11 14 8   CREATININE 0.66 0.75 0.80  CALCIUM 9.3 8.5* 8.6*   GFR: Estimated Creatinine Clearance: 76.7 mL/min (by C-G formula based on SCr of 0.8 mg/dL). Liver Function Tests: No results for input(s): "AST", "ALT", "ALKPHOS", "BILITOT", "PROT", "ALBUMIN" in the last 168 hours. No results for input(s): "LIPASE", "AMYLASE" in the last 168 hours. No results for input(s): "AMMONIA" in the last 168 hours. Coagulation Profile: No results for input(s): "INR", "PROTIME" in the last 168 hours. Cardiac Enzymes: No results for input(s): "CKTOTAL", "CKMB", "CKMBINDEX", "TROPONINI" in the  last 168 hours. BNP (last 3 results) No results for input(s): "PROBNP" in the last 8760 hours. HbA1C: Recent Labs    09/07/23 0202  HGBA1C 7.0*   CBG: Recent Labs  Lab 09/07/23 1745 09/07/23 1952 09/07/23 2337 09/08/23 0419 09/08/23 0726  GLUCAP 318* 179* 128* 162* 178*   Lipid Profile: Recent Labs    09/07/23 0205  CHOL 137  HDL 50  LDLCALC 77  TRIG 50  CHOLHDL 2.7   Thyroid Function Tests: Recent Labs    09/07/23 0205  TSH 0.967   Anemia Panel: No results for input(s): "VITAMINB12", "FOLATE", "FERRITIN", "TIBC", "IRON", "RETICCTPCT" in the last 72 hours. Sepsis Labs: No results for input(s): "PROCALCITON", "LATICACIDVEN" in the last 168 hours.  Recent Results (from the past 240 hour(s))  SARS Coronavirus 2 by RT PCR (hospital order, performed in [facility] hospital lab) *cepheid single result test* Anterior Nasal Swab     Status: None   Collection Time: 09/06/23  5:10 PM   Specimen: Anterior Nasal Swab  Result Value Ref Range Status   SARS Coronavirus 2 by RT PCR NEGATIVE NEGATIVE Final    Comment: Performed at [facility], [address], [place], [place] [address]  MRSA Next Gen by PCR,  Nasal     Status: None   Collection Time: 09/06/23 11:20 PM   Specimen: Nasal Mucosa; Nasal Swab  Result Value Ref Range Status   MRSA by PCR Next Gen NOT DETECTED NOT DETECTED Final    Comment: (NOTE) The GeneXpert MRSA Assay (FDA approved for NASAL specimens only), is one component of a comprehensive MRSA colonization surveillance program. It is not intended to diagnose MRSA infection nor to guide or monitor treatment for MRSA infections. Test performance is not FDA approved in patients less than 59 years old. Performed at [facility], [address], [place], [place] [address]          Radiology Studies: ECHOCARDIOGRAM COMPLETE  Result Date: 09/07/2023    ECHOCARDIOGRAM REPORT   Patient Name:   ARNEL HAMBLET Date of Exam: 09/07/2023  Medical Rec #:  [id number]            Height:       64.0 in Accession #:    [id number]           Weight:       206.1 lb Date of Birth:  [birth year]           BSA:          1.982 m Patient Age:    66 years             BP:           155/78 mmHg Patient Gender: F                    HR:           77 bpm. Exam Location:  Inpatient Procedure: 2D Echo, Cardiac Doppler, Color Doppler and Intracardiac            Opacification Agent Indications:    NSTEMI I21.4  History:        Patient has no prior history of Echocardiogram examinations.                 NSTEMI; Risk Factors:Hypertension, Diabetes, Dyslipidemia and                 Non-Smoker.  Sonographer:    Aron Baba Referring Phys: [id number] TIMOTHY S OPYD  Sonographer Comments: Technically difficult study due to poor echo windows. IMPRESSIONS  1. Left ventricular ejection fraction, by estimation, is 50 to 55%. The left ventricle has low normal function. The left ventricle demonstrates regional wall motion abnormalities (see scoring diagram/findings for description). Left ventricular diastolic  parameters are consistent with Grade I diastolic dysfunction (impaired relaxation).  2. Right ventricular systolic function is normal. The right ventricular size is normal.  3. The mitral valve is degenerative. Trivial mitral valve regurgitation. No evidence of mitral stenosis.  4. The aortic valve is tricuspid. There is moderate calcification of the aortic valve. Aortic valve regurgitation is not visualized. No aortic stenosis is present.  5. The inferior vena cava is normal in size with greater than 50% respiratory variability, suggesting right atrial pressure of 3 mmHg. FINDINGS  Left Ventricle: Left ventricular ejection fraction, by estimation, is 50 to 55%. The left ventricle has low normal function. The left ventricle demonstrates regional wall motion abnormalities. Definity contrast agent was given IV to delineate the left ventricular endocardial borders. The left ventricular  internal cavity size was normal in size. There is no left ventricular hypertrophy. Left ventricular diastolic parameters are consistent with Grade I diastolic dysfunction (impaired relaxation).  LV Wall  Scoring: The mid and distal anterior septum, mid and distal inferior wall, and mid inferolateral segment are hypokinetic. Right Ventricle: The right ventricular size is normal. No increase in right ventricular wall thickness. Right ventricular systolic function is normal. Left Atrium: Left atrial size was normal in size. Right Atrium: Right atrial size was normal in size. Pericardium: There is no evidence of pericardial effusion. Mitral Valve: The mitral valve is degenerative in appearance. There is mild calcification of the mitral valve leaflet(s). Mild mitral annular calcification. Trivial mitral valve regurgitation. No evidence of mitral valve stenosis. Tricuspid Valve: The tricuspid valve is normal in structure. Tricuspid valve regurgitation is trivial. No evidence of tricuspid stenosis. Aortic Valve: The aortic valve is tricuspid. There is moderate calcification of the aortic valve. Aortic valve regurgitation is not visualized. No aortic stenosis is present. Pulmonic Valve: The pulmonic valve was normal in structure. Pulmonic valve regurgitation is not visualized. No evidence of pulmonic stenosis. Aorta: The aortic root is normal in size and structure. Venous: The inferior vena cava is normal in size with greater than 50% respiratory variability, suggesting right atrial pressure of 3 mmHg. IAS/Shunts: No atrial level shunt detected by color flow Doppler.  LEFT VENTRICLE PLAX 2D LVIDd:         3.20 cm     Diastology LVIDs:         2.90 cm     LV e' medial:    6.42 cm/s LV PW:         0.80 cm     LV E/e' medial:  13.9 LV IVS:        1.00 cm     LV e' lateral:   8.27 cm/s LVOT diam:     1.70 cm     LV E/e' lateral: 10.8 LV SV:         45 LV SV Index:   23 LVOT Area:     2.27 cm  LV Volumes (MOD) LV vol d, MOD A2C:  63.3 ml LV vol d, MOD A4C: 66.3 ml LV vol s, MOD A2C: 34.6 ml LV vol s, MOD A4C: 27.0 ml LV SV MOD A2C:     28.7 ml LV SV MOD A4C:     66.3 ml LV SV MOD BP:      35.1 ml RIGHT VENTRICLE RV S prime:     10.80 cm/s TAPSE (M-mode): 1.5 cm LEFT ATRIUM             Index        RIGHT ATRIUM          Index LA diam:        3.50 cm 1.77 cm/m   RA Area:     7.67 cm LA Vol (A2C):   39.3 ml 19.83 ml/m  RA Volume:   10.70 ml 5.40 ml/m LA Vol (A4C):   56.4 ml 28.46 ml/m LA Biplane Vol: 49.3 ml 24.88 ml/m  AORTIC VALVE LVOT Vmax:   90.40 cm/s LVOT Vmean:  61.700 cm/s LVOT VTI:    0.199 m  AORTA Ao Root diam: 3.20 cm Ao Asc diam:  3.50 cm MITRAL VALVE MV Area (PHT): 3.46 cm    SHUNTS MV Decel Time: 219 msec    Systemic VTI:  0.20 m MR Peak grad: 31.7 mmHg    Systemic Diam: 1.70 cm MR Vmax:      281.50 cm/s MV E velocity: 89.10 cm/s MV A velocity: 99.40 cm/s MV E/A ratio:  0.90 Arvilla Meres MD Electronically  signed by Arvilla Meres MD Signature Date/Time: 09/07/2023/5:56:20 PM    Final    CARDIAC CATHETERIZATION  Result Date: 09/07/2023   Prox RCA to Mid RCA lesion is 30% stenosed.   Ost LAD lesion is 55% stenosed.   Prox LAD to Mid LAD lesion is 100% stenosed.   Prox Cx lesion is 90% stenosed.   Post intervention, there is a 100% residual stenosis.   There is moderate left ventricular systolic dysfunction.   LV end diastolic pressure is moderately elevated.   The left ventricular ejection fraction is 35-45% by visual estimate. Severe 2 vessel obstructive CAD. Occluded proximal LAD with collaterals. High grade proximal LCx Moderate LV dysfunction EF estimated at 35-40% Moderately elevated LVEDP 28 mm Hg Unsuccessful PCI of the LAD due to inability to cross lesion with a wire Plan: will consult CT surgery to consider CABG. I suspect she does have viability in LAD territory given low level troponin leak and lack of significant ST changes. No P2Y12 inhibitor administered during case.   DG Chest 2 View  Result Date:  09/06/2023 CLINICAL DATA:  Chest pain. EXAM: CHEST - 2 VIEW COMPARISON:  February 08, 2018. FINDINGS: The heart size and mediastinal contours are within normal limits. Both lungs are clear. The visualized skeletal structures are unremarkable. IMPRESSION: No active cardiopulmonary disease. Electronically Signed   By: Lupita Raider M.D.   On: 09/06/2023 17:02        Scheduled Meds:  amLODipine  2.5 mg Oral Daily   aspirin EC  81 mg Oral Daily   atorvastatin  80 mg Oral Daily   fluticasone  2 spray Each Nare Daily   furosemide  20 mg Intravenous Once   insulin aspart  0-9 Units Subcutaneous Q4H   insulin aspart  4 Units Subcutaneous TID WC   insulin glargine-yfgn  20 Units Subcutaneous QHS   irbesartan  300 mg Oral Daily   loratadine  10 mg Oral Daily   metoprolol succinate  25 mg Oral Daily   sodium chloride flush  3 mL Intravenous Q12H   spironolactone  25 mg Oral Daily   Continuous Infusions:  sodium chloride     heparin 1,100 Units/hr (09/08/23 0637)     LOS: 2 days    Time spent: 50 minutes spent on chart review, discussion with nursing staff, consultants, updating family and interview/physical exam; more than 50% of that time was spent in counseling and/or coordination of care.    Alvira Philips [place], DO [facility] Available via Epic secure chat 7am-7pm After these hours, please refer to coverage provider listed on [email or web address] 09/08/2023, 9:21 AM

## 2023-09-08 NOTE — Progress Notes (Addendum)
Pt reports she enjoys being active and has ambulated w/o symptoms this morning. Pt is interested in doing CR after surgery at [facility]. She received OHS book, Move in the Tube sheet, OHS careguide, and incentive spirometer (IS baseline: ). Pt was educated on approx length of surgery and stay, importance of ambulation and using IS, restrictions, home needs, and CRPII.   Faustino Congress 09/08/2023 10:12 AM   [id number]

## 2023-09-08 NOTE — Progress Notes (Signed)
ANTICOAGULATION CONSULT NOTE  Pharmacy Consult for heparin Indication: severe 2v CAD - awaiting CABG  Allergies  Allergen Reactions   Azithromycin Diarrhea and Nausea And Vomiting   Indocin [Indomethacin] Other (See Comments)    Light headed and loopy     Patient Measurements: Height: 5\' 4"  (162.6 cm) Weight: 93.5 kg (206 lb 2.1 oz) IBW/kg (Calculated) : 54.7 Heparin Dosing Weight: 79kg  Vital Signs: Temp: 98 F (36.7 C) (09/26 0723) Temp Source: Oral (09/26 0723) BP: 157/73 (09/26 0723) Pulse Rate: 92 (09/26 0723)  Labs: Recent Labs    09/06/23 1526 09/06/23 1710 09/07/23 0202 09/08/23 0231 09/08/23 0751  HGB 14.8  --  12.3 13.2  --   HCT 44.7  --  37.5 38.3  --   PLT 251  --  165 208  --   HEPARINUNFRC  --   --  0.53  --  0.32  CREATININE 0.66  --  0.75 0.80  --   TROPONINIHS 103* 111*  --   --   --     Estimated Creatinine Clearance: 76.7 mL/min (by C-G formula based on SCr of 0.8 mg/dL).   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   Heparin was restarted 2 hours post TR band removal at prior dose. TR band off ~1915 on 9/25. The level at 8 hours after resuming today was 0.34. Suspect it as at lower end of therapeutic range because of recent restart. She has now had 2 therapeutic levels at current dose.   Goal of Therapy:  Heparin level 0.3-0.7 units/ml Monitor platelets by anticoagulation protocol: Yes   Plan:  Continue heparin at 1100 units/hr Daily heparin levels   Cedric Fishman, PharmD, BCPS, BCCCP Clinical Pharmacist

## 2023-09-09 ENCOUNTER — Encounter (HOSPITAL_COMMUNITY): Payer: Self-pay | Admitting: Family Medicine

## 2023-09-09 ENCOUNTER — Inpatient Hospital Stay (HOSPITAL_COMMUNITY): Payer: Medicare Other | Admitting: Certified Registered Nurse Anesthetist

## 2023-09-09 ENCOUNTER — Other Ambulatory Visit: Payer: Self-pay

## 2023-09-09 ENCOUNTER — Inpatient Hospital Stay (HOSPITAL_COMMUNITY): Payer: Medicare Other

## 2023-09-09 ENCOUNTER — Inpatient Hospital Stay (HOSPITAL_COMMUNITY)
Admission: EM | Disposition: A | Discharge status: Home / Self Care | Payer: Self-pay | Source: Home / Self Care | Attending: Surgery

## 2023-09-09 DIAGNOSIS — Z951 Presence of aortocoronary bypass graft: Secondary | ICD-10-CM

## 2023-09-09 DIAGNOSIS — I2510 Atherosclerotic heart disease of native coronary artery without angina pectoris: Secondary | ICD-10-CM

## 2023-09-09 HISTORY — PX: TEE WITHOUT CARDIOVERSION: SHX5443

## 2023-09-09 HISTORY — PX: CORONARY ARTERY BYPASS GRAFT: SHX141

## 2023-09-09 LAB — CBC
HCT: 42.6 % (ref 36.0–46.0)
HCT: 31.9 % — ABNORMAL LOW (ref 36.0–46.0)
HCT: 34.1 % — ABNORMAL LOW (ref 36.0–46.0)
Hemoglobin: 14.3 g/dL (ref 12.0–15.0)
Hemoglobin: 10.8 g/dL — ABNORMAL LOW (ref 12.0–15.0)
Hemoglobin: 11.3 g/dL — ABNORMAL LOW (ref 12.0–15.0)
MCH: 28.9 pg (ref 26.0–34.0)
MCH: 29.4 pg (ref 26.0–34.0)
MCH: 28.8 pg (ref 26.0–34.0)
MCHC: 33.6 g/dL (ref 30.0–36.0)
MCHC: 33.9 g/dL (ref 30.0–36.0)
MCHC: 33.1 g/dL (ref 30.0–36.0)
MCV: 86.1 fL (ref 80.0–100.0)
MCV: 86.9 fL (ref 80.0–100.0)
MCV: 87 fL (ref 80.0–100.0)
Platelets: 220 10*3/uL (ref 150–400)
Platelets: 130 10*3/uL — ABNORMAL LOW (ref 150–400)
Platelets: 135 10*3/uL — ABNORMAL LOW (ref 150–400)
RBC: 4.95 MIL/uL (ref 3.87–5.11)
RBC: 3.67 MIL/uL — ABNORMAL LOW (ref 3.87–5.11)
RBC: 3.92 MIL/uL (ref 3.87–5.11)
RDW: 12.4 % (ref 11.5–15.5)
RDW: 12.5 % (ref 11.5–15.5)
RDW: 12.6 % (ref 11.5–15.5)
WBC: 6.5 10*3/uL (ref 4.0–10.5)
WBC: 10 10*3/uL (ref 4.0–10.5)
WBC: 11.3 10*3/uL — ABNORMAL HIGH (ref 4.0–10.5)
nRBC: 0 % (ref 0.0–0.2)
nRBC: 0 % (ref 0.0–0.2)
nRBC: 0 % (ref 0.0–0.2)

## 2023-09-09 LAB — POCT I-STAT, CHEM 8
BUN: 14 mg/dL (ref 8–23)
BUN: 14 mg/dL (ref 8–23)
BUN: 12 mg/dL (ref 8–23)
BUN: 13 mg/dL (ref 8–23)
Calcium, Ion: 1.21 mmol/L (ref 1.15–1.40)
Calcium, Ion: 1.19 mmol/L (ref 1.15–1.40)
Calcium, Ion: 1.04 mmol/L — ABNORMAL LOW (ref 1.15–1.40)
Calcium, Ion: 1.08 mmol/L — ABNORMAL LOW (ref 1.15–1.40)
Chloride: 102 mmol/L (ref 98–111)
Chloride: 102 mmol/L (ref 98–111)
Chloride: 100 mmol/L (ref 98–111)
Chloride: 102 mmol/L (ref 98–111)
Creatinine, Ser: 0.6 mg/dL (ref 0.44–1.00)
Creatinine, Ser: 0.6 mg/dL (ref 0.44–1.00)
Creatinine, Ser: 0.5 mg/dL (ref 0.44–1.00)
Creatinine, Ser: 0.5 mg/dL (ref 0.44–1.00)
Glucose, Bld: 173 mg/dL — ABNORMAL HIGH (ref 70–99)
Glucose, Bld: 172 mg/dL — ABNORMAL HIGH (ref 70–99)
Glucose, Bld: 112 mg/dL — ABNORMAL HIGH (ref 70–99)
Glucose, Bld: 134 mg/dL — ABNORMAL HIGH (ref 70–99)
HCT: 37 % (ref 36.0–46.0)
HCT: 35 % — ABNORMAL LOW (ref 36.0–46.0)
HCT: 27 % — ABNORMAL LOW (ref 36.0–46.0)
HCT: 29 % — ABNORMAL LOW (ref 36.0–46.0)
Hemoglobin: 12.6 g/dL (ref 12.0–15.0)
Hemoglobin: 11.9 g/dL — ABNORMAL LOW (ref 12.0–15.0)
Hemoglobin: 9.2 g/dL — ABNORMAL LOW (ref 12.0–15.0)
Hemoglobin: 9.9 g/dL — ABNORMAL LOW (ref 12.0–15.0)
Potassium: 3.9 mmol/L (ref 3.5–5.1)
Potassium: 3.7 mmol/L (ref 3.5–5.1)
Potassium: 3.8 mmol/L (ref 3.5–5.1)
Potassium: 4.5 mmol/L (ref 3.5–5.1)
Sodium: 139 mmol/L (ref 135–145)
Sodium: 138 mmol/L (ref 135–145)
Sodium: 136 mmol/L (ref 135–145)
Sodium: 139 mmol/L (ref 135–145)
TCO2: 28 mmol/L (ref 22–32)
TCO2: 23 mmol/L (ref 22–32)
TCO2: 24 mmol/L (ref 22–32)
TCO2: 26 mmol/L (ref 22–32)

## 2023-09-09 LAB — BASIC METABOLIC PANEL
Anion gap: 10 (ref 5–15)
Anion gap: 7 (ref 5–15)
BUN: 14 mg/dL (ref 8–23)
BUN: 11 mg/dL (ref 8–23)
CO2: 24 mmol/L (ref 22–32)
CO2: 23 mmol/L (ref 22–32)
Calcium: 8.7 mg/dL — ABNORMAL LOW (ref 8.9–10.3)
Calcium: 8 mg/dL — ABNORMAL LOW (ref 8.9–10.3)
Chloride: 104 mmol/L (ref 98–111)
Chloride: 108 mmol/L (ref 98–111)
Creatinine, Ser: 0.85 mg/dL (ref 0.44–1.00)
Creatinine, Ser: 0.68 mg/dL (ref 0.44–1.00)
GFR, Estimated: >60 mL/min (ref >60)
GFR, Estimated: >60 mL/min (ref >60)
Glucose, Bld: 164 mg/dL — ABNORMAL HIGH (ref 70–99)
Glucose, Bld: 114 mg/dL — ABNORMAL HIGH (ref 70–99)
Potassium: 3.4 mmol/L — ABNORMAL LOW (ref 3.5–5.1)
Potassium: 4.5 mmol/L (ref 3.5–5.1)
Sodium: 138 mmol/L (ref 135–145)
Sodium: 138 mmol/L (ref 135–145)

## 2023-09-09 LAB — POCT I-STAT 7, (LYTES, BLD GAS, ICA,H+H)
Acid-Base Excess: 0 mmol/L (ref 0.0–2.0)
Acid-base deficit: 3 mmol/L — ABNORMAL HIGH (ref 0.0–2.0)
Acid-base deficit: 3 mmol/L — ABNORMAL HIGH (ref 0.0–2.0)
Acid-base deficit: 5 mmol/L — ABNORMAL HIGH (ref 0.0–2.0)
Bicarbonate: 24.1 mmol/L (ref 20.0–28.0)
Bicarbonate: 23.6 mmol/L (ref 20.0–28.0)
Bicarbonate: 23.2 mmol/L (ref 20.0–28.0)
Bicarbonate: 21.4 mmol/L (ref 20.0–28.0)
Calcium, Ion: 0.94 mmol/L — ABNORMAL LOW (ref 1.15–1.40)
Calcium, Ion: 1.13 mmol/L — ABNORMAL LOW (ref 1.15–1.40)
Calcium, Ion: 1.16 mmol/L (ref 1.15–1.40)
Calcium, Ion: 1.15 mmol/L (ref 1.15–1.40)
HCT: 26 % — ABNORMAL LOW (ref 36.0–46.0)
HCT: 32 % — ABNORMAL LOW (ref 36.0–46.0)
HCT: 32 % — ABNORMAL LOW (ref 36.0–46.0)
HCT: 32 % — ABNORMAL LOW (ref 36.0–46.0)
Hemoglobin: 8.8 g/dL — ABNORMAL LOW (ref 12.0–15.0)
Hemoglobin: 10.9 g/dL — ABNORMAL LOW (ref 12.0–15.0)
Hemoglobin: 10.9 g/dL — ABNORMAL LOW (ref 12.0–15.0)
Hemoglobin: 10.9 g/dL — ABNORMAL LOW (ref 12.0–15.0)
O2 Saturation: 100 %
O2 Saturation: 94 %
O2 Saturation: 94 %
O2 Saturation: 96 %
Patient temperature: 36.4
Patient temperature: 36.4
Patient temperature: 36.8
Potassium: 4.1 mmol/L (ref 3.5–5.1)
Potassium: 3.6 mmol/L (ref 3.5–5.1)
Potassium: 4.8 mmol/L (ref 3.5–5.1)
Potassium: 4.5 mmol/L (ref 3.5–5.1)
Sodium: 136 mmol/L (ref 135–145)
Sodium: 140 mmol/L (ref 135–145)
Sodium: 141 mmol/L (ref 135–145)
Sodium: 140 mmol/L (ref 135–145)
TCO2: 25 mmol/L (ref 22–32)
TCO2: 25 mmol/L (ref 22–32)
TCO2: 25 mmol/L (ref 22–32)
TCO2: 23 mmol/L (ref 22–32)
pCO2 arterial: 34.6 mm[Hg] (ref 32–48)
pCO2 arterial: 44.8 mm[Hg] (ref 32–48)
pCO2 arterial: 44.8 mm[Hg] (ref 32–48)
pCO2 arterial: 43 mm[Hg] (ref 32–48)
pH, Arterial: 7.451 — ABNORMAL HIGH (ref 7.35–7.45)
pH, Arterial: 7.327 — ABNORMAL LOW (ref 7.35–7.45)
pH, Arterial: 7.319 — ABNORMAL LOW (ref 7.35–7.45)
pH, Arterial: 7.304 — ABNORMAL LOW (ref 7.35–7.45)
pO2, Arterial: 327 mm[Hg] — ABNORMAL HIGH (ref 83–108)
pO2, Arterial: 76 mm[Hg] — ABNORMAL LOW (ref 83–108)
pO2, Arterial: 76 mm[Hg] — ABNORMAL LOW (ref 83–108)
pO2, Arterial: 93 mm[Hg] (ref 83–108)

## 2023-09-09 LAB — HEPARIN LEVEL (UNFRACTIONATED): Heparin Unfractionated: 0.29 [IU]/mL — ABNORMAL LOW (ref 0.30–0.70)

## 2023-09-09 LAB — GLUCOSE, CAPILLARY
Glucose-Capillary: 101 mg/dL — ABNORMAL HIGH (ref 70–99)
Glucose-Capillary: 103 mg/dL — ABNORMAL HIGH (ref 70–99)
Glucose-Capillary: 104 mg/dL — ABNORMAL HIGH (ref 70–99)
Glucose-Capillary: 108 mg/dL — ABNORMAL HIGH (ref 70–99)
Glucose-Capillary: 110 mg/dL — ABNORMAL HIGH (ref 70–99)
Glucose-Capillary: 123 mg/dL — ABNORMAL HIGH (ref 70–99)
Glucose-Capillary: 139 mg/dL — ABNORMAL HIGH (ref 70–99)
Glucose-Capillary: 144 mg/dL — ABNORMAL HIGH (ref 70–99)
Glucose-Capillary: 146 mg/dL — ABNORMAL HIGH (ref 70–99)
Glucose-Capillary: 148 mg/dL — ABNORMAL HIGH (ref 70–99)
Glucose-Capillary: 153 mg/dL — ABNORMAL HIGH (ref 70–99)
Glucose-Capillary: 153 mg/dL — ABNORMAL HIGH (ref 70–99)
Glucose-Capillary: 170 mg/dL — ABNORMAL HIGH (ref 70–99)
Glucose-Capillary: 86 mg/dL (ref 70–99)

## 2023-09-09 LAB — POCT I-STAT EG7
Acid-Base Excess: 0 mmol/L (ref 0.0–2.0)
Bicarbonate: 23.9 mmol/L (ref 20.0–28.0)
Calcium, Ion: 0.98 mmol/L — ABNORMAL LOW (ref 1.15–1.40)
HCT: 26 % — ABNORMAL LOW (ref 36.0–46.0)
Hemoglobin: 8.8 g/dL — ABNORMAL LOW (ref 12.0–15.0)
O2 Saturation: 80 %
Potassium: 5 mmol/L (ref 3.5–5.1)
Sodium: 135 mmol/L (ref 135–145)
TCO2: 25 mmol/L (ref 22–32)
pCO2, Ven: 34.2 mm[Hg] — ABNORMAL LOW (ref 44–60)
pH, Ven: 7.452 — ABNORMAL HIGH (ref 7.25–7.43)
pO2, Ven: 42 mm[Hg] (ref 32–45)

## 2023-09-09 LAB — HEMOGLOBIN AND HEMATOCRIT, BLOOD
HCT: 29 % — ABNORMAL LOW (ref 36.0–46.0)
Hemoglobin: 9.9 g/dL — ABNORMAL LOW (ref 12.0–15.0)

## 2023-09-09 LAB — ECHO INTRAOPERATIVE TEE
AV Mean grad: 3 mm[Hg]
AV Peak grad: 6.4 mm[Hg]
Ao pk vel: 1.26 m/s
Height: 64 in
S' Lateral: 3 cm
Weight: 3192.26 [oz_av]

## 2023-09-09 LAB — MAGNESIUM: Magnesium: 2.8 mg/dL — ABNORMAL HIGH (ref 1.7–2.4)

## 2023-09-09 LAB — PROTIME-INR
INR: 1.3 — ABNORMAL HIGH (ref 0.8–1.2)
Prothrombin Time: 16.1 s — ABNORMAL HIGH (ref 11.4–15.2)

## 2023-09-09 LAB — PLATELET COUNT: Platelets: 162 10*3/uL (ref 150–400)

## 2023-09-09 LAB — APTT: aPTT: 27 s (ref 24–36)

## 2023-09-09 LAB — LIPOPROTEIN A (LPA): Lipoprotein (a): 182.2 nmol/L — ABNORMAL HIGH (ref <75.0)

## 2023-09-09 SURGERY — CORONARY ARTERY BYPASS GRAFTING (CABG)
Anesthesia: General | Site: Chest

## 2023-09-09 MED ORDER — PANTOPRAZOLE SODIUM 40 MG PO TBEC
40.0000 mg | DELAYED_RELEASE_TABLET | Freq: Every day | ORAL | Status: DC
  Administered 2023-09-11 – 2023-09-14 (×4): 40 mg via ORAL
  Filled 2023-09-09 (×4): qty 1

## 2023-09-09 MED ORDER — METOPROLOL TARTRATE 12.5 MG HALF TABLET
12.5000 mg | ORAL_TABLET | Freq: Two times a day (BID) | ORAL | Status: DC
  Administered 2023-09-09: 12.5 mg via ORAL
  Filled 2023-09-09: qty 1

## 2023-09-09 MED ORDER — ONDANSETRON HCL 4 MG/2ML IJ SOLN
INTRAMUSCULAR | Status: AC
  Filled 2023-09-09: qty 2

## 2023-09-09 MED ORDER — MAGNESIUM SULFATE 4 GM/100ML IV SOLN
4.0000 g | Freq: Once | INTRAVENOUS | Status: AC
  Administered 2023-09-09: 4 g via INTRAVENOUS
  Filled 2023-09-09: qty 100

## 2023-09-09 MED ORDER — VANCOMYCIN HCL IN DEXTROSE 1-5 GM/200ML-% IV SOLN
1000.0000 mg | Freq: Once | INTRAVENOUS | Status: AC
  Administered 2023-09-09: 1000 mg via INTRAVENOUS
  Filled 2023-09-09: qty 200

## 2023-09-09 MED ORDER — BISACODYL 10 MG RE SUPP
10.0000 mg | Freq: Every day | RECTAL | Status: DC
  Filled 2023-09-09: qty 1

## 2023-09-09 MED ORDER — BISACODYL 5 MG PO TBEC
10.0000 mg | DELAYED_RELEASE_TABLET | Freq: Every day | ORAL | Status: DC
  Administered 2023-09-10 – 2023-09-11 (×2): 10 mg via ORAL
  Filled 2023-09-09 (×4): qty 2

## 2023-09-09 MED ORDER — ALBUMIN HUMAN 5 % IV SOLN: INTRAVENOUS | Status: DC | PRN

## 2023-09-09 MED ORDER — ACETAMINOPHEN 500 MG PO TABS
1000.0000 mg | ORAL_TABLET | Freq: Four times a day (QID) | ORAL | Status: DC
  Administered 2023-09-09 – 2023-09-14 (×15): 1000 mg via ORAL
  Filled 2023-09-09 (×18): qty 2

## 2023-09-09 MED ORDER — PLASMA-LYTE A IV SOLN: INTRAVENOUS | Status: DC | PRN

## 2023-09-09 MED ORDER — DEXMEDETOMIDINE HCL IN NACL 400 MCG/100ML IV SOLN: 0.0000 ug/kg/h | INTRAVENOUS | Status: DC

## 2023-09-09 MED ORDER — FENTANYL CITRATE (PF) 250 MCG/5ML IJ SOLN
INTRAMUSCULAR | Status: AC
  Filled 2023-09-09: qty 5

## 2023-09-09 MED ORDER — FENTANYL CITRATE (PF) 250 MCG/5ML IJ SOLN
INTRAMUSCULAR | Status: DC | PRN
  Administered 2023-09-09 (×2): 100 ug via INTRAVENOUS
  Administered 2023-09-09: 50 ug via INTRAVENOUS
  Administered 2023-09-09: 150 ug via INTRAVENOUS
  Administered 2023-09-09: 100 ug via INTRAVENOUS
  Administered 2023-09-09: 150 ug via INTRAVENOUS
  Administered 2023-09-09: 200 ug via INTRAVENOUS

## 2023-09-09 MED ORDER — PROPOFOL 10 MG/ML IV BOLUS
INTRAVENOUS | Status: DC | PRN
Start: 2023-09-09 — End: 2023-09-09
  Administered 2023-09-09 (×2): 30 mg via INTRAVENOUS

## 2023-09-09 MED ORDER — VASOPRESSIN 20 UNIT/ML IV SOLN
INTRAVENOUS | Status: AC
  Filled 2023-09-09: qty 1

## 2023-09-09 MED ORDER — ROCURONIUM BROMIDE 10 MG/ML (PF) SYRINGE
PREFILLED_SYRINGE | INTRAVENOUS | Status: AC
  Filled 2023-09-09: qty 10

## 2023-09-09 MED ORDER — HEMOSTATIC AGENTS (NO CHARGE) OPTIME
TOPICAL | Status: DC | PRN
Start: 2023-09-09 — End: 2023-09-09
  Administered 2023-09-09 (×2): 1 via TOPICAL

## 2023-09-09 MED ORDER — ASPIRIN 325 MG PO TBEC
325.0000 mg | DELAYED_RELEASE_TABLET | Freq: Every day | ORAL | Status: DC
  Administered 2023-09-10 – 2023-09-13 (×4): 325 mg via ORAL
  Filled 2023-09-09 (×4): qty 1

## 2023-09-09 MED ORDER — DEXAMETHASONE SODIUM PHOSPHATE 10 MG/ML IJ SOLN
INTRAMUSCULAR | Status: DC | PRN
  Administered 2023-09-09: 5 mg via INTRAVENOUS

## 2023-09-09 MED ORDER — PROTAMINE SULFATE 10 MG/ML IV SOLN
INTRAVENOUS | Status: AC
  Filled 2023-09-09: qty 25

## 2023-09-09 MED ORDER — LACTATED RINGERS IV SOLN: INTRAVENOUS | Status: DC | PRN

## 2023-09-09 MED ORDER — LACTATED RINGERS IV SOLN: INTRAVENOUS | Status: DC

## 2023-09-09 MED ORDER — INSULIN REGULAR(HUMAN) IN NACL 100-0.9 UT/100ML-% IV SOLN: INTRAVENOUS | Status: DC

## 2023-09-09 MED ORDER — OXYCODONE HCL 5 MG PO TABS
5.0000 mg | ORAL_TABLET | ORAL | Status: DC | PRN
  Administered 2023-09-09: 5 mg via ORAL
  Administered 2023-09-10 (×2): 10 mg via ORAL
  Filled 2023-09-09: qty 1
  Filled 2023-09-09 (×2): qty 2

## 2023-09-09 MED ORDER — SODIUM CHLORIDE 0.9% FLUSH: 3.0000 mL | INTRAVENOUS | Status: DC | PRN

## 2023-09-09 MED ORDER — METOCLOPRAMIDE HCL 5 MG/ML IJ SOLN
10.0000 mg | Freq: Four times a day (QID) | INTRAMUSCULAR | Status: AC
  Administered 2023-09-09 – 2023-09-10 (×6): 10 mg via INTRAVENOUS
  Filled 2023-09-09 (×6): qty 2

## 2023-09-09 MED ORDER — CEFAZOLIN SODIUM-DEXTROSE 2-4 GM/100ML-% IV SOLN
2.0000 g | Freq: Three times a day (TID) | INTRAVENOUS | Status: AC
  Administered 2023-09-09 – 2023-09-11 (×6): 2 g via INTRAVENOUS
  Filled 2023-09-09 (×6): qty 100

## 2023-09-09 MED ORDER — METOPROLOL TARTRATE 5 MG/5ML IV SOLN
2.5000 mg | INTRAVENOUS | Status: DC | PRN
  Administered 2023-09-09 – 2023-09-10 (×4): 5 mg via INTRAVENOUS
  Filled 2023-09-09 (×4): qty 5

## 2023-09-09 MED ORDER — POTASSIUM CHLORIDE 10 MEQ/50ML IV SOLN
10.0000 meq | INTRAVENOUS | Status: AC
  Administered 2023-09-09 (×3): 10 meq via INTRAVENOUS

## 2023-09-09 MED ORDER — SODIUM CHLORIDE 0.9 % IV SOLN: 250.0000 mL | INTRAVENOUS | Status: DC

## 2023-09-09 MED ORDER — MIDAZOLAM HCL (PF) 5 MG/ML IJ SOLN
INTRAMUSCULAR | Status: DC | PRN
  Administered 2023-09-09 (×3): 2 mg via INTRAVENOUS
  Administered 2023-09-09: 1 mg via INTRAVENOUS

## 2023-09-09 MED ORDER — METOPROLOL TARTRATE 25 MG/10 ML ORAL SUSPENSION: 12.5000 mg | Freq: Two times a day (BID) | ORAL | Status: DC

## 2023-09-09 MED ORDER — 0.9 % SODIUM CHLORIDE (POUR BTL) OPTIME
TOPICAL | Status: DC | PRN
  Administered 2023-09-09: 5000 mL

## 2023-09-09 MED ORDER — MIDAZOLAM HCL (PF) 10 MG/2ML IJ SOLN
INTRAMUSCULAR | Status: AC
  Filled 2023-09-09: qty 2

## 2023-09-09 MED ORDER — ASPIRIN 81 MG PO CHEW
324.0000 mg | CHEWABLE_TABLET | Freq: Every day | ORAL | Status: DC
  Administered 2023-09-14: 324 mg
  Filled 2023-09-09 (×3): qty 4

## 2023-09-09 MED ORDER — CHLORHEXIDINE GLUCONATE CLOTH 2 % EX PADS
6.0000 | MEDICATED_PAD | Freq: Every day | CUTANEOUS | Status: DC
  Administered 2023-09-09 – 2023-09-11 (×3): 6 via TOPICAL

## 2023-09-09 MED ORDER — ALBUMIN HUMAN 5 % IV SOLN
250.0000 mL | INTRAVENOUS | Status: DC | PRN
  Administered 2023-09-09 (×3): 12.5 g via INTRAVENOUS
  Filled 2023-09-09: qty 250

## 2023-09-09 MED ORDER — ONDANSETRON HCL 4 MG/2ML IJ SOLN
4.0000 mg | Freq: Four times a day (QID) | INTRAMUSCULAR | Status: DC | PRN
  Filled 2023-09-09 (×2): qty 2

## 2023-09-09 MED ORDER — CHLORHEXIDINE GLUCONATE 0.12 % MT SOLN
15.0000 mL | OROMUCOSAL | Status: AC
  Administered 2023-09-09: 15 mL via OROMUCOSAL
  Filled 2023-09-09: qty 15

## 2023-09-09 MED ORDER — ARTIFICIAL TEARS OPHTHALMIC OINT
TOPICAL_OINTMENT | OPHTHALMIC | Status: AC
  Filled 2023-09-09: qty 3.5

## 2023-09-09 MED ORDER — THROMBIN (RECOMBINANT) 20000 UNITS EX SOLR
CUTANEOUS | Status: AC
  Filled 2023-09-09: qty 20000

## 2023-09-09 MED ORDER — ASPIRIN 81 MG PO CHEW
324.0000 mg | CHEWABLE_TABLET | Freq: Once | ORAL | Status: AC
  Administered 2023-09-09: 324 mg via ORAL

## 2023-09-09 MED ORDER — ONDANSETRON HCL 4 MG/2ML IJ SOLN
INTRAMUSCULAR | Status: DC | PRN
  Administered 2023-09-09: 4 mg via INTRAVENOUS

## 2023-09-09 MED ORDER — SODIUM CHLORIDE 0.9 % IV SOLN: INTRAVENOUS | Status: DC

## 2023-09-09 MED ORDER — PHENYLEPHRINE 80 MCG/ML (10ML) SYRINGE FOR IV PUSH (FOR BLOOD PRESSURE SUPPORT)
PREFILLED_SYRINGE | INTRAVENOUS | Status: DC | PRN
  Administered 2023-09-09: 80 ug via INTRAVENOUS

## 2023-09-09 MED ORDER — MORPHINE SULFATE (PF) 2 MG/ML IV SOLN
1.0000 mg | INTRAVENOUS | Status: DC | PRN
  Administered 2023-09-09: 4 mg via INTRAVENOUS
  Administered 2023-09-09: 2 mg via INTRAVENOUS
  Administered 2023-09-09 – 2023-09-10 (×2): 4 mg via INTRAVENOUS
  Filled 2023-09-09 (×2): qty 2
  Filled 2023-09-09: qty 1
  Filled 2023-09-09 (×2): qty 2

## 2023-09-09 MED ORDER — DEXAMETHASONE SODIUM PHOSPHATE 10 MG/ML IJ SOLN
INTRAMUSCULAR | Status: AC
  Filled 2023-09-09: qty 1

## 2023-09-09 MED ORDER — THROMBIN 20000 UNITS EX SOLR: CUTANEOUS | Status: DC | PRN

## 2023-09-09 MED ORDER — OXYBUTYNIN CHLORIDE ER 10 MG PO TB24
10.0000 mg | ORAL_TABLET | Freq: Every day | ORAL | Status: DC
  Administered 2023-09-10 – 2023-09-14 (×5): 10 mg via ORAL
  Filled 2023-09-09 (×5): qty 1

## 2023-09-09 MED ORDER — BUDESONIDE 0.25 MG/2ML IN SUSP
0.2500 mg | Freq: Two times a day (BID) | RESPIRATORY_TRACT | Status: DC
  Administered 2023-09-09 – 2023-09-14 (×10): 0.25 mg via RESPIRATORY_TRACT
  Filled 2023-09-09 (×10): qty 2

## 2023-09-09 MED ORDER — SODIUM CHLORIDE 0.9% FLUSH
3.0000 mL | Freq: Two times a day (BID) | INTRAVENOUS | Status: DC
  Administered 2023-09-10 (×2): 3 mL via INTRAVENOUS

## 2023-09-09 MED ORDER — LIDOCAINE 2% (20 MG/ML) 5 ML SYRINGE
INTRAMUSCULAR | Status: DC | PRN
  Administered 2023-09-09: 60 mg via INTRAVENOUS

## 2023-09-09 MED ORDER — PANTOPRAZOLE SODIUM 40 MG IV SOLR
40.0000 mg | Freq: Every day | INTRAVENOUS | Status: AC
  Administered 2023-09-09 – 2023-09-10 (×2): 40 mg via INTRAVENOUS
  Filled 2023-09-09 (×2): qty 10

## 2023-09-09 MED ORDER — PHENYLEPHRINE HCL-NACL 20-0.9 MG/250ML-% IV SOLN: 0.0000 ug/min | INTRAVENOUS | Status: DC

## 2023-09-09 MED ORDER — ACETAMINOPHEN 160 MG/5ML PO SOLN
650.0000 mg | Freq: Once | ORAL | Status: AC
  Administered 2023-09-09: 650 mg
  Filled 2023-09-09: qty 20.3

## 2023-09-09 MED ORDER — LIDOCAINE 2% (20 MG/ML) 5 ML SYRINGE
INTRAMUSCULAR | Status: AC
  Filled 2023-09-09: qty 5

## 2023-09-09 MED ORDER — SODIUM BICARBONATE 8.4 % IV SOLN
50.0000 meq | INTRAVENOUS | Status: AC
  Administered 2023-09-09: 50 meq via INTRAVENOUS

## 2023-09-09 MED ORDER — DOCUSATE SODIUM 100 MG PO CAPS
200.0000 mg | ORAL_CAPSULE | Freq: Every day | ORAL | Status: DC
  Administered 2023-09-10 – 2023-09-13 (×3): 200 mg via ORAL
  Filled 2023-09-09 (×5): qty 2

## 2023-09-09 MED ORDER — PROTAMINE SULFATE 10 MG/ML IV SOLN
INTRAVENOUS | Status: DC | PRN
  Administered 2023-09-09: 300 mg via INTRAVENOUS

## 2023-09-09 MED ORDER — PROTAMINE SULFATE 10 MG/ML IV SOLN
INTRAVENOUS | Status: AC
  Filled 2023-09-09: qty 5

## 2023-09-09 MED ORDER — THROMBIN 20000 UNITS EX SOLR: OROMUCOSAL | Status: DC | PRN

## 2023-09-09 MED ORDER — PROPOFOL 10 MG/ML IV BOLUS
INTRAVENOUS | Status: AC
  Filled 2023-09-09: qty 20

## 2023-09-09 MED ORDER — SUCCINYLCHOLINE CHLORIDE 200 MG/10ML IV SOSY
PREFILLED_SYRINGE | INTRAVENOUS | Status: AC
  Filled 2023-09-09: qty 10

## 2023-09-09 MED ORDER — NITROGLYCERIN IN D5W 200-5 MCG/ML-% IV SOLN
0.0000 ug/min | INTRAVENOUS | Status: DC
  Administered 2023-09-10: 5 ug/min via INTRAVENOUS
  Filled 2023-09-09: qty 250

## 2023-09-09 MED ORDER — SODIUM CHLORIDE 0.45 % IV SOLN: INTRAVENOUS | Status: DC | PRN

## 2023-09-09 MED ORDER — DEXTROSE 50 % IV SOLN: 0.0000 mL | INTRAVENOUS | Status: DC | PRN

## 2023-09-09 MED ORDER — ACETAMINOPHEN 160 MG/5ML PO SOLN: 1000.0000 mg | Freq: Four times a day (QID) | ORAL | Status: DC

## 2023-09-09 MED ORDER — MIDAZOLAM HCL 2 MG/2ML IJ SOLN
2.0000 mg | INTRAMUSCULAR | Status: DC | PRN
  Administered 2023-09-09: 2 mg via INTRAVENOUS
  Filled 2023-09-09: qty 2

## 2023-09-09 MED ORDER — HEPARIN SODIUM (PORCINE) 1000 UNIT/ML IJ SOLN
INTRAMUSCULAR | Status: AC
  Filled 2023-09-09: qty 1

## 2023-09-09 MED ORDER — ROCURONIUM BROMIDE 10 MG/ML (PF) SYRINGE
PREFILLED_SYRINGE | INTRAVENOUS | Status: DC | PRN
  Administered 2023-09-09: 30 mg via INTRAVENOUS
  Administered 2023-09-09: 70 mg via INTRAVENOUS
  Administered 2023-09-09: 30 mg via INTRAVENOUS
  Administered 2023-09-09: 20 mg via INTRAVENOUS
  Administered 2023-09-09: 50 mg via INTRAVENOUS

## 2023-09-09 MED ORDER — SODIUM CHLORIDE 0.9 % IV SOLN: INTRAVENOUS | Status: DC | PRN

## 2023-09-09 MED ORDER — TRAMADOL HCL 50 MG PO TABS: 50.0000 mg | ORAL_TABLET | ORAL | Status: DC | PRN

## 2023-09-09 MED ORDER — HEPARIN SODIUM (PORCINE) 1000 UNIT/ML IJ SOLN
INTRAMUSCULAR | Status: DC | PRN
  Administered 2023-09-09: 30000 [IU] via INTRAVENOUS

## 2023-09-09 SURGICAL SUPPLY — 94 items
ADH SKN CLS APL DERMABOND .7 (GAUZE/BANDAGES/DRESSINGS) ×2
BAG DECANTER FOR FLEXI CONT (MISCELLANEOUS) ×3 IMPLANT
BLADE STERNUM SYSTEM 6 (BLADE) ×3 IMPLANT
BNDG CMPR 5X4 KNIT ELC UNQ LF (GAUZE/BANDAGES/DRESSINGS) ×2
BNDG CMPR 6 X 5 YARDS HK CLSR (GAUZE/BANDAGES/DRESSINGS) ×2
BNDG ELASTIC 4INX 5YD STR LF (GAUZE/BANDAGES/DRESSINGS) IMPLANT
BNDG ELASTIC 6INX 5YD STR LF (GAUZE/BANDAGES/DRESSINGS) IMPLANT
BNDG GAUZE DERMACEA FLUFF 4 (GAUZE/BANDAGES/DRESSINGS) ×3 IMPLANT
BNDG GZE DERMACEA 4 6PLY (GAUZE/BANDAGES/DRESSINGS) ×2
CANISTER SUCT 3000ML PPV (MISCELLANEOUS) ×3 IMPLANT
CANNULA ARTERIAL VENT 3/8 20FR (CANNULA) IMPLANT
CANNULA MC2 2 STG 36/46 NON-V (CANNULA) IMPLANT
CATH ROBINSON RED A/P 18FR (CATHETERS) ×6 IMPLANT
CATH THORACIC 28FR (CATHETERS) ×3 IMPLANT
CATH THORACIC 36FR (CATHETERS) ×3 IMPLANT
CATH THORACIC 36FR RT ANG (CATHETERS) ×3 IMPLANT
CLIP TI WIDE RED SMALL 24 (CLIP) IMPLANT
CONTAINER PROTECT SURGISLUSH (MISCELLANEOUS) ×6 IMPLANT
DERMABOND ADVANCED .7 DNX12 (GAUZE/BANDAGES/DRESSINGS) IMPLANT
DRAPE CARDIOVASCULAR INCISE (DRAPES) ×2
DRAPE SRG 135X102X78XABS (DRAPES) ×3 IMPLANT
DRAPE WARM FLUID 44X44 (DRAPES) ×3 IMPLANT
DRSG COVADERM 4X14 (GAUZE/BANDAGES/DRESSINGS) ×3 IMPLANT
ELECT CAUTERY BLADE 6.4 (BLADE) ×3 IMPLANT
ELECT REM PT RETURN 9FT ADLT (ELECTROSURGICAL) ×4
ELECTRODE REM PT RTRN 9FT ADLT (ELECTROSURGICAL) ×6 IMPLANT
FELT TEFLON 1X6 (MISCELLANEOUS) ×3 IMPLANT
GAUZE SPONGE 4X4 12PLY STRL (GAUZE/BANDAGES/DRESSINGS) ×6 IMPLANT
GLOVE BIO SURGEON STRL SZ 6 (GLOVE) IMPLANT
GLOVE BIO SURGEON STRL SZ 6.5 (GLOVE) IMPLANT
GLOVE BIO SURGEON STRL SZ7.5 (GLOVE) IMPLANT
GLOVE BIOGEL PI IND STRL 6 (GLOVE) IMPLANT
GLOVE BIOGEL PI IND STRL 6.5 (GLOVE) IMPLANT
GLOVE BIOGEL PI IND STRL 7.0 (GLOVE) IMPLANT
GLOVE ECLIPSE 7.0 STRL STRAW (GLOVE) ×6 IMPLANT
GLOVE ORTHO TXT STRL SZ7.5 (GLOVE) IMPLANT
GOWN STRL REUS W/ TWL LRG LVL3 (GOWN DISPOSABLE) ×12 IMPLANT
GOWN STRL REUS W/ TWL XL LVL3 (GOWN DISPOSABLE) ×3 IMPLANT
GOWN STRL REUS W/TWL LRG LVL3 (GOWN DISPOSABLE) ×8
GOWN STRL REUS W/TWL XL LVL3 (GOWN DISPOSABLE) ×2
HEMOSTAT POWDER SURGIFOAM 1G (HEMOSTASIS) ×9 IMPLANT
HEMOSTAT SURGICEL 2X14 (HEMOSTASIS) ×3 IMPLANT
KIT BASIN OR (CUSTOM PROCEDURE TRAY) ×3 IMPLANT
KIT SUCTION CATH 14FR (SUCTIONS) ×3 IMPLANT
KIT TURNOVER KIT B (KITS) ×3 IMPLANT
KIT VASOVIEW HEMOPRO 2 VH 4000 (KITS) ×3 IMPLANT
KNIFE MICRO-UNI 3.5 30 DEG (BLADE) ×3 IMPLANT
NS IRRIG 1000ML POUR BTL (IV SOLUTION) ×15 IMPLANT
PACK E OPEN HEART (SUTURE) ×3 IMPLANT
PACK OPEN HEART (CUSTOM PROCEDURE TRAY) ×3 IMPLANT
PAD ARMBOARD 7.5X6 YLW CONV (MISCELLANEOUS) ×6 IMPLANT
PAD ELECT DEFIB RADIOL ZOLL (MISCELLANEOUS) ×3 IMPLANT
PENCIL BUTTON HOLSTER BLD 10FT (ELECTRODE) ×3 IMPLANT
POSITIONER HEAD DONUT 9IN (MISCELLANEOUS) ×3 IMPLANT
PUNCH AORTIC ROTATE 4.0MM (MISCELLANEOUS) IMPLANT
PUNCH AORTIC ROTATE 4.5MM 8IN (MISCELLANEOUS) ×3 IMPLANT
SEALANT SURG COSEAL 8ML (VASCULAR PRODUCTS) IMPLANT
SENSOR MYOCARDIAL TEMP (MISCELLANEOUS) IMPLANT
SET MPS 3-ND DEL (MISCELLANEOUS) IMPLANT
SPONGE INTESTINAL PEANUT (DISPOSABLE) IMPLANT
SUPPORT HEART JANKE-BARRON (MISCELLANEOUS) ×3 IMPLANT
SUT BONE WAX W31G (SUTURE) ×3 IMPLANT
SUT MNCRL AB 4-0 PS2 18 (SUTURE) IMPLANT
SUT PROLENE 3 0 SH DA (SUTURE) IMPLANT
SUT PROLENE 3 0 SH1 36 (SUTURE) ×3 IMPLANT
SUT PROLENE 4 0 RB 1 (SUTURE) ×2
SUT PROLENE 4 0 SH DA (SUTURE) IMPLANT
SUT PROLENE 4-0 RB1 .5 CRCL 36 (SUTURE) IMPLANT
SUT PROLENE 5 0 C 1 36 (SUTURE) IMPLANT
SUT PROLENE 6 0 C 1 30 (SUTURE) IMPLANT
SUT PROLENE 7 0 BV 1 (SUTURE) IMPLANT
SUT PROLENE 7 0 BV1 MDA (SUTURE) ×3 IMPLANT
SUT PROLENE 8 0 BV175 6 (SUTURE) IMPLANT
SUT SILK 1 MH (SUTURE) IMPLANT
SUT SILK 2 0 SH (SUTURE) IMPLANT
SUT STEEL 6MS V (SUTURE) IMPLANT
SUT STEEL SZ 6 DBL 3X14 BALL (SUTURE) IMPLANT
SUT VIC AB 1 CTX 36 (SUTURE) ×4
SUT VIC AB 1 CTX36XBRD ANBCTR (SUTURE) ×6 IMPLANT
SUT VIC AB 2-0 CT1 27 (SUTURE)
SUT VIC AB 2-0 CT1 TAPERPNT 27 (SUTURE) IMPLANT
SUT VIC AB 2-0 CTX 27 (SUTURE) IMPLANT
SUT VIC AB 3-0 SH 27 (SUTURE)
SUT VIC AB 3-0 SH 27X BRD (SUTURE) IMPLANT
SUT VIC AB 3-0 X1 27 (SUTURE) IMPLANT
SYSTEM SAHARA CHEST DRAIN ATS (WOUND CARE) ×3 IMPLANT
TAPE CLOTH SURG 4X10 WHT LF (GAUZE/BANDAGES/DRESSINGS) IMPLANT
TAPE PAPER 2X10 WHT MICROPORE (GAUZE/BANDAGES/DRESSINGS) IMPLANT
TOWEL GREEN STERILE (TOWEL DISPOSABLE) ×3 IMPLANT
TOWEL GREEN STERILE FF (TOWEL DISPOSABLE) ×3 IMPLANT
TRAY FOLEY SLVR 16FR TEMP STAT (SET/KITS/TRAYS/PACK) ×3 IMPLANT
TUBING LAP HI FLOW INSUFFLATIO (TUBING) ×3 IMPLANT
UNDERPAD 30X36 HEAVY ABSORB (UNDERPADS AND DIAPERS) ×3 IMPLANT
WATER STERILE IRR 1000ML POUR (IV SOLUTION) ×6 IMPLANT

## 2023-09-09 NOTE — Plan of Care (Signed)
  Problem: Education: Goal: Understanding of CV disease, CV risk reduction, and recovery process will improve Outcome: Progressing Goal: Individualized Educational Video(s) Outcome: Progressing   Problem: Activity: Goal: Ability to return to baseline activity level will improve Outcome: Progressing   Problem: Cardiovascular: Goal: Ability to achieve and maintain adequate cardiovascular perfusion will improve Outcome: Progressing Goal: Vascular access site(s) Level 0-1 will be maintained Outcome: Progressing   Problem: Health Behavior/Discharge Planning: Goal: Ability to safely manage health-related needs after discharge will improve Outcome: Progressing   Problem: Education: Goal: Understanding of cardiac disease, CV risk reduction, and recovery process will improve Outcome: Progressing Goal: Individualized Educational Video(s) Outcome: Progressing   Problem: Activity: Goal: Ability to tolerate increased activity will improve Outcome: Progressing   Problem: Cardiac: Goal: Ability to achieve and maintain adequate cardiovascular perfusion will improve Outcome: Progressing   Problem: Health Behavior/Discharge Planning: Goal: Ability to safely manage health-related needs after discharge will improve Outcome: Progressing   Problem: Education: Goal: Ability to describe self-care measures that may prevent or decrease complications (Diabetes Survival Skills Education) will improve Outcome: Progressing Goal: Individualized Educational Video(s) Outcome: Progressing   Problem: Coping: Goal: Ability to adjust to condition or change in health will improve Outcome: Progressing   Problem: Fluid Volume: Goal: Ability to maintain a balanced intake and output will improve Outcome: Progressing   Problem: Health Behavior/Discharge Planning: Goal: Ability to identify and utilize available resources and services will improve Outcome: Progressing Goal: Ability to manage health-related  needs will improve Outcome: Progressing   Problem: Metabolic: Goal: Ability to maintain appropriate glucose levels will improve Outcome: Progressing   Problem: Nutritional: Goal: Maintenance of adequate nutrition will improve Outcome: Progressing Goal: Progress toward achieving an optimal weight will improve Outcome: Progressing   Problem: Skin Integrity: Goal: Risk for impaired skin integrity will decrease Outcome: Progressing   Problem: Tissue Perfusion: Goal: Adequacy of tissue perfusion will improve Outcome: Progressing   Problem: Education: Goal: Knowledge of General Education information will improve Description: Including pain rating scale, medication(s)/side effects and non-pharmacologic comfort measures Outcome: Progressing   Problem: Health Behavior/Discharge Planning: Goal: Ability to manage health-related needs will improve Outcome: Progressing   Problem: Clinical Measurements: Goal: Ability to maintain clinical measurements within normal limits will improve Outcome: Progressing Goal: Will remain free from infection Outcome: Progressing Goal: Diagnostic test results will improve Outcome: Progressing Goal: Respiratory complications will improve Outcome: Progressing Goal: Cardiovascular complication will be avoided Outcome: Progressing   Problem: Activity: Goal: Risk for activity intolerance will decrease Outcome: Progressing   Problem: Nutrition: Goal: Adequate nutrition will be maintained Outcome: Progressing   Problem: Coping: Goal: Level of anxiety will decrease Outcome: Progressing   Problem: Elimination: Goal: Will not experience complications related to bowel motility Outcome: Progressing Goal: Will not experience complications related to urinary retention Outcome: Progressing   Problem: Pain Managment: Goal: General experience of comfort will improve Outcome: Progressing   Problem: Safety: Goal: Ability to remain free from injury will  improve Outcome: Progressing   Problem: Skin Integrity: Goal: Risk for impaired skin integrity will decrease Outcome: Progressing

## 2023-09-09 NOTE — TOC Initial Note (Addendum)
Transition of Care [facility]) - Initial/Assessment Note    Patient Details  Name: Kerry Compton MRN: [id number] Date of Birth: 03-16-1956  Transition of [facility]) CM/SW Contact:    Elliot Cousin, RN Phone Number: [phone] 09/09/2023, 2:40 PM  Clinical Narrative:   Spoke to dtrs. CM contacted dtr, Miranda. Pt lives at home alone. Was independent pta. Explained PT/OT will evaluate and make recommendations, IP rehab vs HH. Will continue to follow as pt progresses.                  Expected Discharge Plan: Home w Home Health Services Barriers to Discharge: Continued Medical Work up   Patient Goals and CMS Choice Patient states their goals for this hospitalization and ongoing recovery are:: wants to recover          Expected Discharge Plan and Services   Discharge Planning Services: CM Consult                                          Prior Living Arrangements/Services   Lives with:: Self Patient language and need for interpreter reviewed:: Yes Do you feel safe going back to the place where you live?: Yes            Criminal Activity/Legal Involvement Pertinent to Current Situation/Hospitalization: No - Comment as needed  Activities of Daily Living   ADL Screening (condition at time of admission) Does the patient have a NEW difficulty with bathing/dressing/toileting/self-feeding that is expected to last >3 days?: No Does the patient have a NEW difficulty with getting in/out of bed, walking, or climbing stairs that is expected to last >3 days?: No Does the patient have a NEW difficulty with communication that is expected to last >3 days?: No Is the patient deaf or have difficulty hearing?: No Does the patient have difficulty seeing, even when wearing glasses/contacts?: No Does the patient have difficulty concentrating, remembering, or making decisions?: No  Permission Sought/Granted Permission sought to share information with : Case Manager,  Family Supports Permission granted to share information with : Yes, Verbal Permission Granted  Share Information with NAME: Tamera Punt Prescod     Permission granted to share info w Relationship: daughter  Permission granted to share info w Contact Information: [phone]  Emotional Assessment Appearance:: Appears stated age Attitude/Demeanor/Rapport: vent Affect (typically observed): Accepting Orientation: : Oriented to Self, Oriented to Place, Oriented to  Time, Oriented to Situation   Psych Involvement: No (comment)  Admission diagnosis:  NSTEMI (non-ST elevated myocardial infarction) (HCC) [I21.4] S/P CABG x 2 [Z95.1] Patient Active Problem List   Diagnosis Date Noted   S/P CABG x 2 09/09/2023   NSTEMI (non-ST elevated myocardial infarction) (HCC) 09/06/2023   Precordial chest pain 05/07/2020   Educated about COVID-19 virus infection 05/07/2020   Asthma 08/28/2015   Type 1 diabetes mellitus (HCC) 03/19/2013   Hyperlipidemia 03/19/2013   Asthma, chronic 03/19/2013   Hypertension 03/19/2013   PCP:  Laurann Montana, MD Pharmacy:   [id number] - [place], [place] - [address] [place] [address] Phone: [phone] Fax: [phone]  [facility] - [place], [place] - [address] [place] [address] Phone: [phone] Fax: [phone]     Social Determinants of Health (SDOH) Social History: SDOH Screenings   Food Insecurity: No Food  Insecurity (09/06/2023)  Housing: Low Risk  (09/06/2023)  Transportation Needs: No Transportation Needs (09/06/2023)  Utilities: Not At Risk (09/06/2023)  Social Connections: Unknown (04/14/2022)   Received from [facility]  Tobacco Use: Low Risk  (09/09/2023)   SDOH Interventions:     Readmission Risk Interventions     No data to display

## 2023-09-09 NOTE — Hospital Course (Signed)
History of Present Illness:    Kerry Compton is a 67 year old female we are asked to see in CT surgical consultation for consideration of coronary artery surgical revascularization.  She has known cardiac risk factors of insulin-dependent diabetes, hypertension and hyperlipidemia.  She presented to the emergency department with chest pain.  The patient said she had been doing well up until a couple days ago when she developed acute onset of substernal chest discomfort and shortness of breath while walking up an incline.  The symptoms resolved at rest but did recur.  She has since continued to experience exertional chest discomfort which resolved with rest.  She presented to the emergency department yesterday and has ruled in for non-STEMI.  EKG showed normal sinus rhythm with anteroseptal infarct and mild isolated T wave inversions in lead aVL.  High-sensitivity troponin I is being trended and so far has peaked at 111. He has had history of a negative exercise treadmill test in June 2021.  Has not been seen by cardiology since that study.  She was admitted and cardiology consultation was obtained.  She was placed on heparin.  Felt to require cardiac catheterization which was done today which revealed severe two-vessel obstructive coronary artery disease with an occluded LAD to mid LAD lesion. A high-grade circumflex lesion of 90% was noted.  There was only minor nonobstructive RCA disease.  PCI was attempted but they were unable to cross the obstructed LAD lesion.  Moderate LV dysfunction with an EF estimated at 35 to 40% was noted.  Additionally there was moderately elevated LVEDP at 28 mmHg.    Hospital Course:  Dr. Laneta Simmers reviewed the patient's diagnostic studies and determined she would benefit from surgery. He reviewed the treatment options as well as the risks and benefits of surgery. Kerry Compton was agreeable to proceed with surgery. She remained in stable condition until she was brought to the  operating room on 09/27. She underwent CABG x 2 utilizing LIMA to LAD and SVG to OM as well as endoscopic harvest of the right greater saphenous vein. She tolerated the procedure well and was transferred to the SICU in stable condition.  Postoperative hospital course:  She has remained hemodynamically stable.  She was weaned from the ventilator using standard post cardiac surgical protocols without difficulty.  She has remained neurologically intact.  She did initially require nitroglycerin for blood pressure control but this was weaned without difficulty.  She has been started on metoprolol and Norvasc has also been continued.  All routine lines, monitors and drainage devices have been discontinued in the standard fashion.  Epicardial pacing wires have been removed.  She does show some expected postoperative increased volume and has been diuresed accordingly.  She has mild expected acute blood loss anemia which is being monitored clinically.  He has been given Lovenox for DVT prophylaxis.  She maintained sinus rhythm.  She has been started on statin.

## 2023-09-09 NOTE — Anesthesia Procedure Notes (Signed)
Arterial Line Insertion Start/End9/27/2024 7:00 AM, 09/09/2023 7:05 AM Performed by: Nils Pyle, CRNA, CRNA  Patient location: Pre-op. Preanesthetic checklist: patient identified, IV checked, site marked, risks and benefits discussed, surgical consent, monitors and equipment checked, pre-op evaluation, timeout performed and anesthesia consent Lidocaine 1% used for infiltration Left, radial was placed Catheter size: 20 G Hand hygiene performed  and maximum sterile barriers used   Attempts: 1 Procedure performed without using ultrasound guided technique. Following insertion, dressing applied and Biopatch. Post procedure assessment: normal and unchanged  Patient tolerated the procedure well with no immediate complications.

## 2023-09-09 NOTE — Brief Op Note (Signed)
09/06/2023 - 09/09/2023  12:19 PM  PATIENT:  Kerry Compton  67 y.o. female  PRE-OPERATIVE DIAGNOSIS:  CORONARY ARTERY DISEASE  POST-OPERATIVE DIAGNOSIS:  CORONARY ARTERY DISEASE  PROCEDURE:   CORONARY ARTERY BYPASS GRAFTING (CABG) TIMES TWO UTILIZING LEFT INTERNAL MAMMARY ARTERY AND ENDOSCOPIC VEIN HARVEST RIGHT GREATER SAPHENOUS VEIN  TRANSESOPHAGEAL ECHOCARDIOGRAM  Vein harvest time: Vein prep time: -LIMA to LAD -SVG to OM  SURGEON:  Surgeons and Role:    * Alleen Borne, MD - Primary  PHYSICIAN ASSISTANT: Aloha Gell PA-C  ASSISTANTS: Valene Bors, RNFA student   ANESTHESIA:   general  EBL:  400 mL   BLOOD ADMINISTERED:none  DRAINS:  Mediastinal drains    LOCAL MEDICATIONS USED:  NONE  SPECIMEN:  No Specimen  DISPOSITION OF SPECIMEN:  N/A  COUNTS:  NO 7-0, no need for xray  DICTATION: .Dragon Dictation  PLAN OF CARE: Admit to inpatient   PATIENT DISPOSITION:  ICU - intubated and hemodynamically stable.   Delay start of Pharmacological VTE agent (>24hrs) due to surgical blood loss or risk of bleeding: yes

## 2023-09-09 NOTE — Op Note (Addendum)
CARDIOVASCULAR SURGERY OPERATIVE NOTE  09/09/2023  Surgeon:  Alleen Borne, MD  First Assistant: Aloha Gell,  PA-C:   An experienced assistant was required given the complexity of this surgery and the standard of surgical care. The assistant was needed for endoscopic vein harvest, exposure, dissection, suctioning, retraction of delicate tissues and sutures, instrument exchange and for overall help during this procedure.   Preoperative Diagnosis:  Severe multi-vessel coronary artery disease   Postoperative Diagnosis:  Same   Procedure:  Median Sternotomy Extracorporeal circulation 3.   Coronary artery bypass grafting x 2  Left internal mammary artery graft to the LAD SVG to OM   4.   Endoscopic vein harvest from the right leg   Anesthesia:  General Endotracheal   Clinical History/Surgical Indication:  This 67 year old woman with hypertension and hyperlipidemia as well as insulin-dependent diabetes reports being in her usual state of health until last weekend when she had an episode of chest discomfort and shortness of breath walking up an incline.  She said she had never had that before.  Then on Monday she had recurrent chest discomfort and shortness of breath while walking upstairs going to jury duty.  She said she felt like she may not make it.  On presentation her troponin was 103 and 111.  ECG showed anterior septal infarct and some mild T wave inversions in aVL.  This x-ray was unremarkable.  Cardiac catheterization yesterday showed severe two-vessel coronary disease with an occluded proximal LAD with slow flow into the mid to distal vessel by collaterals.  Left circumflex had 90% proximal stenosis of the large vessel.  The RCA had mild nonobstructive disease.  Left ventricular ejection fraction was estimated at 35 to 40% with an LVEDP of 28.  Attempt to cross the LAD occlusion was  unsuccessful.  She subsequently had a 2D echo yesterday which showed an ejection fraction of 50 to 55% with hypokinesis of the anterior wall, septum, and mid to distal inferior wall.  No significant valvular abnormality.  She has had no further chest discomfort.  I agree that coronary bypass graft surgery to the LAD and left circumflex systems is the best treatment to prevent further ischemia and infarction and resolve her symptoms. I discussed the operative procedure with the patient and family including alternatives, benefits and risks; including but not limited to bleeding, blood transfusion, infection, stroke, myocardial infarction, graft failure, heart block requiring a permanent pacemaker, organ dysfunction, and death.  Jessica Priest understands and agrees to proceed.   Preparation:  The patient was seen in the preoperative holding area and the correct patient, correct operation were confirmed with the patient after reviewing the medical record and catheterization. The consent was signed by me. Preoperative antibiotics were given. A pulmonary arterial line and radial arterial line were placed by the anesthesia team. The patient was taken back to the operating room and positioned supine on the operating room table. After being placed under general endotracheal anesthesia by the anesthesia team a foley catheter was placed. The neck, chest, abdomen, and both legs were prepped with betadine soap and solution and draped in the usual sterile manner. A surgical time-out was taken and the correct patient and operative procedure were confirmed with the nursing and anesthesia staff.   Cardiopulmonary Bypass:  A median sternotomy was performed. The pericardium was opened in the midline. Right ventricular function appeared normal. The ascending aorta was of normal size and had no palpable plaque. There were no contraindications to aortic cannulation  or cross-clamping. The patient was fully systemically  heparinized and the ACT was maintained > 400 sec. The proximal aortic arch was cannulated with a 20 F aortic cannula for arterial inflow. Venous cannulation was performed via the right atrial appendage using a two-staged venous cannula. An antegrade cardioplegia/vent cannula was inserted into the mid-ascending aorta. Aortic occlusion was performed with a single cross-clamp. Systemic cooling to 32 degrees Centigrade and topical cooling of the heart with iced saline were used. Hyperkalemic antegrade cold blood cardioplegia was used to induce diastolic arrest and was then given at about 20 minute intervals throughout the period of arrest to maintain myocardial temperature at or below 10 degrees centigrade. A temperature probe was inserted into the interventricular septum and an insulating pad was placed in the pericardium.   Left internal mammary artery harvest:  The left side of the sternum was retracted using the Rultract retractor. The left internal mammary artery was harvested as a pedicle graft. All side branches were clipped. It was a medium-sized vessel of good quality with excellent blood flow. It was ligated distally and divided. It was sprayed with topical papaverine solution to prevent vasospasm.   Endoscopic vein harvest: performed by Aloha Gell, PA-C  The right greater saphenous vein was harvested endoscopically through a 2 cm incision medial to the right knee. It was harvested from the thigh. It was a medium-sized vein of good quality. The side branches were all ligated with 4-0 silk ties.    Coronary arteries:  The coronary arteries were examined.  LAD:  medium caliber vessel with no distal disease LCX:  medium caliber OM branch with segmental distal disease.    Grafts: all bypasses performed by me and assisted by Aloha Gell, PA-C.  LIMA to the LAD: 1.75 mm. It was sewn end to side using 8-0 prolene continuous suture. SVG to OM:  1.6 mm. It was sewn end to side using 7-0  prolene continuous suture.  The proximal vein graft anastomosis was performed to the mid-ascending aorta using continuous 6-0 prolene suture. A graft marker was placed around the proximal anastomosis.   Completion:  The patient was rewarmed to 37 degrees Centigrade. The clamp was removed from the LIMA pedicle and there was rapid warming of the septum and return of sinus rhythm. The crossclamp was removed with a time of 48 minutes. There was spontaneous return of sinus rhythm. The distal and proximal anastomoses were checked for hemostasis. The position of the grafts was satisfactory. Two temporary epicardial pacing wires were placed on the right atrium and two on the right ventricle. The patient was weaned from CPB without difficulty on no inotropes. CPB time was 63 minutes. Cardiac output was 5 LPM. TEE showed normal LV systolic function. Heparin was fully reversed with protamine and the aortic and venous cannulas removed. Hemostasis was achieved. Mediastinal and left pleural drainage tubes were placed. The sternum was closed with #6 stainless steel wires. The fascia was closed with continuous # 1 vicryl suture. The subcutaneous tissue was closed with 2-0 vicryl continuous suture. The skin was closed with 3-0 vicryl subcuticular suture. All sponge, needle, and instrument counts were reported correct at the end of the case. Dry sterile dressings were placed over the incisions and around the chest tubes which were connected to pleurevac suction. The patient was then transported to the surgical intensive care unit in stable condition.

## 2023-09-09 NOTE — Discharge Instructions (Signed)

## 2023-09-09 NOTE — Anesthesia Procedure Notes (Signed)
Central Venous Catheter Insertion Performed by: Marcene Duos, MD, anesthesiologist Start/End9/27/2024 6:45 AM, 09/09/2023 7:00 AM Patient location: Pre-op. Preanesthetic checklist: patient identified, IV checked, site marked, risks and benefits discussed, surgical consent, monitors and equipment checked, pre-op evaluation, timeout performed and anesthesia consent Hand hygiene performed  and maximum sterile barriers used  PA cath was placed.Swan type:thermodilution PA Cath depth:42 Procedure performed without using ultrasound guided technique. Attempts: 1 Patient tolerated the procedure well with no immediate complications.

## 2023-09-09 NOTE — Anesthesia Procedure Notes (Addendum)
Central Venous Catheter Insertion Performed by: Marcene Duos, MD, anesthesiologist Start/End9/27/2024 6:45 AM, 09/09/2023 7:00 AM Patient location: Pre-op. Preanesthetic checklist: patient identified, IV checked, site marked, risks and benefits discussed, surgical consent, monitors and equipment checked, pre-op evaluation, timeout performed and anesthesia consent Position: Trendelenburg Lidocaine 1% used for infiltration and patient sedated Hand hygiene performed , maximum sterile barriers used  and Seldinger technique used Catheter size: 8.5 Fr Total catheter length 10. Central line was placed.Sheath introducer Swan type:thermodilution PA Cath depth:42 Procedure performed using ultrasound guided technique. Ultrasound Notes:anatomy identified, needle tip was noted to be adjacent to the nerve/plexus identified, no ultrasound evidence of intravascular and/or intraneural injection and image(s) printed for medical record Attempts: 1 Following insertion, line sutured, dressing applied and Biopatch. Post procedure assessment: blood return through all ports, free fluid flow and no air  Patient tolerated the procedure well with no immediate complications.

## 2023-09-09 NOTE — Transfer of Care (Signed)
Immediate Anesthesia Transfer of Care Note  Patient: Kerry Compton  Procedure(s) Performed: CORONARY ARTERY BYPASS GRAFTING (CABG) TIMES TWO UTILIZING LEFT INTERNAL MAMMARY ARTERY AND ENDOSCOPIC VEIN HARVEST RIGHT GREATER SAPHENOUS VEIN (Chest) TRANSESOPHAGEAL ECHOCARDIOGRAM  Patient Location: ICU  Anesthesia Type:General  Level of Consciousness: sedated and Patient remains intubated per anesthesia plan  Airway & Oxygen Therapy: Patient remains intubated per anesthesia plan and Patient placed on Ventilator (see vital sign flow sheet for setting)  Post-op Assessment: Report given to RN and Post -op Vital signs reviewed and stable  Post vital signs: Reviewed and stable  Last Vitals:  Vitals Value Taken Time  BP 96/62 09/09/23 1228  Temp 36.5 C 09/09/23 1232  Pulse 75 09/09/23 1232  Resp 16 09/09/23 1232  SpO2 94 % 09/09/23 1232  Vitals shown include unfiled device data.  Last Pain:  Vitals:   09/09/23 0431  TempSrc: Oral  PainSc: 0-No pain         Complications: No notable events documented.

## 2023-09-09 NOTE — Procedures (Signed)
Extubation Procedure Note  Patient Details:   Name: Kerry Compton DOB: [birth year] MRN: [id number]   Airway Documentation:    Vent end date: 09/09/23 Vent end time: 1800   Evaluation  O2 sats: stable throughout Complications: No apparent complications Patient did tolerate procedure well. Bilateral Breath Sounds: Clear, Diminished   Yes  Pt extubated to 2L [place], pt tolerated well. Cuff leak present, no stridor noted, RN at bedside, RT will monitor as needed.   Thornell Mule 09/09/2023, 6:14 PM

## 2023-09-09 NOTE — Anesthesia Procedure Notes (Signed)
Procedure Name: Intubation Date/Time: 09/09/2023 7:52 AM  Performed by: Nils Pyle, CRNAPre-anesthesia Checklist: Patient identified, Emergency Drugs available, Suction available and Patient being monitored Patient Re-evaluated:Patient Re-evaluated prior to induction Oxygen Delivery Method: Circle System Utilized Preoxygenation: Pre-oxygenation with 100% oxygen Induction Type: IV induction Ventilation: Mask ventilation without difficulty and Oral airway inserted - appropriate to patient size Laryngoscope Size: Hyacinth Meeker and 2 Grade View: Grade I Tube type: Oral Tube size: 8.0 mm Number of attempts: 1 Airway Equipment and Method: Stylet and Oral airway Placement Confirmation: ETT inserted through vocal cords under direct vision, positive ETCO2 and breath sounds checked- equal and bilateral Secured at: 22 cm Tube secured with: Tape Dental Injury: Teeth and Oropharynx as per pre-operative assessment

## 2023-09-09 NOTE — Anesthesia Preprocedure Evaluation (Signed)
Anesthesia Evaluation  Patient identified by MRN, date of birth, ID band Patient awake    Reviewed: Allergy & Precautions, NPO status , Patient's Chart, lab work & pertinent test results  Airway Mallampati: II  TM Distance: >3 FB Neck ROM: Full    Dental  (+) Dental Advisory Given   Pulmonary asthma    breath sounds clear to auscultation       Cardiovascular hypertension, Pt. on medications + CAD and + Past MI   Rhythm:Regular Rate:Normal     Neuro/Psych negative neurological ROS     GI/Hepatic negative GI ROS,,,(+) Hepatitis -  Endo/Other  diabetes, Type 2    Renal/GU negative Renal ROS     Musculoskeletal   Abdominal   Peds  Hematology negative hematology ROS (+)   Anesthesia Other Findings   Reproductive/Obstetrics                             Anesthesia Physical Anesthesia Plan  ASA: 4  Anesthesia Plan: General   Post-op Pain Management:    Induction: Intravenous  PONV Risk Score and Plan: 3 and Dexamethasone, Ondansetron and Treatment may vary due to age or medical condition  Airway Management Planned: Oral ETT  Additional Equipment: Arterial line, CVP, PA Cath, TEE and Ultrasound Guidance Line Placement  Intra-op Plan:   Post-operative Plan: Post-operative intubation/ventilation  Informed Consent: I have reviewed the patients History and Physical, chart, labs and discussed the procedure including the risks, benefits and alternatives for the proposed anesthesia with the patient or authorized representative who has indicated his/her understanding and acceptance.     Dental advisory given  Plan Discussed with: CRNA  Anesthesia Plan Comments:        Anesthesia Quick Evaluation

## 2023-09-09 NOTE — Anesthesia Postprocedure Evaluation (Signed)
Anesthesia Post Note  Patient: KJERSTEN ORMISTON  Procedure(s) Performed: CORONARY ARTERY BYPASS GRAFTING (CABG) TIMES TWO UTILIZING LEFT INTERNAL MAMMARY ARTERY AND ENDOSCOPIC VEIN HARVEST RIGHT GREATER SAPHENOUS VEIN (Chest) TRANSESOPHAGEAL ECHOCARDIOGRAM     Patient location during evaluation: SICU Anesthesia Type: General Level of consciousness: sedated Pain management: pain level controlled Vital Signs Assessment: post-procedure vital signs reviewed and stable Respiratory status: patient remains intubated per anesthesia plan Cardiovascular status: stable Postop Assessment: no apparent nausea or vomiting Anesthetic complications: no  No notable events documented.  Last Vitals:  Vitals:   09/09/23 1530 09/09/23 1555  BP:  (!) 115/52  Pulse: 77   Resp: 20   Temp: (!) 35.8 C   SpO2: 97%     Last Pain:  Vitals:   09/09/23 0431  TempSrc: Oral  PainSc: 0-No pain                 Kennieth Rad

## 2023-09-09 NOTE — Progress Notes (Addendum)
Pt achieved a NIF of -30 and VC of 1.25 with great pt effort on all attempts. RN at bedside, RT will monitor as needed.

## 2023-09-09 NOTE — Interval H&P Note (Signed)
History and Physical Interval Note:  09/09/2023 6:39 AM  Kerry Compton  has presented today for surgery, with the diagnosis of CAD.  The various methods of treatment have been discussed with the patient and family. After consideration of risks, benefits and other options for treatment, the patient has consented to  Procedure(s): CORONARY ARTERY BYPASS GRAFTING (CABG) (N/A) TRANSESOPHAGEAL ECHOCARDIOGRAM (N/A) as a surgical intervention.  The patient's history has been reviewed, patient examined, no change in status, stable for surgery.  I have reviewed the patient's chart and labs.  Questions were answered to the patient's satisfaction.     Alleen Borne

## 2023-09-09 NOTE — Progress Notes (Signed)
      [address]       [place] [address]             [phone]       Intubated, starting to wake up  BP (!) 115/52   Pulse 76   Temp (!) 96.6 F (35.9 C)   Resp 17   Ht 5\' 4"  (1.626 m)   Wt 90.5 kg   SpO2 98%   BMI 34.25 kg/m  23/11 CI 1.99   Intake/Output Summary (Last 24 hours) at 09/09/2023 1656 Last data filed at 09/09/2023 1600 Gross per 24 hour  Intake 4740.83 ml  Output 1929 ml  Net 2811.83 ml   Minimal CT output  Hct 31  Doing well early postop  Viviann Spare C. Dorris Fetch, MD [facility] [phone]

## 2023-09-10 ENCOUNTER — Inpatient Hospital Stay (HOSPITAL_COMMUNITY): Payer: Medicare Other

## 2023-09-10 ENCOUNTER — Encounter (HOSPITAL_COMMUNITY): Payer: Self-pay | Admitting: Surgery

## 2023-09-10 LAB — BASIC METABOLIC PANEL
Anion gap: 7 (ref 5–15)
Anion gap: 9 (ref 5–15)
BUN: 14 mg/dL (ref 8–23)
BUN: 17 mg/dL (ref 8–23)
CO2: 24 mmol/L (ref 22–32)
CO2: 23 mmol/L (ref 22–32)
Calcium: 8.2 mg/dL — ABNORMAL LOW (ref 8.9–10.3)
Calcium: 8.4 mg/dL — ABNORMAL LOW (ref 8.9–10.3)
Chloride: 106 mmol/L (ref 98–111)
Chloride: 102 mmol/L (ref 98–111)
Creatinine, Ser: 0.69 mg/dL (ref 0.44–1.00)
Creatinine, Ser: 0.84 mg/dL (ref 0.44–1.00)
GFR, Estimated: >60 mL/min (ref >60)
GFR, Estimated: >60 mL/min (ref >60)
Glucose, Bld: 127 mg/dL — ABNORMAL HIGH (ref 70–99)
Glucose, Bld: 173 mg/dL — ABNORMAL HIGH (ref 70–99)
Potassium: 4.6 mmol/L (ref 3.5–5.1)
Potassium: 4.5 mmol/L (ref 3.5–5.1)
Sodium: 137 mmol/L (ref 135–145)
Sodium: 134 mmol/L — ABNORMAL LOW (ref 135–145)

## 2023-09-10 LAB — CBC
HCT: 34.7 % — ABNORMAL LOW (ref 36.0–46.0)
HCT: 33.6 % — ABNORMAL LOW (ref 36.0–46.0)
Hemoglobin: 11.3 g/dL — ABNORMAL LOW (ref 12.0–15.0)
Hemoglobin: 11 g/dL — ABNORMAL LOW (ref 12.0–15.0)
MCH: 28.7 pg (ref 26.0–34.0)
MCH: 29 pg (ref 26.0–34.0)
MCHC: 32.6 g/dL (ref 30.0–36.0)
MCHC: 32.7 g/dL (ref 30.0–36.0)
MCV: 88.1 fL (ref 80.0–100.0)
MCV: 88.7 fL (ref 80.0–100.0)
Platelets: 191 10*3/uL (ref 150–400)
Platelets: 161 10*3/uL (ref 150–400)
RBC: 3.94 MIL/uL (ref 3.87–5.11)
RBC: 3.79 MIL/uL — ABNORMAL LOW (ref 3.87–5.11)
RDW: 12.7 % (ref 11.5–15.5)
RDW: 13.1 % (ref 11.5–15.5)
WBC: 15.6 10*3/uL — ABNORMAL HIGH (ref 4.0–10.5)
WBC: 13.6 10*3/uL — ABNORMAL HIGH (ref 4.0–10.5)
nRBC: 0 % (ref 0.0–0.2)
nRBC: 0 % (ref 0.0–0.2)

## 2023-09-10 LAB — GLUCOSE, CAPILLARY
Glucose-Capillary: 124 mg/dL — ABNORMAL HIGH (ref 70–99)
Glucose-Capillary: 134 mg/dL — ABNORMAL HIGH (ref 70–99)
Glucose-Capillary: 138 mg/dL — ABNORMAL HIGH (ref 70–99)
Glucose-Capillary: 143 mg/dL — ABNORMAL HIGH (ref 70–99)
Glucose-Capillary: 144 mg/dL — ABNORMAL HIGH (ref 70–99)
Glucose-Capillary: 144 mg/dL — ABNORMAL HIGH (ref 70–99)
Glucose-Capillary: 167 mg/dL — ABNORMAL HIGH (ref 70–99)
Glucose-Capillary: 167 mg/dL — ABNORMAL HIGH (ref 70–99)
Glucose-Capillary: 174 mg/dL — ABNORMAL HIGH (ref 70–99)
Glucose-Capillary: 198 mg/dL — ABNORMAL HIGH (ref 70–99)
Glucose-Capillary: 203 mg/dL — ABNORMAL HIGH (ref 70–99)
Glucose-Capillary: 214 mg/dL — ABNORMAL HIGH (ref 70–99)

## 2023-09-10 LAB — MAGNESIUM
Magnesium: 2.3 mg/dL (ref 1.7–2.4)
Magnesium: 2.1 mg/dL (ref 1.7–2.4)

## 2023-09-10 MED ORDER — INSULIN ASPART 100 UNIT/ML IJ SOLN
0.0000 [IU] | INTRAMUSCULAR | Status: DC
  Administered 2023-09-10: 4 [IU] via SUBCUTANEOUS
  Administered 2023-09-10: 2 [IU] via SUBCUTANEOUS
  Administered 2023-09-10: 4 [IU] via SUBCUTANEOUS
  Administered 2023-09-11: 2 [IU] via SUBCUTANEOUS
  Administered 2023-09-11: 8 [IU] via SUBCUTANEOUS

## 2023-09-10 MED ORDER — ENOXAPARIN SODIUM 40 MG/0.4ML IJ SOSY: 40.0000 mg | PREFILLED_SYRINGE | Freq: Every day | INTRAMUSCULAR | Status: DC

## 2023-09-10 MED ORDER — INSULIN DETEMIR 100 UNIT/ML ~~LOC~~ SOLN
15.0000 [IU] | Freq: Two times a day (BID) | SUBCUTANEOUS | Status: DC
  Administered 2023-09-10: 15 [IU] via SUBCUTANEOUS
  Filled 2023-09-10 (×2): qty 0.15

## 2023-09-10 MED ORDER — ORAL CARE MOUTH RINSE: 15.0000 mL | OROMUCOSAL | Status: DC | PRN

## 2023-09-10 MED ORDER — AMLODIPINE BESYLATE 5 MG PO TABS
2.5000 mg | ORAL_TABLET | Freq: Every day | ORAL | Status: DC
  Administered 2023-09-10 – 2023-09-14 (×5): 2.5 mg via ORAL
  Filled 2023-09-10 (×5): qty 1

## 2023-09-10 MED ORDER — METOPROLOL TARTRATE 25 MG PO TABS
25.0000 mg | ORAL_TABLET | Freq: Two times a day (BID) | ORAL | Status: DC
  Administered 2023-09-10 – 2023-09-14 (×9): 25 mg via ORAL
  Filled 2023-09-10 (×9): qty 1

## 2023-09-10 MED ORDER — FUROSEMIDE 10 MG/ML IJ SOLN
20.0000 mg | Freq: Once | INTRAMUSCULAR | Status: AC
  Administered 2023-09-10: 20 mg via INTRAVENOUS
  Filled 2023-09-10: qty 2

## 2023-09-10 MED ORDER — INSULIN DETEMIR 100 UNIT/ML ~~LOC~~ SOLN
25.0000 [IU] | Freq: Two times a day (BID) | SUBCUTANEOUS | Status: DC
  Administered 2023-09-10: 25 [IU] via SUBCUTANEOUS
  Filled 2023-09-10 (×3): qty 0.25

## 2023-09-10 NOTE — Plan of Care (Signed)
  Problem: Education: Goal: Understanding of CV disease, CV risk reduction, and recovery process will improve Outcome: Progressing   Problem: Cardiac: Goal: Ability to achieve and maintain adequate cardiovascular perfusion will improve Outcome: Progressing   Problem: Coping: Goal: Ability to adjust to condition or change in health will improve Outcome: Progressing   Problem: Fluid Volume: Goal: Ability to maintain a balanced intake and output will improve Outcome: Progressing   Problem: Health Behavior/Discharge Planning: Goal: Ability to identify and utilize available resources and services will improve Outcome: Progressing Goal: Ability to manage health-related needs will improve Outcome: Progressing   Problem: Skin Integrity: Goal: Risk for impaired skin integrity will decrease Outcome: Progressing   Problem: Tissue Perfusion: Goal: Adequacy of tissue perfusion will improve Outcome: Progressing   Problem: Education: Goal: Knowledge of General Education information will improve Description: Including pain rating scale, medication(s)/side effects and non-pharmacologic comfort measures Outcome: Progressing   Problem: Clinical Measurements: Goal: Will remain free from infection Outcome: Progressing Goal: Diagnostic test results will improve Outcome: Progressing Goal: Respiratory complications will improve Outcome: Progressing   Problem: Activity: Goal: Risk for activity intolerance will decrease Outcome: Progressing   Problem: Coping: Goal: Level of anxiety will decrease Outcome: Progressing   Problem: Elimination: Goal: Will not experience complications related to bowel motility Outcome: Progressing Goal: Will not experience complications related to urinary retention Outcome: Progressing   Problem: Pain Managment: Goal: General experience of comfort will improve Outcome: Progressing   Problem: Safety: Goal: Ability to remain free from injury will  improve Outcome: Progressing

## 2023-09-10 NOTE — Progress Notes (Signed)
1 Day Post-Op Procedure(s) (LRB): CORONARY ARTERY BYPASS GRAFTING (CABG) TIMES TWO UTILIZING LEFT INTERNAL MAMMARY ARTERY AND ENDOSCOPIC VEIN HARVEST RIGHT GREATER SAPHENOUS VEIN (N/A) TRANSESOPHAGEAL ECHOCARDIOGRAM (N/A) Subjective: No complaints this AM, denies pain and nausea  Objective: Vital signs in last 24 hours: Temp:  [96.1 F (35.6 C)-99 F (37.2 C)] 98.4 F (36.9 C) (09/28 0400) Pulse Rate:  [71-92] 86 (09/28 0700) Cardiac Rhythm: Normal sinus rhythm (09/28 0000) Resp:  [9-25] 17 (09/28 0700) BP: (95-173)/(52-74) 95/65 (09/28 0700) SpO2:  [93 %-99 %] 95 % (09/28 0700) Arterial Line BP: (101-182)/(43-84) 128/62 (09/28 0700) FiO2 (%):  [40 %-50 %] 40 % (09/27 1535) Weight:  [97.3 kg] 97.3 kg (09/28 0600)  Hemodynamic parameters for last 24 hours: PAP: (20-42)/(8-23) 37/23 CO:  [3.9 L/min-7.1 L/min] 7.1 L/min CI:  [1.99 L/min/m2-3.6 L/min/m2] 3.6 L/min/m2  Intake/Output from previous day: 09/27 0701 - 09/28 0700 In: 5495.9 [I.V.:2819.4; Blood:225; NG/GT:60; IV Piggyback:2391.4] Out: 2567 [Urine:1870; Blood:400; Chest Tube:297] Intake/Output this shift: No intake/output data recorded.  General appearance: alert, cooperative, and no distress Neurologic: intact Heart: regular rate and rhythm Lungs: diminished breath sounds bibasilar Abdomen: normal findings: soft, non-tender  Lab Results: Recent Labs    09/09/23 1833 09/09/23 1911 09/10/23 0358  WBC 11.3*  --  15.6*  HGB 11.3* 10.9* 11.3*  HCT 34.1* 32.0* 34.7*  PLT 135*  --  191   BMET:  Recent Labs    09/09/23 1833 09/09/23 1911 09/10/23 0358  NA 138 140 137  K 4.5 4.5 4.6  CL 108  --  106  CO2 23  --  24  GLUCOSE 114*  --  127*  BUN 11  --  14  CREATININE 0.68  --  0.69  CALCIUM 8.0*  --  8.2*    PT/INR:  Recent Labs    09/09/23 1241  LABPROT 16.1*  INR 1.3*   ABG    Component Value Date/Time   PHART 7.304 (L) 09/09/2023 1911   HCO3 21.4 09/09/2023 1911   TCO2 23 09/09/2023 1911    ACIDBASEDEF 5.0 (H) 09/09/2023 1911   O2SAT 96 09/09/2023 1911   CBG (last 3)  Recent Labs    09/10/23 0409 09/10/23 0621 09/10/23 0735  GLUCAP 124* 143* 174*    Assessment/Plan: S/P Procedure(s) (LRB): CORONARY ARTERY BYPASS GRAFTING (CABG) TIMES TWO UTILIZING LEFT INTERNAL MAMMARY ARTERY AND ENDOSCOPIC VEIN HARVEST RIGHT GREATER SAPHENOUS VEIN (N/A) TRANSESOPHAGEAL ECHOCARDIOGRAM (N/A) POD # 1 Doing well NEURO- intact CV- in Sr with good hemodymaics  On NTG for BP  Resume norvasc, increase metoprolol  DC Swan and A line RESP- IS  RENAL- creatinine normal, weight up - diurese ENDO- CBG moderately elevated  Transition to Levemir + SSI Gi advance diet as tolerated Anemia secondary to ABL- mild SCD + enoxaparin fro DVT prophylaxis DC chest tubes Cardiac rehab   LOS: 4 days    Loreli Slot 09/10/2023

## 2023-09-10 NOTE — Progress Notes (Signed)
      [address]       [place] [address]             [phone]      POD # 1 CABG  Ambulated earlier  BP (!) 152/53   Pulse 81   Temp 99 F (37.2 C)   Resp 13   Ht 5\' 4"  (1.626 m)   Wt 97.3 kg   SpO2 95%   BMI 36.82 kg/m    Intake/Output Summary (Last 24 hours) at 09/10/2023 1815 Last data filed at 09/10/2023 1300 Gross per 24 hour  Intake 736.4 ml  Output 702 ml  Net 34.4 ml   Creatinine 0.84, K 4.5 Hct 34 CBG moderately elevated- jncrease Levemir  Kennede Lusk C. Dorris Fetch, MD [facility] [phone]

## 2023-09-11 ENCOUNTER — Inpatient Hospital Stay (HOSPITAL_COMMUNITY): Payer: Medicare Other

## 2023-09-11 LAB — GLUCOSE, CAPILLARY
Glucose-Capillary: 64 mg/dL — ABNORMAL LOW (ref 70–99)
Glucose-Capillary: 93 mg/dL (ref 70–99)
Glucose-Capillary: 201 mg/dL — ABNORMAL HIGH (ref 70–99)
Glucose-Capillary: 193 mg/dL — ABNORMAL HIGH (ref 70–99)
Glucose-Capillary: 197 mg/dL — ABNORMAL HIGH (ref 70–99)
Glucose-Capillary: 192 mg/dL — ABNORMAL HIGH (ref 70–99)

## 2023-09-11 LAB — BASIC METABOLIC PANEL
Anion gap: 5 (ref 5–15)
BUN: 12 mg/dL (ref 8–23)
CO2: 27 mmol/L (ref 22–32)
Calcium: 8.4 mg/dL — ABNORMAL LOW (ref 8.9–10.3)
Chloride: 105 mmol/L (ref 98–111)
Creatinine, Ser: 0.64 mg/dL (ref 0.44–1.00)
GFR, Estimated: >60 mL/min (ref >60)
Glucose, Bld: 68 mg/dL — ABNORMAL LOW (ref 70–99)
Potassium: 3.9 mmol/L (ref 3.5–5.1)
Sodium: 137 mmol/L (ref 135–145)

## 2023-09-11 LAB — CBC
HCT: 32 % — ABNORMAL LOW (ref 36.0–46.0)
Hemoglobin: 10.2 g/dL — ABNORMAL LOW (ref 12.0–15.0)
MCH: 28.7 pg (ref 26.0–34.0)
MCHC: 31.9 g/dL (ref 30.0–36.0)
MCV: 89.9 fL (ref 80.0–100.0)
Platelets: 141 10*3/uL — ABNORMAL LOW (ref 150–400)
RBC: 3.56 MIL/uL — ABNORMAL LOW (ref 3.87–5.11)
RDW: 12.9 % (ref 11.5–15.5)
WBC: 9.2 10*3/uL (ref 4.0–10.5)
nRBC: 0 % (ref 0.0–0.2)

## 2023-09-11 MED ORDER — SODIUM CHLORIDE 0.9% FLUSH
3.0000 mL | Freq: Two times a day (BID) | INTRAVENOUS | Status: DC
  Administered 2023-09-11 – 2023-09-14 (×6): 3 mL via INTRAVENOUS

## 2023-09-11 MED ORDER — ~~LOC~~ CARDIAC SURGERY, PATIENT & FAMILY EDUCATION: Freq: Once | Status: AC

## 2023-09-11 MED ORDER — LORATADINE 10 MG PO TABS
10.0000 mg | ORAL_TABLET | Freq: Every morning | ORAL | Status: DC
  Administered 2023-09-11 – 2023-09-14 (×4): 10 mg via ORAL
  Filled 2023-09-11 (×4): qty 1

## 2023-09-11 MED ORDER — FLUTICASONE PROPIONATE 50 MCG/ACT NA SUSP
2.0000 | Freq: Every morning | NASAL | Status: DC
  Administered 2023-09-11 – 2023-09-14 (×4): 2 via NASAL
  Filled 2023-09-11: qty 16

## 2023-09-11 MED ORDER — ZOLPIDEM TARTRATE 5 MG PO TABS: 5.0000 mg | ORAL_TABLET | Freq: Every evening | ORAL | Status: DC | PRN

## 2023-09-11 MED ORDER — INSULIN DETEMIR 100 UNIT/ML ~~LOC~~ SOLN
15.0000 [IU] | Freq: Two times a day (BID) | SUBCUTANEOUS | Status: DC
  Administered 2023-09-11 – 2023-09-14 (×7): 15 [IU] via SUBCUTANEOUS
  Filled 2023-09-11 (×9): qty 0.15

## 2023-09-11 MED ORDER — FUROSEMIDE 40 MG PO TABS
40.0000 mg | ORAL_TABLET | Freq: Every day | ORAL | Status: DC
  Administered 2023-09-11 – 2023-09-13 (×3): 40 mg via ORAL
  Filled 2023-09-11 (×3): qty 1

## 2023-09-11 MED ORDER — SODIUM CHLORIDE 0.9 % IV SOLN: 250.0000 mL | INTRAVENOUS | Status: DC | PRN

## 2023-09-11 MED ORDER — SODIUM CHLORIDE 0.9% FLUSH: 3.0000 mL | INTRAVENOUS | Status: DC | PRN

## 2023-09-11 MED ORDER — ENOXAPARIN SODIUM 40 MG/0.4ML IJ SOSY
40.0000 mg | PREFILLED_SYRINGE | INTRAMUSCULAR | Status: DC
  Administered 2023-09-11 – 2023-09-14 (×4): 40 mg via SUBCUTANEOUS
  Filled 2023-09-11 (×4): qty 0.4

## 2023-09-11 MED ORDER — INSULIN ASPART 100 UNIT/ML IJ SOLN
0.0000 [IU] | Freq: Three times a day (TID) | INTRAMUSCULAR | Status: DC
  Administered 2023-09-11 – 2023-09-12 (×3): 2 [IU] via SUBCUTANEOUS
  Administered 2023-09-12: 3 [IU] via SUBCUTANEOUS
  Administered 2023-09-13: 1 [IU] via SUBCUTANEOUS
  Administered 2023-09-13: 5 [IU] via SUBCUTANEOUS
  Administered 2023-09-13: 7 [IU] via SUBCUTANEOUS
  Administered 2023-09-14: 3 [IU] via SUBCUTANEOUS

## 2023-09-11 MED ORDER — POTASSIUM CHLORIDE ER 10 MEQ PO TBCR
20.0000 meq | EXTENDED_RELEASE_TABLET | Freq: Every day | ORAL | Status: DC
  Administered 2023-09-11: 20 meq via ORAL
  Filled 2023-09-11 (×3): qty 2

## 2023-09-11 MED ORDER — ALUM & MAG HYDROXIDE-SIMETH 200-200-20 MG/5ML PO SUSP: 15.0000 mL | Freq: Four times a day (QID) | ORAL | Status: DC | PRN

## 2023-09-11 NOTE — Progress Notes (Signed)
Sarah RN aware of order to remove CVC.

## 2023-09-11 NOTE — Progress Notes (Signed)
2 Days Post-Op Procedure(s) (LRB): CORONARY ARTERY BYPASS GRAFTING (CABG) TIMES TWO UTILIZING LEFT INTERNAL MAMMARY ARTERY AND ENDOSCOPIC VEIN HARVEST RIGHT GREATER SAPHENOUS VEIN (N/A) TRANSESOPHAGEAL ECHOCARDIOGRAM (N/A) Subjective: No complaints this AM, denies pain  Objective: Vital signs in last 24 hours: Temp:  [98.4 F (36.9 C)-98.7 F (37.1 C)] 98.7 F (37.1 C) (09/29 0400) Pulse Rate:  [74-88] 76 (09/29 0809) Cardiac Rhythm: Normal sinus rhythm (09/29 0400) Resp:  [12-29] 17 (09/29 0809) BP: (98-152)/(49-70) 121/70 (09/29 0530) SpO2:  [91 %-99 %] 97 % (09/29 0809) Arterial Line BP: (118-200)/(45-79) 122/47 (09/28 1600) Weight:  [93.7 kg] 93.7 kg (09/29 0500)  Hemodynamic parameters for last 24 hours:    Intake/Output from previous day: 09/28 0701 - 09/29 0700 In: 660.4 [P.O.:180; I.V.:180.4; IV Piggyback:300.1] Out: 2170 [Urine:2110; Chest Tube:60] Intake/Output this shift: No intake/output data recorded.  General appearance: alert, cooperative, and no distress Neurologic: intact Heart: regular rate and rhythm Lungs: diminished breath sounds bibasilar Abdomen: normal findings: soft, non-tender  Lab Results: Recent Labs    09/10/23 1634 09/11/23 0427  WBC 13.6* 9.2  HGB 11.0* 10.2*  HCT 33.6* 32.0*  PLT 161 141*   BMET:  Recent Labs    09/10/23 1634 09/11/23 0427  NA 134* 137  K 4.5 3.9  CL 102 105  CO2 23 27  GLUCOSE 173* 68*  BUN 17 12  CREATININE 0.84 0.64  CALCIUM 8.4* 8.4*    PT/INR:  Recent Labs    09/09/23 1241  LABPROT 16.1*  INR 1.3*   ABG    Component Value Date/Time   PHART 7.304 (L) 09/09/2023 1911   HCO3 21.4 09/09/2023 1911   TCO2 23 09/09/2023 1911   ACIDBASEDEF 5.0 (H) 09/09/2023 1911   O2SAT 96 09/09/2023 1911   CBG (last 3)  Recent Labs    09/11/23 0435 09/11/23 0458 09/11/23 0816  GLUCAP 64* 93 201*    Assessment/Plan: S/P Procedure(s) (LRB): CORONARY ARTERY BYPASS GRAFTING (CABG) TIMES TWO UTILIZING  LEFT INTERNAL MAMMARY ARTERY AND ENDOSCOPIC VEIN HARVEST RIGHT GREATER SAPHENOUS VEIN (N/A) TRANSESOPHAGEAL ECHOCARDIOGRAM (N/A) Transfer to 4E NEURO- intact CV- in SR  ASA, beta blocker, statin  Dc pacing wires RESP- continue IS for atelectasis RENAL- creatinine and lytes OK  PO Lasix ENDO- Hypoglycemic last night, hyperglycemic this Am  Decrease Levemir, change SSI to AC/ HS Gi - tolerating carb modified diet SCD + enoxaparin for DVT prophylaxis Anemia secondary to ABL- mild, follow Continue cardiac rehab   LOS: 5 days    Loreli Slot 09/11/2023

## 2023-09-12 ENCOUNTER — Inpatient Hospital Stay (HOSPITAL_COMMUNITY): Payer: Medicare Other

## 2023-09-12 LAB — GLUCOSE, CAPILLARY
Glucose-Capillary: 107 mg/dL — ABNORMAL HIGH (ref 70–99)
Glucose-Capillary: 192 mg/dL — ABNORMAL HIGH (ref 70–99)
Glucose-Capillary: 233 mg/dL — ABNORMAL HIGH (ref 70–99)
Glucose-Capillary: 226 mg/dL — ABNORMAL HIGH (ref 70–99)

## 2023-09-12 LAB — CBC
HCT: 36 % (ref 36.0–46.0)
Hemoglobin: 11.7 g/dL — ABNORMAL LOW (ref 12.0–15.0)
MCH: 28.4 pg (ref 26.0–34.0)
MCHC: 32.5 g/dL (ref 30.0–36.0)
MCV: 87.4 fL (ref 80.0–100.0)
Platelets: 157 10*3/uL (ref 150–400)
RBC: 4.12 MIL/uL (ref 3.87–5.11)
RDW: 12.7 % (ref 11.5–15.5)
WBC: 9.5 10*3/uL (ref 4.0–10.5)
nRBC: 0 % (ref 0.0–0.2)

## 2023-09-12 LAB — BASIC METABOLIC PANEL
Anion gap: 9 (ref 5–15)
BUN: 10 mg/dL (ref 8–23)
CO2: 29 mmol/L (ref 22–32)
Calcium: 8.7 mg/dL — ABNORMAL LOW (ref 8.9–10.3)
Chloride: 103 mmol/L (ref 98–111)
Creatinine, Ser: 0.73 mg/dL (ref 0.44–1.00)
GFR, Estimated: >60 mL/min (ref >60)
Glucose, Bld: 98 mg/dL (ref 70–99)
Potassium: 4 mmol/L (ref 3.5–5.1)
Sodium: 141 mmol/L (ref 135–145)

## 2023-09-12 MED ORDER — IRBESARTAN 150 MG PO TABS
150.0000 mg | ORAL_TABLET | Freq: Every day | ORAL | Status: DC
  Administered 2023-09-12: 150 mg via ORAL
  Filled 2023-09-12: qty 1

## 2023-09-12 MED ORDER — POTASSIUM CHLORIDE CRYS ER 10 MEQ PO TBCR
20.0000 meq | EXTENDED_RELEASE_TABLET | Freq: Two times a day (BID) | ORAL | Status: DC
  Administered 2023-09-12 – 2023-09-13 (×3): 20 meq via ORAL
  Filled 2023-09-12 (×7): qty 2

## 2023-09-12 MED FILL — Thrombin (Recombinant) For Soln 20000 Unit: CUTANEOUS | Qty: 1 | Status: AC

## 2023-09-12 MED FILL — Heparin Sodium (Porcine) Inj 1000 Unit/ML: Qty: 1000 | Status: AC

## 2023-09-12 MED FILL — Potassium Chloride Inj 2 mEq/ML: INTRAVENOUS | Qty: 40 | Status: AC

## 2023-09-12 MED FILL — Magnesium Sulfate Inj 50%: INTRAMUSCULAR | Qty: 10 | Status: AC

## 2023-09-12 NOTE — Progress Notes (Signed)
CARDIAC REHAB PHASE I   PRE:  Rate/Rhythm: 95 NSR  BP:  Sitting: 134/68      SaO2: 95 RA  MODE:  Ambulation: 470 ft   AD:   no AD  POST:  Rate/Rhythm: 107 ST  BP:  Sitting: 137/60      SaO2: 95 RA  Pt ambulated with supervision assist, pt able to stand without assistance and can tolerate walking the hallway without SOB. Pt returned to chair.   Faustino Congress  ACSM-CEP 8:43 AM 09/12/2023    Service time is from 0820 to 0845.

## 2023-09-12 NOTE — Progress Notes (Signed)
Mobility Specialist Progress Note:   09/12/23 1153  Mobility  Activity Ambulated with assistance in hallway  Level of Assistance Standby assist, set-up cues, supervision of patient - no hands on  Assistive Device None  Distance Ambulated (ft) 470 ft  Activity Response Tolerated well  Mobility Referral Yes  $Mobility charge 1 Mobility  Mobility Specialist Start Time (ACUTE ONLY) 1136  Mobility Specialist Stop Time (ACUTE ONLY) 1146  Mobility Specialist Time Calculation (min) (ACUTE ONLY) 10 min   Pre Mobility: 103 HR During Mobility: 112 HR Post Mobility: 105 HR   Pt received in bed, agreeable to mobility. Pt denied any discomfort during ambulation, asymptomatic throughout. Pt returned to bed with call bell in reach and all needs met.  Leory Plowman  Mobility Specialist Please contact via [employer] office at [phone]

## 2023-09-12 NOTE — TOC Transition Note (Deleted)
Transition of Care [facility]) - CM/SW Discharge Note   Patient Details  Name: Kerry Compton MRN: [id number] Date of Birth: [birth year]  [facility]) CM/SW Contact:  Nicanor Bake Phone Number: [phone] 09/12/2023, 4:16 PM   Clinical Narrative:  [facility] CSW received a phone call today that the pts auth  for SNF was denied. Representative was Adreen. Who stated that it can be appealed. Phone number [phone], Fax [phone]           Patient Goals and CMS Choice      Discharge Placement                         Discharge Plan and Services Additional resources added to the After Visit Summary for                                       Social Determinants of Health (SDOH) Interventions SDOH Screenings   Food Insecurity: No Food Insecurity (09/06/2023)  Housing: Low Risk  (09/06/2023)  Transportation Needs: No Transportation Needs (09/06/2023)  Utilities: Not At Risk (09/06/2023)  Social Connections: Unknown (04/14/2022)   Received from [facility]  Tobacco Use: Low Risk  (09/09/2023)     Readmission Risk Interventions     No data to display

## 2023-09-12 NOTE — Discharge Summary (Signed)
[address]       [place] [address]             [phone]    Physician Discharge Summary  Patient ID: Kerry Compton MRN: [id number] DOB/AGE: 67-Jan-1957 67 y.o.  Admit date: 09/06/2023 Discharge date: 09/12/2023  Admission Diagnoses:  Patient Active Problem List   Diagnosis Date Noted   NSTEMI (non-ST elevated myocardial infarction) (HCC) 09/06/2023   Precordial chest pain 05/07/2020   Educated about COVID-19 virus infection 05/07/2020   Asthma 08/28/2015   Type 1 diabetes mellitus (HCC) 03/19/2013   Hyperlipidemia 03/19/2013   Asthma, chronic 03/19/2013   Hypertension 03/19/2013   Discharge Diagnoses:  Patient Active Problem List   Diagnosis Date Noted   S/P CABG x 2 09/09/2023   NSTEMI (non-ST elevated myocardial infarction) (HCC) 09/06/2023   Precordial chest pain 05/07/2020   Educated about COVID-19 virus infection 05/07/2020   Asthma 08/28/2015   Type 1 diabetes mellitus (HCC) 03/19/2013   Hyperlipidemia 03/19/2013   Asthma, chronic 03/19/2013   Hypertension 03/19/2013   Discharged Condition: {condition:18240}  History of Present Illness:    Kerry Compton is a 67 year old female we are asked to see in CT surgical consultation for consideration of coronary artery surgical revascularization.  She has known cardiac risk factors of insulin-dependent diabetes, hypertension and hyperlipidemia.  She presented to the emergency department with chest pain.  The patient said she had been doing well up until a couple days ago when she developed acute onset of substernal chest discomfort and shortness of breath while walking up an incline.  The symptoms resolved at rest but did recur.  She has since continued to experience exertional chest discomfort which resolved with rest.  She presented to the emergency department yesterday and has ruled in for non-STEMI.  EKG showed normal sinus rhythm with anteroseptal infarct and mild isolated T wave  inversions in lead aVL.  High-sensitivity troponin I is being trended and so far has peaked at 111. He has had history of a negative exercise treadmill test in June 2021.  Has not been seen by cardiology since that study.  She was admitted and cardiology consultation was obtained.  She was placed on heparin.  Felt to require cardiac catheterization which was done today which revealed severe two-vessel obstructive coronary artery disease with an occluded LAD to mid LAD lesion. A high-grade circumflex lesion of 90% was noted.  There was only minor nonobstructive RCA disease.  PCI was attempted but they were unable to cross the obstructed LAD lesion.  Moderate LV dysfunction with an EF estimated at 35 to 40% was noted.  Additionally there was moderately elevated LVEDP at 28 mmHg.    Hospital Course:  Dr. Laneta Simmers reviewed the patient's diagnostic studies and determined she would benefit from surgery. He reviewed the treatment options as well as the risks and benefits of surgery. Kerry Compton was agreeable to proceed with surgery. She remained in stable condition until she was brought to the operating room on 09/27. She underwent CABG x 2 utilizing LIMA to LAD and SVG to OM as well as endoscopic harvest of the right greater saphenous vein. She tolerated the procedure well and was transferred to the SICU in stable condition.  Postoperative hospital course:  She has remained hemodynamically stable.  She was weaned from the ventilator using standard post cardiac surgical protocols without difficulty.  She has remained neurologically intact.  She did initially require nitroglycerin for blood pressure control  but this was weaned without difficulty.  She has been started on metoprolol and Norvasc has also been continued.  All routine lines, monitors and drainage devices have been discontinued in the standard fashion.  Epicardial pacing wires have been removed.  She does show some expected postoperative increased volume  and has been diuresed accordingly.  She has mild expected acute blood loss anemia which is being monitored clinically.  He has been given Lovenox for DVT prophylaxis.  She maintained sinus rhythm.  She has been started on statin.  Her blood pressure remained elevated and she was resumed on her home Norvasc and Diovan at a reduced dose.  Consults: None  Significant Diagnostic Studies: angiography:     Prox RCA to Mid RCA lesion is 30% stenosed.   Ost LAD lesion is 55% stenosed.   Prox LAD to Mid LAD lesion is 100% stenosed.   Prox Cx lesion is 90% stenosed.   Post intervention, there is a 100% residual stenosis.   There is moderate left ventricular systolic dysfunction.   LV end diastolic pressure is moderately elevated.   The left ventricular ejection fraction is 35-45% by visual estimate.   Severe 2 vessel obstructive CAD. Occluded proximal LAD with collaterals. High grade proximal LCx Moderate LV dysfunction EF estimated at 35-40% Moderately elevated LVEDP 28 mm Hg Unsuccessful PCI of the LAD due to inability to cross lesion with a wire   Plan: will consult CT surgery to consider CABG. I suspect she does have viability in LAD territory given low level troponin leak and lack of significant ST changes. No P2Y12 inhibitor administered during case.   Treatments: surgery:                                                                                                CARDIOVASCULAR SURGERY OPERATIVE NOTE   09/09/2023   Surgeon:  Alleen Borne, MD   First Assistant: Aloha Gell,  PA-C:   An experienced assistant was required given the complexity of this surgery and the standard of surgical care. The assistant was needed for endoscopic vein harvest, exposure, dissection, suctioning, retraction of delicate tissues and sutures, instrument exchange and for overall help during this procedure.     Preoperative Diagnosis:  Severe multi-vessel coronary artery disease     Postoperative  Diagnosis:  Same     Procedure:   Median Sternotomy Extracorporeal circulation 3.   Coronary artery bypass grafting x 2   Left internal mammary artery graft to the LAD SVG to OM     4.   Endoscopic vein harvest from the right leg      Discharge Exam: Blood pressure (!) 162/74, pulse 96, temperature 98.6 F (37 C), temperature source Oral, resp. rate 20, height 5\' 4"  (1.626 m), weight 95.4 kg, SpO2 93%. {physical [id number]   Discharge Medications:  The patient has been discharged on:   1.Beta Blocker:  Yes [ X  ]                              No   [   ]  If No, reason:  2.Ace Inhibitor/ARB: Yes [ X  ]                                     No  [    ]                                     If No, reason:  3.Statin:   Yes [ X  ]                  No  [   ]                  If No, reason:  4.Ecasa:  Yes  [  X ]                  No   [   ]                  If No, reason:  Patient had ACS upon admission:  Plavix/P2Y12 inhibitor: Yes [   ]                                      No  [   ]     Discharge Instructions     AMB Referral to Cardiac Rehabilitation - Phase II   Complete by: As directed    Diagnosis: NSTEMI   After initial evaluation and assessments completed: Virtual Based Care may be provided alone or in conjunction with Phase 2 Cardiac Rehab based on patient barriers.: Yes   Intensive Cardiac Rehabilitation (ICR) [facility] location only OR Traditional Cardiac Rehabilitation (TCR) *If criteria for ICR are not met will enroll in TCR [facility] only): Yes      Allergies as of 09/12/2023       Reactions   Azithromycin Diarrhea, Nausea And Vomiting   Indocin [indomethacin] Other (See Comments)   Light headed and loopy      Med Rec must be completed prior to using this [employer]***       Follow-up Information     Alleen Borne, MD Follow up.   Specialty: Cardiothoracic Surgery Contact information: [address] [place] [address] [phone]         Cindra Presume .   Contact information: [address] [place] [address]                Signed:  Lowella Dandy, PA-C  09/12/2023, 8:33 AM

## 2023-09-12 NOTE — Progress Notes (Signed)
3 Days Post-Op Procedure(s) (LRB): CORONARY ARTERY BYPASS GRAFTING (CABG) TIMES TWO UTILIZING LEFT INTERNAL MAMMARY ARTERY AND ENDOSCOPIC VEIN HARVEST RIGHT GREATER SAPHENOUS VEIN (N/A) TRANSESOPHAGEAL ECHOCARDIOGRAM (N/A) Subjective: No complaints.  Objective: Vital signs in last 24 hours: Temp:  [97.7 F (36.5 C)-98.6 F (37 C)] 98.6 F (37 C) (09/30 0500) Pulse Rate:  [76-96] 96 (09/30 0500) Cardiac Rhythm: Normal sinus rhythm (09/29 2300) Resp:  [14-24] 20 (09/30 0500) BP: (116-162)/(56-84) 162/74 (09/30 0500) SpO2:  [93 %-99 %] 93 % (09/30 0500) FiO2 (%):  [21 %] 21 % (09/29 1201) Weight:  [95.4 kg] 95.4 kg (09/30 0500)  Hemodynamic parameters for last 24 hours:    Intake/Output from previous day: 09/29 0701 - 09/30 0700 In: 0  Out: 2000 [Urine:2000] Intake/Output this shift: No intake/output data recorded.  General appearance: alert and cooperative Neurologic: intact Heart: regular rate and rhythm Lungs: clear to auscultation bilaterally Extremities: edema mild Wound: incision healing well.  Lab Results: Recent Labs    09/11/23 0427 09/12/23 0417  WBC 9.2 9.5  HGB 10.2* 11.7*  HCT 32.0* 36.0  PLT 141* 157   BMET:  Recent Labs    09/11/23 0427 09/12/23 0417  NA 137 141  K 3.9 4.0  CL 105 103  CO2 27 29  GLUCOSE 68* 98  BUN 12 10  CREATININE 0.64 0.73  CALCIUM 8.4* 8.7*    PT/INR:  Recent Labs    09/09/23 1241  LABPROT 16.1*  INR 1.3*   ABG    Component Value Date/Time   PHART 7.304 (L) 09/09/2023 1911   HCO3 21.4 09/09/2023 1911   TCO2 23 09/09/2023 1911   ACIDBASEDEF 5.0 (H) 09/09/2023 1911   O2SAT 96 09/09/2023 1911   CBG (last 3)  Recent Labs    09/11/23 1615 09/11/23 2123 09/12/23 0608  GLUCAP 197* 192* 107*    Assessment/Plan: S/P Procedure(s) (LRB): CORONARY ARTERY BYPASS GRAFTING (CABG) TIMES TWO UTILIZING LEFT INTERNAL MAMMARY ARTERY AND ENDOSCOPIC VEIN HARVEST RIGHT GREATER SAPHENOUS VEIN (N/A) TRANSESOPHAGEAL  ECHOCARDIOGRAM (N/A)  POD 3 Hemodynamically stable in sinus rhythm. BP 160 this am. Diovan resumed (Avapro).  Wt is still 10 lbs over preop. Continue lasix and KCL.  DM: glucose under adequate control on current regimen. Plan to resume her home insulin at discharge.  Continue IS, ambulation.   LOS: 6 days    Kerry Compton 09/12/2023

## 2023-09-12 NOTE — Plan of Care (Signed)
  Problem: Activity: Goal: Ability to return to baseline activity level will improve Outcome: Progressing   Problem: Health Behavior/Discharge Planning: Goal: Ability to safely manage health-related needs after discharge will improve Outcome: Progressing   

## 2023-09-12 NOTE — Care Management Important Message (Signed)
Important Message  Patient Details  Name: Kerry Compton MRN: [id number] Date of Birth: [birth year]   Important Message Given:  Yes - Medicare IM     Sherilyn Banker 09/12/2023, 12:35 PM

## 2023-09-13 DIAGNOSIS — I2583 Coronary atherosclerosis due to lipid rich plaque: Secondary | ICD-10-CM

## 2023-09-13 DIAGNOSIS — I10 Essential (primary) hypertension: Secondary | ICD-10-CM | POA: Diagnosis not present

## 2023-09-13 DIAGNOSIS — E7800 Pure hypercholesterolemia, unspecified: Secondary | ICD-10-CM

## 2023-09-13 DIAGNOSIS — I5032 Chronic diastolic (congestive) heart failure: Secondary | ICD-10-CM | POA: Diagnosis not present

## 2023-09-13 DIAGNOSIS — I214 Non-ST elevation (NSTEMI) myocardial infarction: Secondary | ICD-10-CM | POA: Diagnosis not present

## 2023-09-13 DIAGNOSIS — I2510 Atherosclerotic heart disease of native coronary artery without angina pectoris: Secondary | ICD-10-CM | POA: Diagnosis not present

## 2023-09-13 LAB — GLUCOSE, CAPILLARY
Glucose-Capillary: 122 mg/dL — ABNORMAL HIGH (ref 70–99)
Glucose-Capillary: 290 mg/dL — ABNORMAL HIGH (ref 70–99)
Glucose-Capillary: 325 mg/dL — ABNORMAL HIGH (ref 70–99)
Glucose-Capillary: 97 mg/dL (ref 70–99)

## 2023-09-13 MED ORDER — INSULIN ASPART 100 UNIT/ML IJ SOLN
3.0000 [IU] | Freq: Three times a day (TID) | INTRAMUSCULAR | Status: DC
  Administered 2023-09-13 – 2023-09-14 (×2): 3 [IU] via SUBCUTANEOUS

## 2023-09-13 MED ORDER — IRBESARTAN 300 MG PO TABS
300.0000 mg | ORAL_TABLET | Freq: Every day | ORAL | Status: DC
  Administered 2023-09-13 – 2023-09-14 (×2): 300 mg via ORAL
  Filled 2023-09-13 (×2): qty 1

## 2023-09-13 NOTE — Progress Notes (Signed)
Progress Note  Patient Name: Kerry Compton Date of Encounter: 09/13/2023  [facility] Cardiologist: Reatha Harps, MD   Patient Profile     Subjective   Doing well and anxious to go home.  Denies any chest pain or shortness of breath.  Ambulating in the hall without problems.  Inpatient Medications    Scheduled Meds:  acetaminophen  1,000 mg Oral Q6H   Or   acetaminophen (TYLENOL) oral liquid 160 mg/5 mL  1,000 mg Per Tube Q6H   amLODipine  2.5 mg Oral Daily   aspirin EC  325 mg Oral Daily   Or   aspirin  324 mg Per Tube Daily   atorvastatin  80 mg Oral Daily   bisacodyl  10 mg Oral Daily   Or   bisacodyl  10 mg Rectal Daily   budesonide (PULMICORT) nebulizer solution  0.25 mg Nebulization BID   Chlorhexidine Gluconate Cloth  6 each Topical Daily   docusate sodium  200 mg Oral Daily   enoxaparin (LOVENOX) injection  40 mg Subcutaneous Q24H   fluticasone  2 spray Each Nare q AM   furosemide  40 mg Oral Daily   insulin aspart  0-9 Units Subcutaneous TID WC   insulin detemir  15 Units Subcutaneous BID   irbesartan  300 mg Oral Daily   loratadine  10 mg Oral q AM   metoprolol tartrate  25 mg Oral BID   oxybutynin  10 mg Oral Daily   pantoprazole  40 mg Oral Daily   potassium chloride  20 mEq Oral BID   sodium chloride flush  3 mL Intravenous Q12H   Continuous Infusions:  sodium chloride     PRN Meds: sodium chloride, alum & mag hydroxide-simeth, metoprolol tartrate, ondansetron (ZOFRAN) IV, mouth rinse, oxyCODONE, sodium chloride flush, traMADol, zolpidem   Vital Signs    Vitals:   09/13/23 0336 09/13/23 0800 09/13/23 0823 09/13/23 0845  BP: (!) 144/68 111/78  111/78  Pulse: 91 95    Resp: 16   16  Temp: 98.2 F (36.8 C) 98.5 F (36.9 C)    TempSrc: Oral Oral    SpO2: 96% 99% 98%   Weight:      Height:        Intake/Output Summary (Last 24 hours) at 09/13/2023 0908 Last data filed at 09/12/2023 1920 Gross per 24 hour  Intake 120 ml   Output --  Net 120 ml      09/12/2023    5:00 AM 09/11/2023    5:00 AM 09/10/2023    6:00 AM  Last 3 Weights  Weight (lbs) 210 lb 4.8 oz 206 lb 9.1 oz 214 lb 8.1 oz  Weight (kg) 95.391 kg 93.7 kg 97.3 kg      Telemetry    Normal sinus rhythm- Personally Reviewed  ECG    EKG to review- Personally Reviewed  Physical Exam   GEN: No acute distress.   Neck: No JVD Cardiac: RRR, no murmurs, rubs, or gallops.  Respiratory: Clear to auscultation bilaterally. GI: Soft, nontender, non-distended  MS: No edema; No deformity. Neuro:  Nonfocal  Psych: Normal affect   Labs    High Sensitivity Troponin:   Recent Labs  Lab 09/06/23 1526 09/06/23 1710  TROPONINIHS 103* 111*      Chemistry Recent Labs  Lab 09/08/23 1648 09/08/23 1806 09/10/23 1634 09/11/23 0427 09/12/23 0417  NA 137   < > 134* 137 141  K 3.9   < >  4.5 3.9 4.0  CL 100   < > 102 105 103  CO2 24   < > 23 27 29   GLUCOSE 227*   < > 173* 68* 98  BUN 11   < > 17 12 10   CREATININE 0.94   < > 0.84 0.64 0.73  CALCIUM 9.3   < > 8.4* 8.4* 8.7*  PROT 6.5  --   --   --   --   ALBUMIN 3.7  --   --   --   --   AST 25  --   --   --   --   ALT 27  --   --   --   --   ALKPHOS 75  --   --   --   --   BILITOT 0.8  --   --   --   --   GFRNONAA >60   < > >60 >60 >60  ANIONGAP 13   < > 9 5 9    < > = values in this interval not displayed.     Hematology Recent Labs  Lab 09/10/23 1634 09/11/23 0427 09/12/23 0417  WBC 13.6* 9.2 9.5  RBC 3.79* 3.56* 4.12  HGB 11.0* 10.2* 11.7*  HCT 33.6* 32.0* 36.0  MCV 88.7 89.9 87.4  MCH 29.0 28.7 28.4  MCHC 32.7 31.9 32.5  RDW 13.1 12.9 12.7  PLT 161 141* 157    BNPNo results for input(s): "BNP", "PROBNP" in the last 168 hours.   DDimer No results for input(s): "DDIMER" in the last 168 hours.   Radiology    DG Chest 2 View  Result Date: 09/12/2023 CLINICAL DATA:  Status post CABG. EXAM: CHEST - 2 VIEW COMPARISON:  Radiograph yesterday FINDINGS: Right internal jugular  sheath has been removed. Median sternotomy and CABG. Stable heart size and mediastinal contours. Slightly improved lung volumes from prior exam with decreasing bibasilar atelectasis. Small bilateral pleural effusions persist. No pneumothorax. No pulmonary edema. IMPRESSION: Improved lung volumes with decreasing bibasilar atelectasis. Small bilateral pleural effusions. Electronically Signed   By: Narda Rutherford M.D.   On: 09/12/2023 10:24    Patient Profile     67 y.o. female  with diabetes, hypertension, hyperlipidemia, asthma admitted on 09/06/2023 for non-STEMI.   Assessment & Plan    # NSTEMI -Cath with occluded proximal LAD with collaterals.  Severe disease in the left circumflex.  Not amendable to PCI.   -Status post CABG POD #4 with LIMA to LAD and SVG to OM  -Doing well postop  -Continue aspirin 325 mg daily, atorvastatin 80 mg daily -Consolidate Lopressor to 50 mg daily  # Acute HFpEF -Secondary to MI.  LVEDP 28 mmHg at time of cath.   -2D echo EF 50 to 55% with G1 DD and mid to distal anterior and inferior hypokinesis as well as mid inferior lateral hypokinesis. -Appears euvolemic on exam today -Continue Lasix 40 mg daily, irbesartan 300 mg daily and consolidate Lopressor to Toprol XL 50 mg daily  # Hypertension -BP stable at 111/78 mmHg -Continue amlodipine 2.5 mg daily, irbesartan 300 mg daily -Consolidate Lopressor to Toprol-XL 50 mg daily at discharge  #Hyperlipidemia -LDL 77 on admission, HDL 50 and LP(a) 182 -Is on atorvastatin 80 mg daily at home -Add Zetia 10 mg daily -Need FLP and ALT in 6 weeks  CARDIOLOGY RECOMMENDATIONS:  Discharge is anticipated in the next 48 hours. Recommendations for medications and follow up:  Discharge Medications: Continue medications as they are  currently listed in the [facility]. Exceptions to the above: Add Zetia 10 mg daily Consolidate Lopressor to Toprol XL 50 mg daily  Follow Up: The patient's Primary Cardiologist is Reatha Harps, MD  Follow up in the office in 2 week(s).  Signed,  Armanda Magic, MD  9:14 AM 09/13/2023  [facility]     For questions or updates, please contact [facility] Please consult [email or web address] for contact info under        Signed, Armanda Magic, MD  09/13/2023, 9:08 AM

## 2023-09-13 NOTE — Progress Notes (Signed)
Pt ambulating without difficulty, has ambulated multiple times today. Will educate tomorrow.  Kerry Compton 09/13/2023 2:01 PM

## 2023-09-13 NOTE — Inpatient Diabetes Management (Signed)
Inpatient Diabetes Program Recommendations  AACE/ADA: New Consensus Statement on Inpatient Glycemic Control   Target Ranges:  Prepandial:   less than 140 mg/dL      Peak postprandial:   less than 180 mg/dL (1-2 hours)      Critically ill patients:  140 - 180 mg/dL    Latest Reference Range & Units 09/12/23 06:08 09/12/23 11:54 09/12/23 16:30 09/12/23 21:06 09/13/23 06:03 09/13/23 11:26  Glucose-Capillary 70 - 99 mg/dL 161 (H) 096 (H) 045 (H) 226 (H) 122 (H) 290 (H)   Review of Glycemic Control  Diabetes history: DM Outpatient Diabetes medications: Medtronic insulin pump Current orders for Inpatient glycemic control: Levemir 15 units BID, Novolog 0-9 units TID with meals  Inpatient Diabetes Program Recommendations:    Insulin: Post prandial glucose consistently elevated.  Please consider ordering Novolog 0-5 units at bedtime and Novolog 3 units TID with meals for meal coverage if patient eats at least 50% of meals.  Thanks, Orlando Penner, RN, MSN, CDCES Diabetes Coordinator Inpatient Diabetes Program [phone] (Team Pager from 8am to 5pm)

## 2023-09-13 NOTE — Progress Notes (Addendum)
      [address]       [place] [address]             [phone]       4 Days Post-Op Procedure(s) (LRB): CORONARY ARTERY BYPASS GRAFTING (CABG) TIMES TWO UTILIZING LEFT INTERNAL MAMMARY ARTERY AND ENDOSCOPIC VEIN HARVEST RIGHT GREATER SAPHENOUS VEIN (N/A) TRANSESOPHAGEAL ECHOCARDIOGRAM (N/A)  Subjective:  Patient without complaints.  She is ambulating without difficulty.  She has moved her bowels.  Objective: Vital signs in last 24 hours: Temp:  [98 F (36.7 C)-98.7 F (37.1 C)] 98.2 F (36.8 C) (10/01 0336) Pulse Rate:  [84-99] 91 (10/01 0336) Cardiac Rhythm: Normal sinus rhythm (09/30 1905) Resp:  [16-20] 16 (10/01 0336) BP: (118-166)/(66-80) 144/68 (10/01 0336) SpO2:  [92 %-97 %] 96 % (10/01 0336)  Intake/Output from previous day: 09/30 0701 - 10/01 0700 In: 120 [P.O.:120] Out: -   General appearance: alert, cooperative, and no distress Heart: regular rate and rhythm Lungs: clear to auscultation bilaterally Abdomen: soft, non-tender; bowel sounds normal; no masses,  no organomegaly Extremities: edema trace Wound: clean and dry  Lab Results: Recent Labs    09/11/23 0427 09/12/23 0417  WBC 9.2 9.5  HGB 10.2* 11.7*  HCT 32.0* 36.0  PLT 141* 157   BMET:  Recent Labs    09/11/23 0427 09/12/23 0417  NA 137 141  K 3.9 4.0  CL 105 103  CO2 27 29  GLUCOSE 68* 98  BUN 12 10  CREATININE 0.64 0.73  CALCIUM 8.4* 8.7*    PT/INR: No results for input(s): "LABPROT", "INR" in the last 72 hours. ABG    Component Value Date/Time   PHART 7.304 (L) 09/09/2023 1911   HCO3 21.4 09/09/2023 1911   TCO2 23 09/09/2023 1911   ACIDBASEDEF 5.0 (H) 09/09/2023 1911   O2SAT 96 09/09/2023 1911   CBG (last 3)  Recent Labs    09/12/23 1630 09/12/23 2106 09/13/23 0603  GLUCAP 233* 226* 122*    Assessment/Plan: S/P Procedure(s) (LRB): CORONARY ARTERY BYPASS GRAFTING (CABG) TIMES TWO UTILIZING LEFT INTERNAL MAMMARY ARTERY AND ENDOSCOPIC VEIN  HARVEST RIGHT GREATER SAPHENOUS VEIN (N/A) TRANSESOPHAGEAL ECHOCARDIOGRAM (N/A)  CV-NSR, BP remains elevated- continue Lopressor, Norvasc, will increase Avapro to home dose Pulm- not requiring oxygen, continue IS Renal- creatinine stable, weight is below baseline, ? Stop Lasix? DM- sugars are elevated at times.. will plan to resume home insulin at discharge Dispo- patient doing well, will plan to increase Avapro to home dose today, if patient remains clinically stable will plan for discharge home tomorrow   LOS: 7 days    Lowella Dandy, PA-C 09/13/2023   Chart reviewed, patient examined, agree with above. She looks good overall. Wt is probably about at preop. Does not need further diuresis.  I think she can go home tomorrow.

## 2023-09-14 ENCOUNTER — Other Ambulatory Visit (HOSPITAL_COMMUNITY): Payer: Self-pay

## 2023-09-14 LAB — GLUCOSE, CAPILLARY: Glucose-Capillary: 221 mg/dL — ABNORMAL HIGH (ref 70–99)

## 2023-09-14 MED ORDER — ENSURE ENLIVE PO LIQD: 237.0000 mL | Freq: Two times a day (BID) | ORAL | Status: DC

## 2023-09-14 MED ORDER — ASPIRIN 81 MG PO TBEC: 81.0000 mg | DELAYED_RELEASE_TABLET | Freq: Every day | ORAL | Status: AC

## 2023-09-14 MED ORDER — OXYCODONE HCL 5 MG PO TABS
5.0000 mg | ORAL_TABLET | ORAL | 0 refills | Status: DC | PRN
  Filled 2023-09-14: qty 30, 5d supply, fill #0

## 2023-09-14 MED ORDER — ASPIRIN 325 MG PO TBEC: 325.0000 mg | DELAYED_RELEASE_TABLET | Freq: Every day | ORAL | Status: DC

## 2023-09-14 MED ORDER — ACETAMINOPHEN 500 MG PO TABS: 500.0000 mg | ORAL_TABLET | Freq: Four times a day (QID) | ORAL | Status: AC | PRN

## 2023-09-14 MED ORDER — METOPROLOL TARTRATE 25 MG PO TABS
25.0000 mg | ORAL_TABLET | Freq: Two times a day (BID) | ORAL | 3 refills | Status: DC
  Filled 2023-09-14: qty 60, 30d supply, fill #0

## 2023-09-14 MED ORDER — CLOPIDOGREL BISULFATE 75 MG PO TABS
75.0000 mg | ORAL_TABLET | Freq: Every day | ORAL | 11 refills | Status: DC
Start: 2023-09-14 — End: 2023-10-03
  Filled 2023-09-14: qty 30, 30d supply, fill #0

## 2023-09-14 MED FILL — Lidocaine HCl Local Soln Prefilled Syringe 100 MG/5ML (2%): INTRAMUSCULAR | Qty: 5 | Status: AC

## 2023-09-14 MED FILL — Mannitol IV Soln 20%: INTRAVENOUS | Qty: 500 | Status: AC

## 2023-09-14 MED FILL — Sodium Chloride IV Soln 0.9%: INTRAVENOUS | Qty: 2000 | Status: AC

## 2023-09-14 MED FILL — Heparin Sodium (Porcine) Inj 1000 Unit/ML: INTRAMUSCULAR | Qty: 20 | Status: AC

## 2023-09-14 MED FILL — Sodium Bicarbonate IV Soln 8.4%: INTRAVENOUS | Qty: 50 | Status: AC

## 2023-09-14 MED FILL — Electrolyte-R (PH 7.4) Solution: INTRAVENOUS | Qty: 3000 | Status: AC

## 2023-09-14 NOTE — Plan of Care (Signed)
  Problem: Cardiovascular: Goal: Ability to achieve and maintain adequate cardiovascular perfusion will improve Outcome: Progressing Goal: Vascular access site(s) Level 0-1 will be maintained Outcome: Progressing   Problem: Health Behavior/Discharge Planning: Goal: Ability to safely manage health-related needs after discharge will improve Outcome: Progressing   Problem: Coping: Goal: Ability to adjust to condition or change in health will improve Outcome: Progressing   Problem: Fluid Volume: Goal: Ability to maintain a balanced intake and output will improve Outcome: Progressing   Problem: Metabolic: Goal: Ability to maintain appropriate glucose levels will improve Outcome: Progressing

## 2023-09-14 NOTE — Progress Notes (Signed)
Pt received OHS book and education on restrictions, heart healthy diet, ex guidelines, Move in the Tube sheet, incentive spirometer use when d/c and CRPII. Pt denies questions and was encouraged to look in the book for additional information. Referral placed to [facility].   Faustino Congress 09/14/2023 8:31 AM   [phone]

## 2023-09-14 NOTE — Progress Notes (Addendum)
      [address]       [place] [address]             [phone]    5 Days Post-Op Procedure(s) (LRB): CORONARY ARTERY BYPASS GRAFTING (CABG) TIMES TWO UTILIZING LEFT INTERNAL MAMMARY ARTERY AND ENDOSCOPIC VEIN HARVEST RIGHT GREATER SAPHENOUS VEIN (N/A) TRANSESOPHAGEAL ECHOCARDIOGRAM (N/A)  Subjective:  Patient sitting up in chair.  Feeling good and ready to go home.  + ambulation  + BM  Objective: Vital signs in last 24 hours: Temp:  [98.2 F (36.8 C)-98.6 F (37 C)] 98.3 F (36.8 C) (10/02 0734) Pulse Rate:  [83-100] 99 (10/02 0742) Cardiac Rhythm: Normal sinus rhythm (10/01 2100) Resp:  [10-20] 16 (10/02 0742) BP: (111-158)/(63-89) 132/74 (10/02 0734) SpO2:  [92 %-99 %] 95 % (10/02 0742) Weight:  [91.7 kg] 91.7 kg (10/02 0610)  Intake/Output from previous day: 10/01 0701 - 10/02 0700 In: 240 [P.O.:240] Out: -   General appearance: alert, cooperative, and no distress Heart: regular rate and rhythm Lungs: clear to auscultation bilaterally Abdomen: soft, non-tender; bowel sounds normal; no masses,  no organomegaly Extremities: edema minimal Wound: clean and dry  Lab Results: Recent Labs    09/12/23 0417  WBC 9.5  HGB 11.7*  HCT 36.0  PLT 157   BMET:  Recent Labs    09/12/23 0417  NA 141  K 4.0  CL 103  CO2 29  GLUCOSE 98  BUN 10  CREATININE 0.73  CALCIUM 8.7*    PT/INR: No results for input(s): "LABPROT", "INR" in the last 72 hours. ABG    Component Value Date/Time   PHART 7.304 (L) 09/09/2023 1911   HCO3 21.4 09/09/2023 1911   TCO2 23 09/09/2023 1911   ACIDBASEDEF 5.0 (H) 09/09/2023 1911   O2SAT 96 09/09/2023 1911   CBG (last 3)  Recent Labs    09/13/23 1545 09/13/23 2048 09/14/23 0608  GLUCAP 325* 97 221*    Assessment/Plan: S/P Procedure(s) (LRB): CORONARY ARTERY BYPASS GRAFTING (CABG) TIMES TWO UTILIZING LEFT INTERNAL MAMMARY ARTERY AND ENDOSCOPIC VEIN HARVEST RIGHT GREATER SAPHENOUS VEIN (N/A) TRANSESOPHAGEAL  ECHOCARDIOGRAM (N/A)  CV- NSR, BP improved- continue Lopressor, Norvasc, Avapro Pulm- not requiring oxygen, continue IS Renal- creatinine stable, weight is below baseline.. no further indication for lasix DM- resuming home insulin at discharge Dispo- patient stable, will d/c home today   LOS: 8 days   Lowella Dandy, PA-C 09/14/2023   Chart reviewed, patient examined, agree with above. She looks and feels well. Plan home today.

## 2023-09-14 NOTE — TOC Transition Note (Signed)
Transition of Care (TOC) - CM/SW Discharge Note Donn Pierini RN, BSN Transitions of Care Unit 4E- RN Case Manager See Treatment Team for direct phone #   Patient Details  Name: Kerry Compton MRN: [id number] Date of Birth: 02-04-1956  Transition of Care [facility]) CM/SW Contact:  Darrold Span, RN Phone Number: 09/14/2023, 12:09 PM   Clinical Narrative:    Pt stable for transition home today, family to transport home. No TOC needs noted for [facility] or DME.   Pt to follow up as per AVS instructions   Final next level of care: Home/Self Care Barriers to Discharge: Barriers Resolved   Patient Goals and CMS Choice   Choice offered to / list presented to : NA  Discharge Placement               Home          Discharge Plan and Services Additional resources added to the After Visit Summary for     Discharge Planning Services: CM Consult Post Acute Care Choice: NA          DME Arranged: N/A DME Agency: NA       HH Arranged: NA HH Agency: NA        Social Determinants of Health (SDOH) Interventions SDOH Screenings   Food Insecurity: No Food Insecurity (09/06/2023)  Housing: Low Risk  (09/06/2023)  Transportation Needs: No Transportation Needs (09/06/2023)  Utilities: Not At Risk (09/06/2023)  Social Connections: Unknown (04/14/2022)   [facility]  Tobacco Use: Low Risk  (09/09/2023)     Readmission Risk Interventions     No data to display

## 2023-09-19 ENCOUNTER — Telehealth (HOSPITAL_COMMUNITY): Payer: Self-pay

## 2023-09-19 NOTE — Telephone Encounter (Signed)
Pt insurance is active and benefits verified through [facility] Medicare. Co-pay $0.00, DED $0.00/$0.00 met, out of pocket $3,600.00/$138.50 met, co-insurance 0%. No pre-authorization required. Passport, 09/19/23 @ 4:12PM, REF#[id number]   How many CR sessions are covered? (36 visits for TCR, 72 visits for ICR)72 Is this a lifetime maximum or an annual maximum? Annual Has the member used any of these services to date? No Is there a time limit (weeks/months) on start of program and/or program completion? No     Will contact patient to see if she is interested in the Cardiac Rehab Program. If interested, patient will need to complete follow up appt. Once completed, patient will be contacted for scheduling upon review by the RN Navigator.

## 2023-09-20 NOTE — Telephone Encounter (Signed)
Attempted to call patient in regards to Cardiac Rehab - LM on VM 

## 2023-09-21 ENCOUNTER — Ambulatory Visit: Payer: Self-pay | Admitting: *Deleted

## 2023-09-21 DIAGNOSIS — Z4802 Encounter for removal of sutures: Secondary | ICD-10-CM

## 2023-09-21 NOTE — Progress Notes (Signed)
Patient arrived for nurse visit to remove sutures post- CABG 9/27 by Dr. Laneta Simmers.  Three sutures removed with no signs or symptoms of infection noted.  Incisions well approximated. Patient tolerated suture removal well.  Patient instructed to keep the incision site clean and dry. Medications reviewed with patient. Patient acknowledged instructions given.  All questions answered.

## 2023-09-26 NOTE — Progress Notes (Deleted)
Cardiology Office Note:    Date:  09/26/2023   ID:  Kerry Compton, DOB [birth year], MRN [id number]  PCP:  Laurann Montana, MD   [facility] Providers Cardiologist:  Reatha Harps, MD { Click to update primary MD,subspecialty MD or APP then REFRESH:1}    Referring MD: Laurann Montana, MD   No chief complaint on file. ***  History of Present Illness:    Kerry Compton is a 67 y.o. female with a hx of CAD s/p CABG x 2, HTN, type 1 DM, and HLD.  She presented to [facility] ER 08/25/2023 with chest pain concerning for angina and a mildly elevated troponin.  She proceeded to heart catheterization on 09/07/2023 found to have proximal LAD stenosis of 55% followed by 100% occlusion of the mid LAD, high-grade LCx stenosis of 90%, and mild 30% stenosis in the RCA.  She did have collaterals from right to left.  LVEF estimated at 35 to 40% at the time of heart catheterization and LVEDP was elevated.  Follow-up echocardiogram showed a low normal LVEF of 50-55%, grade 1 DD, trivial MR, and no aortic stenosis.  Given her disease, CT surgery was consulted and she ultimately underwent CABG x 2 with LIMA-LAD, SVG-OM on 09/09/2023 with Dr. Laneta Simmers.  Her postop course was uneventful and she was discharged on 09/14/2023.  She presents today for scheduled cardiology follow-up.    CAD s/p CABG x 2 (LIMA-LAD, SVG-OM) 09/09/2023 -Remains on 325 mg aspirin, 80 mg Lipitor, Toprol 50 mg   Hyperlipidemia with LDL goal < 70 09/07/2023: Cholesterol 137; HDL 50; LDL Cholesterol 77; Triglycerides 50; VLDL 10 LP(a) 182 Discharged on 80 mg Lipitor and 10 mg Zetia Can check lipids and LFTs in 6 weeks Needs [id number]   Chronic diastolic dysfunction Hypertension - Acute HFpEF felt secondary to MI, LVEDP was 20 mmHg at the time of cath, follow-up echocardiogram showed a low normal EF of 50-55% -GDMT: Irbesartan 300 mg daily, 50 mg Toprol, and 2.5 mg amlodipine   IDDM type 1 A1c 7.0% Per primary    Past  Medical History:  Diagnosis Date   Allergy    Asthma    Diabetes mellitus    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, no current liver problems   Hyperlipemia    Hypertension     Past Surgical History:  Procedure Laterality Date   COLONOSCOPY WITH PROPOFOL N/A 06/18/2014   Procedure: COLONOSCOPY WITH PROPOFOL;  Surgeon: Charolett Bumpers, MD;  Location: [facility] ENDOSCOPY;  Service: Endoscopy;  Laterality: N/A;   CORONARY ARTERY BYPASS GRAFT N/A 09/09/2023   Procedure: CORONARY ARTERY BYPASS GRAFTING (CABG) TIMES TWO UTILIZING LEFT INTERNAL MAMMARY ARTERY AND ENDOSCOPIC VEIN HARVEST RIGHT GREATER SAPHENOUS VEIN;  Surgeon: Alleen Borne, MD;  Location: [facility] OR;  Service: Open Heart Surgery;  Laterality: N/A;   LEFT HEART CATH AND CORONARY ANGIOGRAPHY N/A 09/07/2023   Procedure: LEFT HEART CATH AND CORONARY ANGIOGRAPHY;  Surgeon: [place]azil[place], Peter M, MD;  Location: [facility] INVASIVE CV LAB;  Service: Cardiovascular;  Laterality: N/A;   ROTATOR CUFF REPAIR Right 10 yrs ago   SEPTOPLASTY     TEE WITHOUT CARDIOVERSION N/A 09/09/2023   Procedure: TRANSESOPHAGEAL ECHOCARDIOGRAM;  Surgeon: Alleen Borne, MD;  Location: [facility] OR;  Service: Open Heart Surgery;  Laterality: N/A;   TUBAL LIGATION      Current Medications: No outpatient medications have been marked as taking for the 10/03/23 encounter (Appointment) with Marcelino Duster, PA.  Allergies:   Azithromycin and Indocin [indomethacin]   Social History   Socioeconomic History   Marital status: Single    Spouse name: Not on file   Number of children: 2   Years of education: Not on file   Highest education level: Not on file  Occupational History   Occupation: Aeronautical engineer.  Tobacco Use   Smoking status: Never   Smokeless tobacco: Never  Vaping Use   Vaping status: Never Used  Substance and Sexual Activity   Alcohol use: No    Alcohol/week: 0.0 standard drinks of alcohol   Drug use: No   Sexual activity: Not on file  Other Topics  Concern   Not on file  Social History Narrative   Divorced, lives with son.   Social Determinants of Health   Financial Resource Strain: Not on file  Food Insecurity: No Food Insecurity (09/06/2023)   Hunger Vital Sign    Worried About Running Out of Food in the Last Year: Never true    Ran Out of Food in the Last Year: Never true  Transportation Needs: No Transportation Needs (09/06/2023)   PRAPARE - Administrator, Civil Service (Medical): No    Lack of Transportation (Non-Medical): No  Physical Activity: Not on file  Stress: Not on file  Social Connections: Unknown (04/14/2022)   Received from [facility]   Social Network    Social Network: Not on file     Family History: The patient's ***family history includes Arthritis in her mother; Asthma in her mother; Diabetes in her brother, brother, maternal grandmother, and mother; Hyperlipidemia in her brother; Hypertension in her brother.  ROS:   Please see the history of present illness.    *** All other systems reviewed and are negative.  EKGs/Labs/Other Studies Reviewed:    The following studies were reviewed today: ***      Recent Labs: 09/07/2023: TSH 0.967 09/08/2023: ALT 27 09/10/2023: Magnesium 2.1 09/12/2023: BUN 10; Creatinine, Ser 0.73; Hemoglobin 11.7; Platelets 157; Potassium 4.0; Sodium 141  Recent Lipid Panel    Component Value Date/Time   CHOL 137 09/07/2023 0205   TRIG 50 09/07/2023 0205   HDL 50 09/07/2023 0205   CHOLHDL 2.7 09/07/2023 0205   VLDL 10 09/07/2023 0205   LDLCALC 77 09/07/2023 0205     Risk Assessment/Calculations:   {Does this patient have ATRIAL FIBRILLATION?[phone]}  No BP recorded.  {Refresh Note OR Click here to enter BP  :1}***         Physical Exam:    VS:  There were no vitals taken for this visit.    Wt Readings from Last 3 Encounters:  09/14/23 202 lb 3.2 oz (91.7 kg)  06/15/20 215 lb (97.5 kg)  05/08/20 221 lb 6.4 oz (100.4 kg)      GEN: *** Well nourished, well developed in no acute distress HEENT: Normal NECK: No JVD; No carotid bruits LYMPHATICS: No lymphadenopathy CARDIAC: ***RRR, no murmurs, rubs, gallops RESPIRATORY:  Clear to auscultation without rales, wheezing or rhonchi  ABDOMEN: Soft, non-tender, non-distended MUSCULOSKELETAL:  No edema; No deformity  SKIN: Warm and dry NEUROLOGIC:  Alert and oriented x 3 PSYCHIATRIC:  Normal affect   ASSESSMENT:    No diagnosis found. PLAN:    In order of problems listed above:  ***  {The patient has an active order for outpatient cardiac rehabilitation.   Please indicate if the patient is ready to start. Do NOT delete this.  It will auto  delete.  Refresh note, then sign.              Click here to document readiness and see contraindications.  :1}  Cardiac Rehabilitation Eligibility Assessment       {Are you ordering a CV Procedure (e.g. stress test, cath, DCCV, TEE, etc)?   Press F2        :[id number]}    Medication Adjustments/Labs and Tests Ordered: Current medicines are reviewed at length with the patient today.  Concerns regarding medicines are outlined above.  No orders of the defined types were placed in this encounter.  No orders of the defined types were placed in this encounter.   There are no Patient Instructions on file for this visit.   Signed, Marcelino Duster, [place]  09/26/2023 7:19 PM    [facility]

## 2023-10-02 NOTE — Progress Notes (Unsigned)
Cardiology Office Note    Date:  10/03/2023  ID:  Kerry Compton, DOB 1956-10-14, MRN [id number] PCP:  Laurann Montana, MD  Cardiologist:  Reatha Harps, MD  Electrophysiologist:  None   Chief Complaint: Follow up s/p CABG   History of Present Illness: .    Kerry Compton is a 67 y.o. female with visit-pertinent history of CAD s/p CABG x 2 (LIMA to the LAD and SVG to OM), hyperlipidemia, hypertension and type 1 diabetes mellitus.  On 08/25/2023 she presented to [facility] ER with chest pain concerning for angina and a mildly elevated troponin.  She underwent cardiac catheterization on 09/07/2023 and was found to have proximal LAD stenosis of 55% followed by 100% occlusion of the mid LAD, high-grade LCx stenosis of 90% and mild 30% stenosis in the RCA.  She did have collaterals from right to left.  Her LVEF was estimated at 35 to 40% at the time of heart catheterization and LVEDP was elevated.  Her follow-up echocardiogram showed a low normal LVEF of 50 to 55%, grade 1 DD, trivial MR and no aortic stenosis.  She ultimately underwent CABG x 2 with LIMA to the LAD and SVG to OM on 09/09/2023 with Dr. Laneta Simmers.  Her postoperative course was uneventful, she was discharged on 09/14/2023.  Today she presents for cardiology follow-up.  She reports that she is doing very well overall. She denies chest pain, shortness of breath, lower extremity edema, palpitations. She has been walking daily, tolerating well. She has been monitoring her heart rate and blood pressure at home, before her morning medications. Her blood pressure has been well controlled, on review of home heart rate she is slightly tachycardic, but after her morning Lopressor her rate is better controlled.   Labwork independently reviewed: 09/12/2023: Sodium 141, potassium 4.0, creatinine 0.73, hemoglobin 11.7, hematocrit 36 09/07/2023: Total cholesterol 137, triglycerides 50, HDL 50, LDL 77, lipoprotein a 182.2 ROS: .   Today she denies chest  pain, shortness of breath, lower extremity edema, fatigue, palpitations, melena, hematuria, hemoptysis, diaphoresis, weakness, presyncope, syncope, orthopnea, and PND.  All other systems are reviewed and otherwise negative. Studies Reviewed: Marland Kitchen    EKG:  EKG is ordered today, personally reviewed, demonstrating  EKG Interpretation Date/Time:  Monday October 03 2023 11:59:41 EDT Ventricular Rate:  83 PR Interval:  156 QRS Duration:  66 QT Interval:  348 QTC Calculation: 408 R Axis:   8  Text Interpretation: Normal sinus rhythm Possible Left atrial enlargement Low voltage QRS Septal infarct (cited on or before 06-Sep-2023) When compared with ECG of 10-Sep-2023 07:13, No significant change was found Confirmed by Reather Littler [phone]) on 10/03/2023 1:16:56 PM   CV Studies:  Cardiac Studies & Procedures   CARDIAC CATHETERIZATION  CARDIAC CATHETERIZATION 09/07/2023  Narrative   Prox RCA to Mid RCA lesion is 30% stenosed.   Ost LAD lesion is 55% stenosed.   Prox LAD to Mid LAD lesion is 100% stenosed.   Prox Cx lesion is 90% stenosed.   Post intervention, there is a 100% residual stenosis.   There is moderate left ventricular systolic dysfunction.   LV end diastolic pressure is moderately elevated.   The left ventricular ejection fraction is 35-45% by visual estimate.  Severe 2 vessel obstructive CAD. Occluded proximal LAD with collaterals. High grade proximal LCx Moderate LV dysfunction EF estimated at 35-40% Moderately elevated LVEDP 28 mm Hg Unsuccessful PCI of the LAD due to inability to cross lesion with a wire  Plan:  will consult CT surgery to consider CABG. I suspect she does have viability in LAD territory given low level troponin leak and lack of significant ST changes. No P2Y12 inhibitor administered during case.  Findings Coronary Findings Diagnostic  Dominance: Right  Left Anterior Descending Collaterals Mid LAD filled by collaterals from 1st Mrg.  Ost LAD lesion is 55%  stenosed. The lesion is severely calcified. Prox LAD to Mid LAD lesion is 100% stenosed. The lesion is severely calcified.  First Septal Branch Collaterals 1st Sept filled by collaterals from RPDA.  Left Circumflex Prox Cx lesion is 90% stenosed.  Right Coronary Artery Prox RCA to Mid RCA lesion is 30% stenosed. The lesion is calcified.  Intervention  Prox LAD to Mid LAD lesion Angioplasty CATH VISTA GUIDE 6FR XBLAD3.5 guide catheter was inserted. GUIDEWIRE JUDO 3 190 guidewire used to cross lesion. Post-Intervention Lesion Assessment The intervention was unsuccessful due to inability to cross the lesion with wire. Pre-interventional TIMI flow is 0. Post-intervention TIMI flow is 0. No complications occurred at this lesion. There is a 100% residual stenosis post intervention.   STRESS TESTS  EXERCISE TOLERANCE TEST (ETT) 05/29/2020  Narrative  Adequate exercise capacity, achieved 8.5 METs  Upsloping ST depressions with stress  No evidence of ischemia   ECHOCARDIOGRAM  ECHOCARDIOGRAM COMPLETE 09/08/2023  Narrative ECHOCARDIOGRAM REPORT    Patient Name:   Kerry Compton Date of Exam: 09/07/2023 Medical Rec #:  [id number]            Height:       64.0 in Accession #:    [id number]           Weight:       206.1 lb Date of Birth:  1956/02/10           BSA:          1.982 m Patient Age:    66 years             BP:           155/78 mmHg Patient Gender: F                    HR:           77 bpm. Exam Location:  Inpatient  Procedure: 2D Echo, Cardiac Doppler, Color Doppler and Intracardiac Opacification Agent  Indications:    NSTEMI I21.4  History:        Patient has no prior history of Echocardiogram examinations. NSTEMI; Risk Factors:Hypertension, Diabetes, Dyslipidemia and Non-Smoker.  Sonographer:    Aron Baba Referring Phys: [id number] TIMOTHY S OPYD   Sonographer Comments: Technically difficult study due to poor echo windows. IMPRESSIONS   1. Left  ventricular ejection fraction, by estimation, is 50 to 55%. The left ventricle has low normal function. The left ventricle demonstrates regional wall motion abnormalities (see scoring diagram/findings for description). Left ventricular diastolic parameters are consistent with Grade I diastolic dysfunction (impaired relaxation). 2. Right ventricular systolic function is normal. The right ventricular size is normal. 3. The mitral valve is degenerative. Trivial mitral valve regurgitation. No evidence of mitral stenosis. 4. The aortic valve is tricuspid. There is moderate calcification of the aortic valve. Aortic valve regurgitation is not visualized. No aortic stenosis is present. 5. The inferior vena cava is normal in size with greater than 50% respiratory variability, suggesting right atrial pressure of 3 mmHg.  FINDINGS Left Ventricle: Left ventricular ejection fraction, by estimation, is 50 to 55%. The left ventricle has low  normal function. The left ventricle demonstrates regional wall motion abnormalities. Definity contrast agent was given IV to delineate the left ventricular endocardial borders. The left ventricular internal cavity size was normal in size. There is no left ventricular hypertrophy. Left ventricular diastolic parameters are consistent with Grade I diastolic dysfunction (impaired relaxation).   LV Wall Scoring: The mid and distal anterior septum, mid and distal inferior wall, and mid inferolateral segment are hypokinetic.  Right Ventricle: The right ventricular size is normal. No increase in right ventricular wall thickness. Right ventricular systolic function is normal.  Left Atrium: Left atrial size was normal in size.  Right Atrium: Right atrial size was normal in size.  Pericardium: There is no evidence of pericardial effusion.  Mitral Valve: The mitral valve is degenerative in appearance. There is mild calcification of the mitral valve leaflet(s). Mild mitral annular  calcification. Trivial mitral valve regurgitation. No evidence of mitral valve stenosis.  Tricuspid Valve: The tricuspid valve is normal in structure. Tricuspid valve regurgitation is trivial. No evidence of tricuspid stenosis.  Aortic Valve: The aortic valve is tricuspid. There is moderate calcification of the aortic valve. Aortic valve regurgitation is not visualized. No aortic stenosis is present.  Pulmonic Valve: The pulmonic valve was normal in structure. Pulmonic valve regurgitation is not visualized. No evidence of pulmonic stenosis.  Aorta: The aortic root is normal in size and structure.  Venous: The inferior vena cava is normal in size with greater than 50% respiratory variability, suggesting right atrial pressure of 3 mmHg.  IAS/Shunts: No atrial level shunt detected by color flow Doppler.   LEFT VENTRICLE PLAX 2D LVIDd:         3.20 cm     Diastology LVIDs:         2.90 cm     LV e' medial:    6.42 cm/s LV PW:         0.80 cm     LV E/e' medial:  13.9 LV IVS:        1.00 cm     LV e' lateral:   8.27 cm/s LVOT diam:     1.70 cm     LV E/e' lateral: 10.8 LV SV:         45 LV SV Index:   23 LVOT Area:     2.27 cm  LV Volumes (MOD) LV vol d, MOD A2C: 63.3 ml LV vol d, MOD A4C: 66.3 ml LV vol s, MOD A2C: 34.6 ml LV vol s, MOD A4C: 27.0 ml LV SV MOD A2C:     28.7 ml LV SV MOD A4C:     66.3 ml LV SV MOD BP:      35.1 ml  RIGHT VENTRICLE RV S prime:     10.80 cm/s TAPSE (M-mode): 1.5 cm  LEFT ATRIUM             Index        RIGHT ATRIUM          Index LA diam:        3.50 cm 1.77 cm/m   RA Area:     7.67 cm LA Vol (A2C):   39.3 ml 19.83 ml/m  RA Volume:   10.70 ml 5.40 ml/m LA Vol (A4C):   56.4 ml 28.46 ml/m LA Biplane Vol: 49.3 ml 24.88 ml/m AORTIC VALVE LVOT Vmax:   90.40 cm/s LVOT Vmean:  61.700 cm/s LVOT VTI:    0.199 m  AORTA Ao Root diam: 3.20 cm Ao  Asc diam:  3.50 cm  MITRAL VALVE MV Area (PHT): 3.46 cm    SHUNTS MV Decel Time: 219 msec     Systemic VTI:  0.20 m MR Peak grad: 31.7 mmHg    Systemic Diam: 1.70 cm MR Vmax:      281.50 cm/s MV E velocity: 89.10 cm/s MV A velocity: 99.40 cm/s MV E/A ratio:  0.90  Arvilla Meres MD Electronically signed by Arvilla Meres MD Signature Date/Time: 09/07/2023/5:56:20 PM    Final   TEE  ECHO INTRAOPERATIVE TEE 09/09/2023  Narrative *INTRAOPERATIVE TRANSESOPHAGEAL REPORT *    Patient Name:   TERRIYAH LINNEBUR Date of Exam: 09/09/2023 Medical Rec #:  [id number]            Height:       64.0 in Accession #:    [id number]           Weight:       199.5 lb Date of Birth:  12-Nov-1956           BSA:          1.95 m Patient Age:    66 years             BP:           138/78 mmHg Patient Gender: F                    HR:           91 bpm. Exam Location:  Anesthesiology  Transesophogeal exam was perform intraoperatively during surgical procedure. Patient was closely monitored under general anesthesia during the entirety of examination.  Indications:     CAD Native Vessel i25.10 Sonographer:     Irving Burton Senior RDCS Performing Phys: [id number] Alleen Borne Diagnosing Phys: Marcene Duos MD  Complications: No known complications during this procedure. POST-OP IMPRESSIONS _ Left Ventricle: The left ventricle is unchanged from pre-bypass. _ Right Ventricle: The right ventricle appears unchanged from pre-bypass. _ Aorta: The aorta appears unchanged from pre-bypass. _ Left Atrium: The left atrium appears unchanged from pre-bypass. _ Left Atrial Appendage: The left atrial appendage appears unchanged from pre-bypass. _ Aortic Valve: The aortic valve appears unchanged from pre-bypass. _ Mitral Valve: The mitral valve appears unchanged from pre-bypass. _ Tricuspid Valve: The tricuspid valve appears unchanged from pre-bypass. _ Pulmonic Valve: The pulmonic valve appears unchanged from pre-bypass. _ Interatrial Septum: The interatrial septum appears unchanged from pre-bypass. _  Interventricular Septum: The interventricular septum appears unchanged from pre-bypass. _ Pericardium: The pericardium appears unchanged from pre-bypass.  PRE-OP FINDINGS Left Ventricle: The left ventricle has normal systolic function, with an ejection fraction of 60-65%. The cavity size was normal. There is no left ventricular hypertrophy.   Right Ventricle: The right ventricle has normal systolic function. The cavity was normal. There is no increase in right ventricular wall thickness.  Left Atrium: Left atrial size was normal in size. No left atrial/left atrial appendage thrombus was detected.  Right Atrium: Right atrial size was normal in size.  Interatrial Septum: No atrial level shunt detected by color flow Doppler.  Pericardium: There is no evidence of pericardial effusion.  Mitral Valve: The mitral valve is normal in structure. Mitral valve regurgitation is trivial by color flow Doppler. There is No evidence of mitral stenosis.  Tricuspid Valve: The tricuspid valve was normal in structure. Tricuspid valve regurgitation was not visualized by color flow Doppler.  Aortic Valve: The aortic valve is tricuspid Aortic valve regurgitation is  trivial by color flow Doppler. There is no stenosis of the aortic valve. There is moderate thickening present on the aortic valve right coronary, left coronary and non-coronary cusps.  Pulmonic Valve: The pulmonic valve was normal in structure. Pulmonic valve regurgitation is trivial by color flow Doppler.   Aorta: The aortic root, ascending aorta and aortic arch are normal in size and structure.  +--------------+--------++ LEFT VENTRICLE         +--------------+--------++ PLAX 2D                +--------------+--------++ LVIDd:        4.60 cm  +--------------+--------++ LVIDs:        3.00 cm  +--------------+--------++ LVOT diam:    1.90 cm  +--------------+--------++ LV SV:        62 ml     +--------------+--------++ LV SV Index:  30.27    +--------------+--------++ LVOT Area:    2.84 cm +--------------+--------++                        +--------------+--------++  +-------------+-----------++ AORTIC VALVE             +-------------+-----------++ AV Vmax:     126.00 cm/s +-------------+-----------++ AV Vmean:    76.400 cm/s +-------------+-----------++ AV VTI:      0.221 m     +-------------+-----------++ AV Peak Grad:6.4 mmHg    +-------------+-----------++ AV Mean Grad:3.0 mmHg    +-------------+-----------++   +--------------+-------+ SHUNTS                +--------------+-------+ Systemic Diam:1.90 cm +--------------+-------+   Marcene Duos MD Electronically signed by Marcene Duos MD Signature Date/Time: 09/09/2023/4:52:52 PM    Final             Current Reported Medications:.    Current Meds  Medication Sig   acetaminophen (TYLENOL) 500 MG tablet Take 1-2 tablets (500-1,000 mg total) by mouth every 6 (six) hours as needed.   amLODipine (NORVASC) 2.5 MG tablet Take 2.5 mg by mouth daily.   aspirin EC 81 MG tablet Take 1 tablet (81 mg total) by mouth daily.   atorvastatin (LIPITOR) 80 MG tablet Take 80 mg by mouth daily.     beclomethasone (QVAR REDIHALER) 40 MCG/ACT inhaler Inhale 1 puff into the lungs daily.   Cholecalciferol (VITAMIN D3) 50 MCG (2000 UT) capsule Take 2,000 Units by mouth daily.   Continuous Glucose Sensor (GUARDIAN 4 GLUCOSE SENSOR) MISC Place 1 Application onto the skin See admin instructions. Every 6 days   fexofenadine (ALLEGRA) 180 MG tablet Take 180 mg by mouth daily.   fluticasone (FLONASE ALLERGY RELIEF) 50 MCG/ACT nasal spray Place 1 spray into both nostrils daily.   HUMALOG 100 UNIT/ML injection Inject 77 Units into the skin once.   Insulin Human (INSULIN PUMP) 100 unit/ml SOLN Inject into the skin continuous. Humalog insulin pump basal rate 1200 am to 300 am =  1.0 600 am to 600 pm = 0.90   Insulin Infusion Pump Supplies (MINIMED MIO ADVANCE INFUSE SET) MISC 1 Application by Intra-arterial Infusion route See admin instructions. Every three days   Multiple Vitamin (MULTIVITAMIN WITH MINERALS) TABS tablet Take 1 tablet by mouth daily.   omeprazole (PRILOSEC) 20 MG capsule 1 capsule 30 minutes before morning meal   oxybutynin (DITROPAN-XL) 10 MG 24 hr tablet Take 10 mg by mouth daily.   oxyCODONE (OXY IR/ROXICODONE) 5 MG immediate release tablet Take 1 tablet (5 mg total) by mouth every 4 (four)  hours as needed for severe pain.   valsartan (DIOVAN) 320 MG tablet Take 320 mg by mouth daily.   [DISCONTINUED] clopidogrel (PLAVIX) 75 MG tablet Take 1 tablet (75 mg total) by mouth daily.   [DISCONTINUED] metoprolol tartrate (LOPRESSOR) 25 MG tablet Take 1 tablet (25 mg total) by mouth 2 (two) times daily.   Physical Exam:    VS:  BP 118/68   Pulse 83   Ht 5\' 4"  (1.626 m)   Wt 200 lb (90.7 kg)   SpO2 93%   BMI 34.33 kg/m    Wt Readings from Last 3 Encounters:  10/03/23 200 lb (90.7 kg)  09/14/23 202 lb 3.2 oz (91.7 kg)  06/15/20 215 lb (97.5 kg)    GEN: Well nourished, well developed in no acute distress NECK: No JVD; No carotid bruits CARDIAC: RRR, no murmurs, rubs, gallops. MSI and chest tube sites clean and intact, no evidence of infection  RESPIRATORY:  Clear to auscultation without rales, wheezing or rhonchi  ABDOMEN: Soft, non-tender, non-distended EXTREMITIES:  No edema; No acute deformity   Asessement and Plan:.    CAD: s/p CABG x2 with LIMA to LAD and SVG to OM on 09/09/23. Today she denies anginal symptoms, she has been increasing her activity, tolerating well. Her surgical incisions are clean, dry and intact, no evidence of infection. She has follow up with [facility] on 10/05/23. Will defer approval to start cardiac rehab to their service. Continue 81 mg aspirin, Plavix 75 mg daily, lipitor 80 mg daily and Lopressor 25 mg twice daily. Check  CBC and BMET today.   Hyperlipidemia: Last lipid profile on 09/07/2023 indicated total cholesterol 137, triglycerides 50, HDL 50, LDL 77, LP (a) 182. She has been taking atorvastatin 80 mg daily, even prior to admission, given CABG and diabetes history her LDL goal is less than 55. Will refer to lipid clinic for consideration of PCSK9 inhibitor.   Chronic diastolic dysfunction: Felt to initially have acute HFpEF felt secondary to MI, at time of cath LVEDP was 20 mmHg.  Follow-up echocardiogram showed a low normal LVEF of 50 to 55%. Today she appears euvolemic and well compensated on exam. Continue GDMT consisting of Valsartan 320 daily, Lopressor 25 mg daily and 2.5 mg amlodipine.  Type 1 diabetes mellitus: Last hemoglobin A1c 7.0%.  Monitored and managed per PCP.    Cardiac Rehabilitation Eligibility Assessment  The patient is ready to start cardiac rehabilitation pending clearance from the cardiac surgeon.    Disposition: F/u with Reather Littler, NP in 3 months.   Signed, Rip Harbour, NP

## 2023-10-03 ENCOUNTER — Encounter: Payer: Self-pay | Admitting: Physician Assistant

## 2023-10-03 ENCOUNTER — Ambulatory Visit: Payer: Medicare Other | Attending: Physician Assistant | Admitting: Cardiology

## 2023-10-03 VITALS — BP 118/68 | HR 83 | Ht 64.0 in | Wt 200.0 lb

## 2023-10-03 DIAGNOSIS — E785 Hyperlipidemia, unspecified: Secondary | ICD-10-CM

## 2023-10-03 DIAGNOSIS — E1065 Type 1 diabetes mellitus with hyperglycemia: Secondary | ICD-10-CM | POA: Diagnosis not present

## 2023-10-03 DIAGNOSIS — I2510 Atherosclerotic heart disease of native coronary artery without angina pectoris: Secondary | ICD-10-CM

## 2023-10-03 DIAGNOSIS — I10 Essential (primary) hypertension: Secondary | ICD-10-CM

## 2023-10-03 DIAGNOSIS — Z951 Presence of aortocoronary bypass graft: Secondary | ICD-10-CM | POA: Diagnosis not present

## 2023-10-03 MED ORDER — METOPROLOL TARTRATE 25 MG PO TABS: 25.0000 mg | ORAL_TABLET | Freq: Two times a day (BID) | ORAL | 1 refills | Status: DC

## 2023-10-03 MED ORDER — CLOPIDOGREL BISULFATE 75 MG PO TABS: 75.0000 mg | ORAL_TABLET | Freq: Every day | ORAL | 1 refills | Status: DC

## 2023-10-03 NOTE — Patient Instructions (Signed)
Medication Instructions:  The current medical regimen is effective;  continue present plan and medications as directed. Please refer to the Current Medication list given to you today.  *If you need a refill on your cardiac medications before your next appointment, please call your pharmacy*  Lab Work: CBC AND BMET TODAY If you have labs (blood work) drawn today and your tests are completely normal, you will receive your results only by:   MyChart Message (if you have MyChart) OR  A paper copy in the mail If you have any lab test that is abnormal or we need to change your treatment, we will call you to review the results.  Testing/Procedures: REFER TO [facility]  Follow-Up: At [facility], you and your health needs are our priority.  As part of our continuing mission to provide you with exceptional heart care, we have created designated Provider Care Teams.  These Care Teams include your primary Cardiologist (physician) and Advanced Practice Providers (APPs -  Physician Assistants and Nurse Practitioners) who all work together to provide you with the care you need, when you need it.  Your next appointment:   3 month(s)  Provider:   Reatha Harps, MD  or Reather Littler, NP        Other Instructions

## 2023-10-04 ENCOUNTER — Encounter: Payer: Self-pay | Admitting: Cardiology

## 2023-10-04 ENCOUNTER — Other Ambulatory Visit: Payer: Self-pay | Admitting: Surgery

## 2023-10-04 DIAGNOSIS — Z951 Presence of aortocoronary bypass graft: Secondary | ICD-10-CM

## 2023-10-04 LAB — BASIC METABOLIC PANEL
BUN/Creatinine Ratio: 16 (ref 12–28)
BUN: 11 mg/dL (ref 8–27)
CO2: 26 mmol/L (ref 20–29)
Calcium: 9.8 mg/dL (ref 8.7–10.3)
Chloride: 103 mmol/L (ref 96–106)
Creatinine, Ser: 0.7 mg/dL (ref 0.57–1.00)
Glucose: 131 mg/dL — ABNORMAL HIGH (ref 70–99)
Potassium: 4.8 mmol/L (ref 3.5–5.2)
Sodium: 144 mmol/L (ref 134–144)
eGFR: 95 mL/min/{1.73_m2} (ref >59)

## 2023-10-04 LAB — CBC
Hematocrit: 43.3 % (ref 34.0–46.6)
Hemoglobin: 14.1 g/dL (ref 11.1–15.9)
MCH: 29.2 pg (ref 26.6–33.0)
MCHC: 32.6 g/dL (ref 31.5–35.7)
MCV: 90 fL (ref 79–97)
Platelets: 339 10*3/uL (ref 150–450)
RBC: 4.83 x10E6/uL (ref 3.77–5.28)
RDW: 12.7 % (ref 11.7–15.4)
WBC: 7 10*3/uL (ref 3.4–10.8)

## 2023-10-05 ENCOUNTER — Ambulatory Visit (INDEPENDENT_AMBULATORY_CARE_PROVIDER_SITE_OTHER): Payer: Self-pay | Admitting: Surgical

## 2023-10-05 ENCOUNTER — Ambulatory Visit
Admission: RE | Admit: 2023-10-05 | Discharge: 2023-10-05 | Disposition: A | Discharge status: Home / Self Care | Payer: Medicare Other | Source: Ambulatory Visit | Attending: Surgery | Admitting: Surgery

## 2023-10-05 VITALS — BP 131/81 | HR 103 | Resp 18 | Ht 64.0 in | Wt 200.0 lb

## 2023-10-05 DIAGNOSIS — Z951 Presence of aortocoronary bypass graft: Secondary | ICD-10-CM

## 2023-10-05 NOTE — Patient Instructions (Signed)
Discussed activity progression including driving and lifting restrictions 

## 2023-10-05 NOTE — Progress Notes (Signed)
[address]       [place] [address]             [phone]      Kerry Compton [facility] Medical Record #[id number] Date of Birth: [birth year]  Referring: Sande Rives, * Primary Care: Laurann Montana, MD Primary Cardiologist: Reatha Harps, MD   Chief Complaint:   POST OP FOLLOW UP                                                                                                CARDIOVASCULAR SURGERY OPERATIVE NOTE   09/09/2023   Surgeon:  Alleen Borne, MD   First Assistant: Aloha Gell,  PA-C:      Preoperative Diagnosis:  Severe multi-vessel coronary artery disease     Postoperative Diagnosis:  Same     Procedure:   Median Sternotomy Extracorporeal circulation 3.   Coronary artery bypass grafting x 2   Left internal mammary artery graft to the LAD SVG to OM     4.   Endoscopic vein harvest from the right leg     Anesthesia:  General Endotracheal         History of Present Illness:    Patient is a 67 year old female seen in the office on today's date and routine postsurgical follow-up status post the above described procedure.  She reports that she is doing well.  She is tolerating routine activities without difficulty.  She denies anginal pain/equivalents.  Is not having any palpitations or lower extremity edema.  Had no difficulties with her incisions.  She denies fevers, chills or other significant constitutional symptoms.  Overall she is quite pleased with her progress.      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     amLODipine (NORVASC) 2.5 MG tablet Take 2.5 mg by mouth daily.     aspirin EC 81 MG tablet Take 1 tablet (81 mg total) by mouth daily.     atorvastatin (LIPITOR) 80 MG tablet Take 80 mg by mouth daily.       beclomethasone (QVAR REDIHALER) 40 MCG/ACT inhaler Inhale 1 puff into the lungs daily.     Cholecalciferol (VITAMIN D3) 50 MCG (2000 UT) capsule Take 2,000 Units by mouth daily.  clopidogrel (PLAVIX) 75 MG tablet Take 1 tablet (75 mg total) by mouth daily. 90 tablet 1   Continuous Glucose Sensor (GUARDIAN 4 GLUCOSE SENSOR) MISC Place 1 Application onto the skin See admin instructions. Every 6 days     fexofenadine (ALLEGRA) 180 MG tablet Take 180 mg by mouth daily.     fluticasone (FLONASE ALLERGY RELIEF) 50 MCG/ACT nasal spray Place 1 spray into both nostrils daily.     HUMALOG 100 UNIT/ML injection Inject 77 Units into the skin once.     Insulin Human (INSULIN PUMP) 100 unit/ml SOLN Inject into the skin continuous. Humalog insulin pump basal rate 1200 am to 300 am = 1.0 600 am to 600 pm = 0.90     Insulin Infusion Pump Supplies (MINIMED MIO ADVANCE INFUSE SET) MISC 1 Application by Intra-arterial Infusion route See admin instructions. Every three days     metoprolol tartrate (LOPRESSOR) 25 MG tablet Take 1 tablet (25 mg total) by mouth 2 (two) times daily. 180 tablet 1   Multiple Vitamin (MULTIVITAMIN WITH MINERALS) TABS tablet Take 1 tablet by mouth daily.     omeprazole (PRILOSEC) 20 MG capsule 1 capsule 30 minutes before morning meal     oxybutynin (DITROPAN-XL) 10 MG 24 hr tablet Take 10 mg by mouth daily.     oxyCODONE (OXY IR/ROXICODONE) 5 MG immediate release tablet Take 1  tablet (5 mg total) by mouth every 4 (four) hours as needed for severe pain. 30 tablet 0   valsartan (DIOVAN) 320 MG tablet Take 320 mg by mouth daily.     No current facility-administered medications for this visit.       Physical Exam: BP 131/81   Pulse (!) 103   Resp 18   Ht 5\' 4"  (1.626 m)   Wt 200 lb (90.7 kg)   SpO2 96% Comment: RA  BMI 34.33 kg/m   General appearance: alert, cooperative, and no distress Heart: regular rate and rhythm Lungs: clear to auscultation bilaterally Abdomen: Benign exam Extremities: No edema Wound: Incisions healing well without evidence of infection.   Diagnostic Studies & Laboratory data:     Recent Radiology Findings:   No results found.    Recent Lab Findings: Lab Results  Component Value Date   WBC 7.0 10/03/2023   HGB 14.1 10/03/2023   HCT 43.3 10/03/2023   PLT 339 10/03/2023   GLUCOSE 131 (H) 10/03/2023   CHOL 137 09/07/2023   TRIG 50 09/07/2023   HDL 50 09/07/2023   LDLCALC 77 09/07/2023   ALT 27 09/08/2023   AST 25 09/08/2023   NA 144 10/03/2023   K 4.8 10/03/2023   CL 103 10/03/2023   CREATININE 0.70 10/03/2023   BUN 11 10/03/2023   CO2 26 10/03/2023   TSH 0.967 09/07/2023   INR 1.3 (H) 09/09/2023   HGBA1C 7.0 (H) 09/07/2023      Assessment / Plan: Patient continues to do quite well in her postsurgical recovery.  There are no current worrisome issues.  I reviewed her chest x-ray and it is without concerning findings.  I did not make any changes to her current medication regimen.  I did continue to recommend that she keep a blood pressure diary which she is doing.  She has seen cardiology and doing well from their perspective.  We will see the patient again on a as needed basis for any surgically related needs or at request.      Medication Changes: No orders of the defined types were  placed in this encounter.     Rowe Clack, PA-C  10/05/2023 12:48 PM

## 2023-10-06 ENCOUNTER — Other Ambulatory Visit (HOSPITAL_COMMUNITY): Payer: Self-pay

## 2023-10-06 ENCOUNTER — Telehealth: Payer: Self-pay | Admitting: Pharmacist

## 2023-10-06 ENCOUNTER — Telehealth (HOSPITAL_COMMUNITY): Payer: Self-pay

## 2023-10-06 ENCOUNTER — Ambulatory Visit: Payer: Medicare Other | Attending: Cardiology | Admitting: Pharmacist

## 2023-10-06 ENCOUNTER — Encounter: Payer: Self-pay | Admitting: Pharmacist

## 2023-10-06 ENCOUNTER — Encounter (HOSPITAL_COMMUNITY): Payer: Self-pay

## 2023-10-06 DIAGNOSIS — E785 Hyperlipidemia, unspecified: Secondary | ICD-10-CM | POA: Diagnosis not present

## 2023-10-06 DIAGNOSIS — Z951 Presence of aortocoronary bypass graft: Secondary | ICD-10-CM | POA: Diagnosis not present

## 2023-10-06 DIAGNOSIS — I2510 Atherosclerotic heart disease of native coronary artery without angina pectoris: Secondary | ICD-10-CM | POA: Diagnosis not present

## 2023-10-06 DIAGNOSIS — E1065 Type 1 diabetes mellitus with hyperglycemia: Secondary | ICD-10-CM | POA: Diagnosis not present

## 2023-10-06 NOTE — Progress Notes (Signed)
Patient ID: Kerry Compton                 DOB: June 07, 1956                    MRN: [id number]     HPI: Kerry Compton is a 67 y.o. female patient referred to lipid clinic by [facility].  Patient last saw Dr Antoine Poche in 2021. PMH is significant for HTN, NSTEMI, CAD, T1DM, and CABG x 2.  Patient admitted on 09/06/23 for NSTEMI. Underwent CABG x 2 utilizing LIMA to LAD and SVG to OM. Has not started rehab yet.  Currently managed on atorvastatin 80mg  once daily. No patient reported adverse effects.  Presents today feeling well. Denies chest pain and shortness of breath. Reports blood pressure and blood sugar has been well controlled.   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     amLODipine (NORVASC) 2.5 MG tablet Take 2.5 mg by mouth daily.     aspirin EC 81 MG tablet Take 1 tablet (81 mg total) by mouth daily.     atorvastatin (LIPITOR) 80 MG tablet Take 80 mg by mouth daily.       beclomethasone (QVAR REDIHALER) 40 MCG/ACT inhaler Inhale 1 puff into the lungs daily.     Cholecalciferol (VITAMIN D3) 50 MCG (2000 UT) capsule Take 2,000 Units by mouth daily.     clopidogrel (PLAVIX) 75 MG tablet Take 1 tablet (75 mg total) by mouth daily. 90 tablet 1   Continuous Glucose Sensor (GUARDIAN 4 GLUCOSE SENSOR) MISC Place 1 Application onto the skin See admin instructions. Every 6 days     fexofenadine (ALLEGRA) 180 MG tablet Take 180 mg by mouth  daily.     fluticasone (FLONASE ALLERGY RELIEF) 50 MCG/ACT nasal spray Place 1 spray into both nostrils daily.     HUMALOG 100 UNIT/ML injection Inject 77 Units into the skin once.     Insulin Human (INSULIN PUMP) 100 unit/ml SOLN Inject into the skin continuous. Humalog insulin pump basal rate 1200 am to 300 am = 1.0 600 am to 600 pm = 0.90     Insulin Infusion Pump Supplies (MINIMED MIO ADVANCE INFUSE SET) MISC 1 Application by Intra-arterial Infusion route See admin instructions. Every three days     metoprolol tartrate (LOPRESSOR) 25 MG tablet Take 1 tablet (25 mg total) by mouth 2 (two) times daily. 180 tablet 1   Multiple Vitamin (MULTIVITAMIN WITH MINERALS) TABS tablet Take 1 tablet by mouth daily.     omeprazole (PRILOSEC) 20 MG capsule 1 capsule 30 minutes before morning meal     oxybutynin (DITROPAN-XL) 10 MG 24 hr tablet Take 10 mg by mouth daily.     oxyCODONE (OXY IR/ROXICODONE) 5 MG immediate release tablet Take 1 tablet (5 mg total) by mouth every 4 (four) hours as needed for  severe pain. 30 tablet 0   valsartan (DIOVAN) 320 MG tablet Take 320 mg by mouth daily.     No current facility-administered medications on file prior to visit.    Allergies  Allergen Reactions   Azithromycin Diarrhea and Nausea And Vomiting   Indocin [Indomethacin] Other (See Comments)    Light headed and loopy     Assessment/Plan:  1. Hyperlipidemia - Patient last LDL 77 which is above goal of <55. Due to CAD, recommend adding [id number] to regimen. Patient is agreeable.  Using demo pen, educated patient on mechanism of action, storage, site selection, administration and possible adverse effects. Patient voiced understanding and confidence in self administering. Will complete PA and contact patient when approved. Recheck lipid panel in 3 months.  Continue atorvastatin 80mg  daily Start Repatha 140mg  q 2 weeks Recheck lipid panel in 3 months  Laural Golden, PharmD, BCACP, CDCES, CPP [address], [address] [place], [place], [address] Phone: [phone], Fax: [phone]

## 2023-10-06 NOTE — Telephone Encounter (Signed)
Please complete PA for Repatha 

## 2023-10-06 NOTE — Patient Instructions (Addendum)
It was nice meeting you today  We would like your LDL (bad cholesterol) to be less than 55  Please continue your atorvastatin 80mg  daily  The medication we discussed today is called Repatha. You will inject one pen once every 2 weeks  I will complete the prior authorization for you and contact you when it is approved  Once your start the medication we will recheck your fasting lipid panel in about 3 months  Please call or message with any questions or concerns  Laural Golden, PharmD, BCACP, CDCES, CPP [address] [place], [place], [address] Phone: [phone], Fax: [phone]

## 2023-10-06 NOTE — Telephone Encounter (Signed)
Called patient to see if she was interested in participating in the Cardiac Rehab Program. Patient stated yes. Patient will come in for orientation on 10/29@1030  and will attend the 1:45 exercise class.   Pensions consultant.

## 2023-10-07 MED ORDER — REPATHA SURECLICK 140 MG/ML ~~LOC~~ SOAJ
1.0000 mL | SUBCUTANEOUS | 1 refills | Status: DC
Start: 2023-10-07 — End: 2024-03-12

## 2023-10-10 ENCOUNTER — Telehealth (HOSPITAL_COMMUNITY): Payer: Self-pay

## 2023-10-10 NOTE — Telephone Encounter (Signed)
    Called patient to confirm Cardiac Rehab appointment tomorrow. RN completed Nursing Assessment with patient. Pt denies s/s of COVID. Directions and instructions provided for the appointment. Pt understands without assistance.

## 2023-10-11 ENCOUNTER — Encounter (HOSPITAL_COMMUNITY)
Admission: RE | Admit: 2023-10-11 | Discharge: 2023-10-11 | Disposition: A | Discharge status: Home / Self Care | Payer: Medicare Other | Source: Ambulatory Visit | Attending: Cardiology | Admitting: Cardiology

## 2023-10-11 VITALS — BP 110/62 | HR 75 | Ht 64.25 in | Wt 200.8 lb

## 2023-10-11 DIAGNOSIS — I252 Old myocardial infarction: Secondary | ICD-10-CM | POA: Insufficient documentation

## 2023-10-11 DIAGNOSIS — I214 Non-ST elevation (NSTEMI) myocardial infarction: Secondary | ICD-10-CM

## 2023-10-11 DIAGNOSIS — Z48812 Encounter for surgical aftercare following surgery on the circulatory system: Secondary | ICD-10-CM | POA: Diagnosis not present

## 2023-10-11 DIAGNOSIS — Z951 Presence of aortocoronary bypass graft: Secondary | ICD-10-CM | POA: Diagnosis not present

## 2023-10-11 NOTE — Progress Notes (Signed)
Cardiac Individual Treatment Plan  Patient Details  Name: Kerry Compton MRN: [id number] Date of Birth: 1956/07/03 Referring Provider:   Flowsheet Row INTENSIVE CARDIAC REHAB ORIENT from 10/11/2023 in [facility]  Referring Provider O'Neal, Ronnald Ramp, MD       Initial Encounter Date:  Flowsheet Row INTENSIVE CARDIAC REHAB ORIENT from 10/11/2023 in [facility]  Date 10/11/23       Visit Diagnosis: 09/06/23 NSTEMI  09/09/23 CABG x 2  Patient's Home Medications on Admission:  Current Outpatient Medications:    acetaminophen (TYLENOL) 500 MG tablet, Take 1-2 tablets (500-1,000 mg total) by mouth every 6 (six) hours as needed., Disp: , Rfl:    amLODipine (NORVASC) 2.5 MG tablet, Take 2.5 mg by mouth daily., Disp: , Rfl:    aspirin EC 81 MG tablet, Take 1 tablet (81 mg total) by mouth daily., Disp: , Rfl:    atorvastatin (LIPITOR) 80 MG tablet, Take 80 mg by mouth daily.  , Disp: , Rfl:    beclomethasone (QVAR REDIHALER) 40 MCG/ACT inhaler, Inhale 1 puff into the lungs daily., Disp: , Rfl:    Cholecalciferol (VITAMIN D3) 50 MCG (2000 UT) capsule, Take 2,000 Units by mouth daily., Disp: , Rfl:    clopidogrel (PLAVIX) 75 MG tablet, Take 1 tablet (75 mg total) by mouth daily., Disp: 90 tablet, Rfl: 1   Continuous Glucose Sensor (GUARDIAN 4 GLUCOSE SENSOR) MISC, Place 1 Application onto the skin See admin instructions. Every 6 days, Disp: , Rfl:    fexofenadine (ALLEGRA) 180 MG tablet, Take 180 mg by mouth daily., Disp: , Rfl:    fluticasone (FLONASE ALLERGY RELIEF) 50 MCG/ACT nasal spray, Place 1 spray into both nostrils daily., Disp: , Rfl:    HUMALOG 100 UNIT/ML injection, Inject 77 Units into the skin once., Disp: , Rfl:    Insulin Human (INSULIN PUMP) 100 unit/ml SOLN, Inject into the skin continuous. Humalog insulin pump basal rate 1200 am to 300 am = 1.0 600  am to 600 pm = 0.90, Disp: , Rfl:    Insulin Infusion Pump Supplies (MINIMED MIO ADVANCE INFUSE SET) MISC, 1 Application by Intra-arterial Infusion route See admin instructions. Every three days, Disp: , Rfl:    metoprolol tartrate (LOPRESSOR) 25 MG tablet, Take 1 tablet (25 mg total) by mouth 2 (two) times daily., Disp: 180 tablet, Rfl: 1   Multiple Vitamin (MULTIVITAMIN WITH MINERALS) TABS tablet, Take 1 tablet by mouth daily., Disp: , Rfl:    omeprazole (PRILOSEC) 20 MG capsule, 1 capsule 30 minutes before morning meal, Disp: , Rfl:    oxybutynin (DITROPAN-XL) 10 MG 24 hr tablet, Take 10 mg by mouth daily., Disp: , Rfl:    valsartan (DIOVAN) 320 MG tablet, Take 320 mg by mouth daily., Disp: , Rfl:    Evolocumab (REPATHA SURECLICK) 140 MG/ML SOAJ, Inject 140 mg into the skin every 14 (fourteen) days. (Patient not taking: Reported on 10/11/2023), Disp: 6 mL, Rfl: 1   oxyCODONE (OXY IR/ROXICODONE) 5 MG immediate release tablet, Take 1 tablet (5 mg total) by mouth every 4 (four) hours as needed for severe pain. 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, no current liver problems  Hyperlipemia    Hypertension     Tobacco Use: Social History   Tobacco Use  Smoking Status Never  Smokeless Tobacco Never    Labs: Review Flowsheet  More data exists      Latest Ref Rng & Units 02/09/2015 08/27/2015 09/07/2023 09/08/2023 09/09/2023  Labs for ITP Cardiac and Pulmonary Rehab  Cholestrol 0 - 200 mg/dL - - 952  - -  LDL (calc) 0 - 99 mg/dL - - 77  - -  HDL-C >84 mg/dL - - 50  - -  Trlycerides <150 mg/dL - - 50  - -  Hemoglobin A1c 4.8 - 5.6 % 7.6  - 7.0  - -  PH, Arterial 7.35 - 7.45 - - - 7.444  7.304  7.319  7.327  7.451   PCO2 arterial 32 - 48 mmHg - - - 37.1  43.0  44.8  44.8  34.6   Bicarbonate 20.0 - 28.0 mmol/L -  18.9  - 25.5  21.4  23.2  23.6  23.9  24.1   TCO2 22 - 32 mmol/L - 20  - 27  23  25  25  24  26  25  25  23  28    Acid-base deficit 0.0 - 2.0 mmol/L - 8.0  - - 5.0  3.0  3.0   O2 Saturation % - 43.0  - 94  96  94  94  80  100     Details       Multiple values from one day are sorted in reverse-chronological order         Capillary Blood Glucose: Lab Results  Component Value Date   GLUCAP 221 (H) 09/14/2023   GLUCAP 97 09/13/2023   GLUCAP 325 (H) 09/13/2023   GLUCAP 290 (H) 09/13/2023   GLUCAP 122 (H) 09/13/2023     Exercise Target Goals: Exercise Program Goal: Individual exercise prescription set using results from initial 6 min walk test and THRR while considering  patient's activity barriers and safety.   Exercise Prescription Goal: Initial exercise prescription builds to 30-45 minutes a day of aerobic activity, 2-3 days per week.  Home exercise guidelines will be given to patient during program as part of exercise prescription that the participant will acknowledge.  Activity Barriers & Risk Stratification:  Activity Barriers & Cardiac Risk Stratification - 10/11/23 1038       Activity Barriers & Cardiac Risk Stratification   Activity Barriers None    Cardiac Risk Stratification High             6 Minute Walk:  6 Minute Walk     Row Name 10/11/23 1155         6 Minute Walk   Phase Initial     Distance 1374 feet     Walk Time 6 minutes     # of Rest Breaks 0     MPH 2.6     METS 2.83     RPE 9     Perceived Dyspnea  0     VO2 Peak 9.9     Symptoms No     Resting HR 75 bpm     Resting BP 110/62     Resting Oxygen Saturation  96 %     Exercise Oxygen Saturation  during 6 min walk 98 %     Max Ex. HR 94 bpm     Max Ex. BP 138/62     2 Minute Post BP 128/70  Oxygen Initial Assessment:   Oxygen Re-Evaluation:   Oxygen Discharge (Final Oxygen Re-Evaluation):   Initial Exercise Prescription:  Initial Exercise Prescription -  10/11/23 1300       Date of Initial Exercise RX and Referring Provider   Date 10/11/23    Referring Provider O'Neal, Ronnald Ramp, MD    Expected Discharge Date 01/04/24      Treadmill   MPH 2.2    Grade 0    Minutes 15    METs 2.68      NuStep   Level 1    SPM 85    Minutes 15    METs 2.5      Prescription Details   Frequency (times per week) 3    Duration Progress to 30 minutes of continuous aerobic without signs/symptoms of physical distress      Intensity   THRR 40-80% of Max Heartrate 62-123    Ratings of Perceived Exertion 11-13    Perceived Dyspnea 0-4      Progression   Progression Continue to progress workloads to maintain intensity without signs/symptoms of physical distress.      Resistance Training   Training Prescription Yes    Weight 2 lbs    Reps 10-15             Perform Capillary Blood Glucose checks as needed.  Exercise Prescription Changes:   Exercise Comments:   Exercise Goals and Review:   Exercise Goals     Row Name 10/11/23 1038             Exercise Goals   Increase Physical Activity Yes       Intervention Provide advice, education, support and counseling about physical activity/exercise needs.;Develop an individualized exercise prescription for aerobic and resistive training based on initial evaluation findings, risk stratification, comorbidities and participant's personal goals.       Expected Outcomes Short Term: Attend rehab on a regular basis to increase amount of physical activity.;Long Term: Add in home exercise to make exercise part of routine and to increase amount of physical activity.;Long Term: Exercising regularly at least 3-5 days a week.       Increase Strength and Stamina Yes       Intervention Provide advice, education, support and counseling about physical activity/exercise needs.;Develop an individualized exercise prescription for aerobic and resistive training based on initial evaluation findings, risk  stratification, comorbidities and participant's personal goals.       Expected Outcomes Short Term: Increase workloads from initial exercise prescription for resistance, speed, and METs.;Short Term: Perform resistance training exercises routinely during rehab and add in resistance training at home;Long Term: Improve cardiorespiratory fitness, muscular endurance and strength as measured by increased METs and functional capacity ( )       Able to understand and use rate of perceived exertion (RPE) scale Yes       Intervention Provide education and explanation on how to use RPE scale       Expected Outcomes Short Term: Able to use RPE daily in rehab to express subjective intensity level;Long Term:  Able to use RPE to guide intensity level when exercising independently       Knowledge and understanding of Target Heart Rate Range (THRR) Yes       Intervention Provide education and explanation of THRR including how the numbers were predicted and where they are located for reference       Expected Outcomes Short Term: Able to state/look up THRR;Long Term: Able to use THRR  to govern intensity when exercising independently;Short Term: Able to use daily as guideline for intensity in rehab       Able to check pulse independently Yes       Intervention Provide education and demonstration on how to check pulse in carotid and radial arteries.;Review the importance of being able to check your own pulse for safety during independent exercise       Expected Outcomes Short Term: Able to explain why pulse checking is important during independent exercise;Long Term: Able to check pulse independently and accurately       Understanding of Exercise Prescription Yes       Intervention Provide education, explanation, and written materials on patient's individual exercise prescription       Expected Outcomes Short Term: Able to explain program exercise prescription;Long Term: Able to explain home exercise prescription to  exercise independently                Exercise Goals Re-Evaluation :   Discharge Exercise Prescription (Final Exercise Prescription Changes):   Nutrition:  Target Goals: Understanding of nutrition guidelines, daily intake of sodium 1500mg , cholesterol 200mg , calories 30% from fat and 7% or less from saturated fats, daily to have 5 or more servings of fruits and vegetables.  Biometrics:  Pre Biometrics - 10/11/23 1019       Pre Biometrics   Waist Circumference 40 inches    Hip Circumference 46.5 inches    Waist to Hip Ratio 0.86 %    Triceps Skinfold 35 mm    % Body Fat 45.2 %    Grip Strength 24 kg    Flexibility 15 in    Single Leg Stand 5.18 seconds              Nutrition Therapy Plan and Nutrition Goals:   Nutrition Assessments:  MEDIFICTS Score Key: >=70 Need to make dietary changes  40-70 Heart Healthy Diet <= 40 Therapeutic Level Cholesterol Diet    Picture Your Plate Scores: <09 Unhealthy dietary pattern with much room for improvement. 41-50 Dietary pattern unlikely to meet recommendations for good health and room for improvement. 51-60 More healthful dietary pattern, with some room for improvement.  >60 Healthy dietary pattern, although there may be some specific behaviors that could be improved.    Nutrition Goals Re-Evaluation:   Nutrition Goals Re-Evaluation:   Nutrition Goals Discharge (Final Nutrition Goals Re-Evaluation):   Psychosocial: Target Goals: Acknowledge presence or absence of significant depression and/or stress, maximize coping skills, provide positive support system. Participant is able to verbalize types and ability to use techniques and skills needed for reducing stress and depression.  Initial Review & Psychosocial Screening:  Initial Psych Review & Screening - 10/11/23 1113       Initial Review   Current issues with None Identified      Family Dynamics   Good Support System? Yes    Comments Shaelee has  neighbors, a brother and sister, 2 daughers, 1 raised son, and sister-in-law Music therapist) for support.      Barriers   Psychosocial barriers to participate in program There are no identifiable barriers or psychosocial needs.      Screening Interventions   Interventions Encouraged to exercise;Provide feedback about the scores to participant    Expected Outcomes Short Term goal: Identification and review with participant of any Quality of Life or Depression concerns found by scoring the questionnaire.;Long Term goal: The participant improves quality of Life and PHQ9 Scores as seen by post scores and/or  verbalization of changes             Quality of Life Scores:  Quality of Life - 10/11/23 1320       Quality of Life   Select Quality of Life      Quality of Life Scores   Health/Function Pre 27.47 %    Socioeconomic Pre 27.5 %    Psych/Spiritual Pre 28.93 %    Family Pre 25.5 %    GLOBAL Pre 27.55 %            Scores of 19 and below usually indicate a poorer quality of life in these areas.  A difference of  2-3 points is a clinically meaningful difference.  A difference of 2-3 points in the total score of the Quality of Life Index has been associated with significant improvement in overall quality of life, self-image, physical symptoms, and general health in studies assessing change in quality of life.  PHQ-9: Review Flowsheet       10/11/2023 08/05/2016  Depression screen PHQ 2/9  Decreased Interest 0 0  Down, Depressed, Hopeless 0 0  PHQ - 2 Score 0 0  Altered sleeping 0 -  Tired, decreased energy 0 -  Change in appetite 0 -  Feeling bad or failure about yourself  0 -  Trouble concentrating 0 -  Moving slowly or fidgety/restless 0 -  Suicidal thoughts 0 -  PHQ-9 Score 0 -    Details           Interpretation of Total Score  Total Score Depression Severity:  1-4 = Minimal depression, 5-9 = Mild depression, 10-14 = Moderate depression, 15-19 = Moderately severe  depression, 20-27 = Severe depression   Psychosocial Evaluation and Intervention:   Psychosocial Re-Evaluation:   Psychosocial Discharge (Final Psychosocial Re-Evaluation):   Vocational Rehabilitation: Provide vocational rehab assistance to qualifying candidates.   Vocational Rehab Evaluation & Intervention:  Vocational Rehab - 10/11/23 1115       Initial Vocational Rehab Evaluation & Intervention   Assessment shows need for Vocational Rehabilitation No   Retired. No VR needs.            Education: Education Goals: Education classes will be provided on a weekly basis, covering required topics. Participant will state understanding/return demonstration of topics presented.     Core Videos: Exercise    Move It!  Clinical staff conducted group or individual video education with verbal and written material and guidebook.  Patient learns the recommended Pritikin exercise program. Exercise with the goal of living a long, healthy life. Some of the health benefits of exercise include controlled diabetes, healthier blood pressure levels, improved cholesterol levels, improved heart and lung capacity, improved sleep, and better body composition. Everyone should speak with their doctor before starting or changing an exercise routine.  Biomechanical Limitations Clinical staff conducted group or individual video education with verbal and written material and guidebook.  Patient learns how biomechanical limitations can impact exercise and how we can mitigate and possibly overcome limitations to have an impactful and balanced exercise routine.  Body Composition Clinical staff conducted group or individual video education with verbal and written material and guidebook.  Patient learns that body composition (ratio of muscle mass to fat mass) is a key component to assessing overall fitness, rather than body weight alone. Increased fat mass, especially visceral belly fat, can put [place] at  increased risk for metabolic syndrome, type 2 diabetes, heart disease, and even death. It is recommended to combine  diet and exercise (cardiovascular and resistance training) to improve your body composition. Seek guidance from your physician and exercise physiologist before implementing an exercise routine.  Exercise Action Plan Clinical staff conducted group or individual video education with verbal and written material and guidebook.  Patient learns the recommended strategies to achieve and enjoy long-term exercise adherence, including variety, self-motivation, self-efficacy, and positive decision making. Benefits of exercise include fitness, good health, weight management, more energy, better sleep, less stress, and overall well-being.  Medical   Heart Disease Risk Reduction Clinical staff conducted group or individual video education with verbal and written material and guidebook.  Patient learns our heart is our most vital organ as it circulates oxygen, nutrients, white blood cells, and hormones throughout the entire body, and carries waste away. Data supports a plant-based eating plan like the Pritikin Program for its effectiveness in slowing progression of and reversing heart disease. The video provides a number of recommendations to address heart disease.   Metabolic Syndrome and Belly Fat  Clinical staff conducted group or individual video education with verbal and written material and guidebook.  Patient learns what metabolic syndrome is, how it leads to heart disease, and how one can reverse it and keep it from coming back. You have metabolic syndrome if you have 3 of the following 5 criteria: abdominal obesity, high blood pressure, high triglycerides, low HDL cholesterol, and high blood sugar.  Hypertension and Heart Disease Clinical staff conducted group or individual video education with verbal and written material and guidebook.  Patient learns that high blood pressure, or  hypertension, is very common in the [place]. Hypertension is largely due to excessive salt intake, but other important risk factors include being overweight, physical inactivity, drinking too much alcohol, smoking, and not eating enough potassium from fruits and vegetables. High blood pressure is a leading risk factor for heart attack, stroke, congestive heart failure, dementia, kidney failure, and premature death. Long-term effects of excessive salt intake include stiffening of the arteries and thickening of heart muscle and organ damage. Recommendations include ways to reduce hypertension and the risk of heart disease.  Diseases of Our Time - Focusing on Diabetes Clinical staff conducted group or individual video education with verbal and written material and guidebook.  Patient learns why the best way to stop diseases of our time is prevention, through food and other lifestyle changes. Medicine (such as prescription pills and surgeries) is often only a Band-Aid on the problem, not a long-term solution. Most common diseases of our time include obesity, type 2 diabetes, hypertension, heart disease, and cancer. The Pritikin Program is recommended and has been proven to help reduce, reverse, and/or prevent the damaging effects of metabolic syndrome.  Nutrition   Overview of the Pritikin Eating Plan  Clinical staff conducted group or individual video education with verbal and written material and guidebook.  Patient learns about the Pritikin Eating Plan for disease risk reduction. The Pritikin Eating Plan emphasizes a wide variety of unrefined, minimally-processed carbohydrates, like fruits, vegetables, whole grains, and legumes. Go, Caution, and Stop food choices are explained. Plant-based and lean animal proteins are emphasized. Rationale provided for low sodium intake for blood pressure control, low added sugars for blood sugar stabilization, and low added fats and oils for coronary artery disease  risk reduction and weight management.  Calorie Density  Clinical staff conducted group or individual video education with verbal and written material and guidebook.  Patient learns about calorie density and how it impacts the Pritikin Eating  Plan. Knowing the characteristics of the food you choose will help you decide whether those foods will lead to weight gain or weight loss, and whether you want to consume more or less of them. Weight loss is usually a side effect of the Pritikin Eating Plan because of its focus on low calorie-dense foods.  Label Reading  Clinical staff conducted group or individual video education with verbal and written material and guidebook.  Patient learns about the Pritikin recommended label reading guidelines and corresponding recommendations regarding calorie density, added sugars, sodium content, and whole grains.  Dining Out - Part 1  Clinical staff conducted group or individual video education with verbal and written material and guidebook.  Patient learns that restaurant meals can be sabotaging because they can be so high in calories, fat, sodium, and/or sugar. Patient learns recommended strategies on how to positively address this and avoid unhealthy pitfalls.  Facts on Fats  Clinical staff conducted group or individual video education with verbal and written material and guidebook.  Patient learns that lifestyle modifications can be just as effective, if not more so, as many medications for lowering your risk of heart disease. A Pritikin lifestyle can help to reduce your risk of inflammation and atherosclerosis (cholesterol build-up, or plaque, in the artery walls). Lifestyle interventions such as dietary choices and physical activity address the cause of atherosclerosis. A review of the types of fats and their impact on blood cholesterol levels, along with dietary recommendations to reduce fat intake is also included.  Nutrition Action Plan  Clinical staff  conducted group or individual video education with verbal and written material and guidebook.  Patient learns how to incorporate Pritikin recommendations into their lifestyle. Recommendations include planning and keeping personal health goals in mind as an important part of their success.  Healthy Mind-Set    Healthy Minds, Bodies, Hearts  Clinical staff conducted group or individual video education with verbal and written material and guidebook.  Patient learns how to identify when they are stressed. Video will discuss the impact of that stress, as well as the many benefits of stress management. Patient will also be introduced to stress management techniques. The way we think, act, and feel has an impact on our hearts.  How Our Thoughts Can Heal Our Hearts  Clinical staff conducted group or individual video education with verbal and written material and guidebook.  Patient learns that negative thoughts can cause depression and anxiety. This can result in negative lifestyle behavior and serious health problems. Cognitive behavioral therapy is an effective method to help control our thoughts in order to change and improve our emotional outlook.  Additional Videos:  Exercise    Improving Performance  Clinical staff conducted group or individual video education with verbal and written material and guidebook.  Patient learns to use a non-linear approach by alternating intensity levels and lengths of time spent exercising to help burn more calories and lose more body fat. Cardiovascular exercise helps improve heart health, metabolism, hormonal balance, blood sugar control, and recovery from fatigue. Resistance training improves strength, endurance, balance, coordination, reaction time, metabolism, and muscle mass. Flexibility exercise improves circulation, posture, and balance. Seek guidance from your physician and exercise physiologist before implementing an exercise routine and learn your capabilities  and proper form for all exercise.  Introduction to Yoga  Clinical staff conducted group or individual video education with verbal and written material and guidebook.  Patient learns about yoga, a discipline of the coming together of mind, breath, and body.  The benefits of yoga include improved flexibility, improved range of motion, better posture and core strength, increased lung function, weight loss, and positive self-image. Yoga's heart health benefits include lowered blood pressure, healthier heart rate, decreased cholesterol and triglyceride levels, improved immune function, and reduced stress. Seek guidance from your physician and exercise physiologist before implementing an exercise routine and learn your capabilities and proper form for all exercise.  Medical   Aging: Enhancing Your Quality of Life  Clinical staff conducted group or individual video education with verbal and written material and guidebook.  Patient learns key strategies and recommendations to stay in good physical health and enhance quality of life, such as prevention strategies, having an advocate, securing a Health Care Proxy and Power of Attorney, and keeping a list of medications and system for tracking them. It also discusses how to avoid risk for bone loss.  Biology of Weight Control  Clinical staff conducted group or individual video education with verbal and written material and guidebook.  Patient learns that weight gain occurs because we consume more calories than we burn (eating more, moving less). Even if your body weight is normal, you may have higher ratios of fat compared to muscle mass. Too much body fat puts you at increased risk for cardiovascular disease, heart attack, stroke, type 2 diabetes, and obesity-related cancers. In addition to exercise, following the Pritikin Eating Plan can help reduce your risk.  Decoding Lab Results  Clinical staff conducted group or individual video education with verbal and  written material and guidebook.  Patient learns that lab test reflects one measurement whose values change over time and are influenced by many factors, including medication, stress, sleep, exercise, food, hydration, pre-existing medical conditions, and more. It is recommended to use the knowledge from this video to become more involved with your lab results and evaluate your numbers to speak with your doctor.   Diseases of Our Time - Overview  Clinical staff conducted group or individual video education with verbal and written material and guidebook.  Patient learns that according to the CDC, 50% to 70% of chronic diseases (such as obesity, type 2 diabetes, elevated lipids, hypertension, and heart disease) are avoidable through lifestyle improvements including healthier food choices, listening to satiety cues, and increased physical activity.  Sleep Disorders Clinical staff conducted group or individual video education with verbal and written material and guidebook.  Patient learns how good quality and duration of sleep are important to overall health and well-being. Patient also learns about sleep disorders and how they impact health along with recommendations to address them, including discussing with a physician.  Nutrition  Dining Out - Part 2 Clinical staff conducted group or individual video education with verbal and written material and guidebook.  Patient learns how to plan ahead and communicate in order to maximize their dining experience in a healthy and nutritious manner. Included are recommended food choices based on the type of restaurant the patient is visiting.   Fueling a Banker conducted group or individual video education with verbal and written material and guidebook.  There is a strong connection between our food choices and our health. Diseases like obesity and type 2 diabetes are very prevalent and are in large-part due to lifestyle choices. The  Pritikin Eating Plan provides plenty of food and hunger-curbing satisfaction. It is easy to follow, affordable, and helps reduce health risks.  Menu Workshop  Clinical staff conducted group or individual video education with verbal and written material  and guidebook.  Patient learns that restaurant meals can sabotage health goals because they are often packed with calories, fat, sodium, and sugar. Recommendations include strategies to plan ahead and to communicate with the manager, chef, or server to help order a healthier meal.  Planning Your Eating Strategy  Clinical staff conducted group or individual video education with verbal and written material and guidebook.  Patient learns about the Pritikin Eating Plan and its benefit of reducing the risk of disease. The Pritikin Eating Plan does not focus on calories. Instead, it emphasizes high-quality, nutrient-rich foods. By knowing the characteristics of the foods, we choose, we can determine their calorie density and make informed decisions.  Targeting Your Nutrition Priorities  Clinical staff conducted group or individual video education with verbal and written material and guidebook.  Patient learns that lifestyle habits have a tremendous impact on disease risk and progression. This video provides eating and physical activity recommendations based on your personal health goals, such as reducing LDL cholesterol, losing weight, preventing or controlling type 2 diabetes, and reducing high blood pressure.  Vitamins and Minerals  Clinical staff conducted group or individual video education with verbal and written material and guidebook.  Patient learns different ways to obtain key vitamins and minerals, including through a recommended healthy diet. It is important to discuss all supplements you take with your doctor.   Healthy Mind-Set    Smoking Cessation  Clinical staff conducted group or individual video education with verbal and written  material and guidebook.  Patient learns that cigarette smoking and tobacco addiction pose a serious health risk which affects millions of people. Stopping smoking will significantly reduce the risk of heart disease, lung disease, and many forms of cancer. Recommended strategies for quitting are covered, including working with your doctor to develop a successful plan.  Culinary   Becoming a Set designer conducted group or individual video education with verbal and written material and guidebook.  Patient learns that cooking at home can be healthy, cost-effective, quick, and puts them in control. Keys to cooking healthy recipes will include looking at your recipe, assessing your equipment needs, planning ahead, making it simple, choosing cost-effective seasonal ingredients, and limiting the use of added fats, salts, and sugars.  Cooking - Breakfast and Snacks  Clinical staff conducted group or individual video education with verbal and written material and guidebook.  Patient learns how important breakfast is to satiety and nutrition through the entire day. Recommendations include key foods to eat during breakfast to help stabilize blood sugar levels and to prevent overeating at meals later in the day. Planning ahead is also a key component.  Cooking - Educational psychologist conducted group or individual video education with verbal and written material and guidebook.  Patient learns eating strategies to improve overall health, including an approach to cook more at home. Recommendations include thinking of animal protein as a side on your plate rather than center stage and focusing instead on lower calorie dense options like vegetables, fruits, whole grains, and plant-based proteins, such as beans. Making sauces in large quantities to freeze for later and leaving the skin on your vegetables are also recommended to maximize your experience.  Cooking - Healthy Salads and  Dressing Clinical staff conducted group or individual video education with verbal and written material and guidebook.  Patient learns that vegetables, fruits, whole grains, and legumes are the foundations of the Pritikin Eating Plan. Recommendations include how to incorporate each of these  in flavorful and healthy salads, and how to create homemade salad dressings. Proper handling of ingredients is also covered. Cooking - Soups and [employer] - Soups and Desserts Clinical staff conducted group or individual video education with verbal and written material and guidebook.  Patient learns that Pritikin soups and desserts make for easy, nutritious, and delicious snacks and meal components that are low in sodium, fat, sugar, and calorie density, while high in vitamins, minerals, and filling fiber. Recommendations include simple and healthy ideas for soups and desserts.   Overview     The Pritikin Solution Program Overview Clinical staff conducted group or individual video education with verbal and written material and guidebook.  Patient learns that the results of the Pritikin Program have been documented in more than 100 articles published in peer-reviewed journals, and the benefits include reducing risk factors for (and, in some cases, even reversing) high cholesterol, high blood pressure, type 2 diabetes, obesity, and more! An overview of the three key pillars of the Pritikin Program will be covered: eating well, doing regular exercise, and having a healthy mind-set.  WORKSHOPS  Exercise: Exercise Basics: Building Your Action Plan Clinical staff led group instruction and group discussion with PowerPoint presentation and patient guidebook. To enhance the learning environment the use of posters, models and videos may be added. At the conclusion of this workshop, patients will comprehend the difference between physical activity and exercise, as well as the benefits of incorporating both, into  their routine. Patients will understand the FITT (Frequency, Intensity, Time, and Type) principle and how to use it to build an exercise action plan. In addition, safety concerns and other considerations for exercise and cardiac rehab will be addressed by the presenter. The purpose of this lesson is to promote a comprehensive and effective weekly exercise routine in order to improve patients' overall level of fitness.   Managing Heart Disease: Your Path to a Healthier Heart Clinical staff led group instruction and group discussion with PowerPoint presentation and patient guidebook. To enhance the learning environment the use of posters, models and videos may be added.At the conclusion of this workshop, patients will understand the anatomy and physiology of the heart. Additionally, they will understand how Pritikin's three pillars impact the risk factors, the progression, and the management of heart disease.  The purpose of this lesson is to provide a high-level overview of the heart, heart disease, and how the Pritikin lifestyle positively impacts risk factors.  Exercise Biomechanics Clinical staff led group instruction and group discussion with PowerPoint presentation and patient guidebook. To enhance the learning environment the use of posters, models and videos may be added. Patients will learn how the structural parts of their bodies function and how these functions impact their daily activities, movement, and exercise. Patients will learn how to promote a neutral spine, learn how to manage pain, and identify ways to improve their physical movement in order to promote healthy living. The purpose of this lesson is to expose patients to common physical limitations that impact physical activity. Participants will learn practical ways to adapt and manage aches and pains, and to minimize their effect on regular exercise. Patients will learn how to maintain good posture while sitting, walking, and  lifting.  Balance Training and Fall Prevention  Clinical staff led group instruction and group discussion with PowerPoint presentation and patient guidebook. To enhance the learning environment the use of posters, models and videos may be added. At the conclusion of this workshop, patients will understand the  importance of their sensorimotor skills (vision, proprioception, and the vestibular system) in maintaining their ability to balance as they age. Patients will apply a variety of balancing exercises that are appropriate for their current level of function. Patients will understand the common causes for poor balance, possible solutions to these problems, and ways to modify their physical environment in order to minimize their fall risk. The purpose of this lesson is to teach patients about the importance of maintaining balance as they age and ways to minimize their risk of falling.  WORKSHOPS   Nutrition:  Fueling a Ship broker led group instruction and group discussion with PowerPoint presentation and patient guidebook. To enhance the learning environment the use of posters, models and videos may be added. Patients will review the foundational principles of the Pritikin Eating Plan and understand what constitutes a serving size in each of the food groups. Patients will also learn Pritikin-friendly foods that are better choices when away from home and review make-ahead meal and snack options. Calorie density will be reviewed and applied to three nutrition priorities: weight maintenance, weight loss, and weight gain. The purpose of this lesson is to reinforce (in a group setting) the key concepts around what patients are recommended to eat and how to apply these guidelines when away from home by planning and selecting Pritikin-friendly options. Patients will understand how calorie density may be adjusted for different weight management goals.  Mindful Eating  Clinical staff led  group instruction and group discussion with PowerPoint presentation and patient guidebook. To enhance the learning environment the use of posters, models and videos may be added. Patients will briefly review the concepts of the Pritikin Eating Plan and the importance of low-calorie dense foods. The concept of mindful eating will be introduced as well as the importance of paying attention to internal hunger signals. Triggers for non-hunger eating and techniques for dealing with triggers will be explored. The purpose of this lesson is to provide patients with the opportunity to review the basic principles of the Pritikin Eating Plan, discuss the value of eating mindfully and how to measure internal cues of hunger and fullness using the Hunger Scale. Patients will also discuss reasons for non-hunger eating and learn strategies to use for controlling emotional eating.  Targeting Your Nutrition Priorities Clinical staff led group instruction and group discussion with PowerPoint presentation and patient guidebook. To enhance the learning environment the use of posters, models and videos may be added. Patients will learn how to determine their genetic susceptibility to disease by reviewing their family history. Patients will gain insight into the importance of diet as part of an overall healthy lifestyle in mitigating the impact of genetics and other environmental insults. The purpose of this lesson is to provide patients with the opportunity to assess their personal nutrition priorities by looking at their family history, their own health history and current risk factors. Patients will also be able to discuss ways of prioritizing and modifying the Pritikin Eating Plan for their highest risk areas  Menu  Clinical staff led group instruction and group discussion with PowerPoint presentation and patient guidebook. To enhance the learning environment the use of posters, models and videos may be added. Using menus  brought in from [employer], or printed from [employer], patients will apply the Pritikin dining out guidelines that were presented in the [employer] video. Patients will also be able to practice these guidelines in a variety of provided scenarios. The purpose of this  lesson is to provide patients with the opportunity to practice hands-on learning of the Pritikin Dining Out guidelines with actual menus and practice scenarios.  Label Reading Clinical staff led group instruction and group discussion with PowerPoint presentation and patient guidebook. To enhance the learning environment the use of posters, models and videos may be added. Patients will review and discuss the Pritikin label reading guidelines presented in Pritikin's Label Reading Educational series video. Using fool labels brought in from local grocery stores and markets, patients will apply the label reading guidelines and determine if the packaged food meet the Pritikin guidelines. The purpose of this lesson is to provide patients with the opportunity to review, discuss, and practice hands-on learning of the Pritikin Label Reading guidelines with actual packaged food labels. Cooking School  Pritikin's [employer] are designed to teach patients ways to prepare quick, simple, and affordable recipes at home. The importance of nutrition's role in chronic disease risk reduction is reflected in its emphasis in the overall Pritikin program. By learning how to prepare essential core Pritikin Eating Plan recipes, patients will increase control over what they eat; be able to customize the flavor of foods without the use of added salt, sugar, or fat; and improve the quality of the food they consume. By learning a set of core recipes which are easily assembled, quickly prepared, and affordable, patients are more likely to prepare more healthy foods at home. These workshops focus on convenient breakfasts, simple  entres, side dishes, and desserts which can be prepared with minimal effort and are consistent with nutrition recommendations for cardiovascular risk reduction. Cooking [employer] are taught by a Armed forces logistics/support/administrative officer (RD) who has been trained by the [employer]. The chef or RD has a clear understanding of the importance of minimizing - if not completely eliminating - added fat, sugar, and sodium in recipes. Throughout the series of Cooking School Workshop sessions, patients will learn about healthy ingredients and efficient methods of cooking to build confidence in their capability to prepare    Cooking School weekly topics:  Adding Flavor- Sodium-Free  Fast and Healthy Breakfasts  Powerhouse Plant-Based Proteins  Satisfying Salads and Dressings  Simple Sides and Sauces  International Cuisine-Spotlight on the [employer] Zones  Delicious Desserts  Savory Soups  [employer] - Meals in a Astronomer Appetizers and Snacks  Comforting Weekend Breakfasts  One-Pot Wonders   Fast Evening Meals  Landscape architect Your Pritikin Plate  WORKSHOPS   Healthy Mindset (Psychosocial):  Focused Goals, Sustainable Changes Clinical staff led group instruction and group discussion with PowerPoint presentation and patient guidebook. To enhance the learning environment the use of posters, models and videos may be added. Patients will be able to apply effective goal setting strategies to establish at least one personal goal, and then take consistent, meaningful action toward that goal. They will learn to identify common barriers to achieving personal goals and develop strategies to overcome them. Patients will also gain an understanding of how our mind-set can impact our ability to achieve goals and the importance of cultivating a positive and growth-oriented mind-set. The purpose of this lesson is to provide patients with a deeper understanding of how to set and  achieve personal goals, as well as the tools and strategies needed to overcome common obstacles which may arise along the way.  From Head to Heart: The Power of a Healthy Outlook  Clinical staff led group instruction and group discussion with PowerPoint presentation  and patient guidebook. To enhance the learning environment the use of posters, models and videos may be added. Patients will be able to recognize and describe the impact of emotions and mood on physical health. They will discover the importance of self-care and explore self-care practices which may work for them. Patients will also learn how to utilize the 4 C's to cultivate a healthier outlook and better manage stress and challenges. The purpose of this lesson is to demonstrate to patients how a healthy outlook is an essential part of maintaining good health, especially as they continue their cardiac rehab journey.  Healthy Sleep for a Healthy Heart Clinical staff led group instruction and group discussion with PowerPoint presentation and patient guidebook. To enhance the learning environment the use of posters, models and videos may be added. At the conclusion of this workshop, patients will be able to demonstrate knowledge of the importance of sleep to overall health, well-being, and quality of life. They will understand the symptoms of, and treatments for, common sleep disorders. Patients will also be able to identify daytime and nighttime behaviors which impact sleep, and they will be able to apply these tools to help manage sleep-related challenges. The purpose of this lesson is to provide patients with a general overview of sleep and outline the importance of quality sleep. Patients will learn about a few of the most common sleep disorders. Patients will also be introduced to the concept of "sleep hygiene," and discover ways to self-manage certain sleeping problems through simple daily behavior changes. Finally, the workshop will motivate  patients by clarifying the links between quality sleep and their goals of heart-healthy living.   Recognizing and Reducing Stress Clinical staff led group instruction and group discussion with PowerPoint presentation and patient guidebook. To enhance the learning environment the use of posters, models and videos may be added. At the conclusion of this workshop, patients will be able to understand the types of stress reactions, differentiate between acute and chronic stress, and recognize the impact that chronic stress has on their health. They will also be able to apply different coping mechanisms, such as reframing negative self-talk. Patients will have the opportunity to practice a variety of stress management techniques, such as deep abdominal breathing, progressive muscle relaxation, and/or guided imagery.  The purpose of this lesson is to educate patients on the role of stress in their lives and to provide healthy techniques for coping with it.  Learning Barriers/Preferences:  Learning Barriers/Preferences - 10/11/23 1114       Learning Barriers/Preferences   Learning Barriers None    Learning Preferences Written Material;Pictoral;Group Instruction;Computer/Internet;Individual Instruction;Video;Skilled Demonstration             Education Topics:  Knowledge Questionnaire Score:  Knowledge Questionnaire Score - 10/11/23 1319       Knowledge Questionnaire Score   Pre Score 22/24             Core Components/Risk Factors/Patient Goals at Admission:  Personal Goals and Risk Factors at Admission - 10/11/23 1114       Core Components/Risk Factors/Patient Goals on Admission    Weight Management Yes;Obesity;Weight Loss    Intervention Weight Management/Obesity: Establish reasonable short term and long term weight goals.;Obesity: Provide education and appropriate resources to help participant work on and attain dietary goals.    Admit Weight 200 lb 13.4 oz (91.1 kg)    Expected  Outcomes Short Term: Continue to assess and modify interventions until short term weight is achieved;Long Term: Adherence to nutrition  and physical activity/exercise program aimed toward attainment of established weight goal;Weight Loss: Understanding of general recommendations for a balanced deficit meal plan, which promotes 1-2 lb weight loss per week and includes a negative energy balance of [phone] kcal/d    Diabetes Yes    Intervention Provide education about signs/symptoms and action to take for hypo/hyperglycemia.;Provide education about proper nutrition, including hydration, and aerobic/resistive exercise prescription along with prescribed medications to achieve blood glucose in normal ranges: Fasting glucose 65-99 mg/dL    Expected Outcomes Short Term: Participant verbalizes understanding of the signs/symptoms and immediate care of hyper/hypoglycemia, proper foot care and importance of medication, aerobic/resistive exercise and nutrition plan for blood glucose control.;Long Term: Attainment of HbA1C < 7%.    Hypertension Yes    Intervention Provide education on lifestyle modifcations including regular physical activity/exercise, weight management, moderate sodium restriction and increased consumption of fresh fruit, vegetables, and low fat dairy, alcohol moderation, and smoking cessation.;Monitor prescription use compliance.    Expected Outcomes Short Term: Continued assessment and intervention until BP is < 140/6mm HG in hypertensive participants. < 130/57mm HG in hypertensive participants with diabetes, heart failure or chronic kidney disease.;Long Term: Maintenance of blood pressure at goal levels.    Lipids Yes    Intervention Provide education and support for participant on nutrition & aerobic/resistive exercise along with prescribed medications to achieve LDL 70mg , HDL >40mg .    Expected Outcomes Short Term: Participant states understanding of desired cholesterol values and is compliant  with medications prescribed. Participant is following exercise prescription and nutrition guidelines.;Long Term: Cholesterol controlled with medications as prescribed, with individualized exercise RX and with personalized nutrition plan. Value goals: LDL < 70mg , HDL > 40 mg.    Personal Goal Other Yes    Personal Goal Walk more often each day. Develop a daily exercise routine. Learn better cooking habits. Make better food choices.    Intervention Provide individualized exercise plan that Karicia can follow at home. She will attend cooking school to learn heart healthy meals that she can make at home.    Expected Outcomes Finnley will comply with individualized exercise plan including walking at home. She will follow individualized nutrition action plan.             Core Components/Risk Factors/Patient Goals Review:    Core Components/Risk Factors/Patient Goals at Discharge (Final Review):    ITP Comments:  ITP Comments     Row Name 10/11/23 1019           ITP Comments Medical Director- Dr. Armanda Magic, MD. Introduction to the [facility] / Intensive Cardiac Rehab. Reviewed intital orientation folder with participant.                Comments: Brendon attended orientation for the cardiac rehabilitation program on  10/11/2023  to perform initial intake and exercise walk test. She was introduced to the [employer] education and orientation packet was reviewed. Completed 6-minute walk test, measurements, initial ITP, and exercise prescription. Vital signs stable. Telemetry-normal sinus rhythm, asymptomatic.   Service time was from 1019 to 1210. Artist Pais, MS, ACSM CEP 10/11/2023 1334

## 2023-10-11 NOTE — Progress Notes (Signed)
Cardiac Rehab Medication Review   Does the patient  feel that his/her medications are working for him/her?  yes  Has the patient been experiencing any side effects to the medications prescribed?  no  Does the patient measure his/her own blood pressure or blood glucose at home?  yes   Does the patient have any problems obtaining medications due to transportation or finances?   no  Understanding of regimen: excellent Understanding of indications: excellent Potential of compliance: excellent    Comments: Kerry Compton is taking her medications as prescribed but hasn't received Repatha yet. She has never had to take oxycodone,which was prescribed after her open heart surgery. She checks her blood pressure every morning. She has an insulin pump and continuous glucose monitoring.    Kerry Compton 10/11/2023 10:52 AM

## 2023-10-17 ENCOUNTER — Telehealth: Payer: Self-pay | Admitting: Pharmacy Technician

## 2023-10-17 ENCOUNTER — Other Ambulatory Visit (HOSPITAL_COMMUNITY): Payer: Self-pay

## 2023-10-17 ENCOUNTER — Encounter (HOSPITAL_COMMUNITY)
Admission: RE | Admit: 2023-10-17 | Discharge: 2023-10-17 | Disposition: A | Discharge status: Home / Self Care | Payer: Medicare Other | Source: Ambulatory Visit | Attending: Cardiology | Admitting: Cardiology

## 2023-10-17 DIAGNOSIS — I10 Essential (primary) hypertension: Secondary | ICD-10-CM | POA: Diagnosis not present

## 2023-10-17 DIAGNOSIS — Z951 Presence of aortocoronary bypass graft: Secondary | ICD-10-CM | POA: Diagnosis not present

## 2023-10-17 DIAGNOSIS — E109 Type 1 diabetes mellitus without complications: Secondary | ICD-10-CM | POA: Diagnosis not present

## 2023-10-17 DIAGNOSIS — M5134 Other intervertebral disc degeneration, thoracic region: Secondary | ICD-10-CM | POA: Diagnosis not present

## 2023-10-17 DIAGNOSIS — E162 Hypoglycemia, unspecified: Secondary | ICD-10-CM | POA: Insufficient documentation

## 2023-10-17 DIAGNOSIS — I2510 Atherosclerotic heart disease of native coronary artery without angina pectoris: Secondary | ICD-10-CM | POA: Diagnosis not present

## 2023-10-17 DIAGNOSIS — I214 Non-ST elevation (NSTEMI) myocardial infarction: Secondary | ICD-10-CM | POA: Diagnosis not present

## 2023-10-17 DIAGNOSIS — Z23 Encounter for immunization: Secondary | ICD-10-CM | POA: Diagnosis not present

## 2023-10-17 DIAGNOSIS — E785 Hyperlipidemia, unspecified: Secondary | ICD-10-CM | POA: Diagnosis not present

## 2023-10-17 DIAGNOSIS — N3281 Overactive bladder: Secondary | ICD-10-CM | POA: Diagnosis not present

## 2023-10-17 DIAGNOSIS — J4520 Mild intermittent asthma, uncomplicated: Secondary | ICD-10-CM | POA: Diagnosis not present

## 2023-10-17 LAB — GLUCOSE, CAPILLARY
Glucose-Capillary: 65 mg/dL — ABNORMAL LOW (ref 70–99)
Glucose-Capillary: 74 mg/dL (ref 70–99)
Glucose-Capillary: 90 mg/dL (ref 70–99)

## 2023-10-17 NOTE — Progress Notes (Signed)
Pre exercise CBG 151. Post exercise CBG 74. Patient asymptomatic. Ate breakfast and and whole grain rice vegetables and rice stir fry for lunch. Suspended insulin pump. Patient was given 2 8 ounce apple juices. Asymptomatic. Recheck CBG 65. Patient was given a glucose gel. Will notify onsite provider Carlos Levering DNP. Patient said she gave herself sliding scale insulin with her meal. Will notify patient's endocrinologist Dr Daune Perch office about today's events. Rebekha left cardiac rehab without complaints or symptoms.Thayer Headings RN BSN

## 2023-10-17 NOTE — Telephone Encounter (Signed)
Pharmacy Patient Advocate Encounter  Received notification from [facility] that Prior Authorization for repatha has been APPROVED from 10/17/23 to 04/15/24   PA #/Case ID/Reference #: [id number]

## 2023-10-17 NOTE — Progress Notes (Signed)
Daily Session Note  Patient Details  Name: Kerry Compton MRN: [id number] Date of Birth: Oct 10, 1956 Referring Provider:   Flowsheet Row INTENSIVE CARDIAC REHAB ORIENT from 10/11/2023 in [facility]  Referring Provider O'Neal, Ronnald Ramp, MD       Encounter Date: 10/17/2023  Check In:  Session Check In - 10/17/23 1522       Check-In   Supervising physician immediately available to respond to emergencies [facility] MD immediately available    Physician(s) Carlos Levering, DNP    Staff Present Cristy Hilts, MS, ACSM-CEP, Exercise Physiologist;Benard Minturn, RN, Marton Redwood, MS, ACSM-CEP, CCRP, Exercise Physiologist;Johnny Hale Bogus, MS, Exercise Physiologist;Jetta Walker BS, ACSM-CEP, Exercise Physiologist    Virtual Visit No    Medication changes reported     No    Fall or balance concerns reported    No    Tobacco Cessation No Change    Warm-up and Cool-down Performed as group-led instruction    Resistance Training Performed No    VAD Patient? No    PAD/SET Patient? No      Pain Assessment   Currently in Pain? No/denies    Pain Score 0-No pain    Multiple Pain Sites No             Capillary Blood Glucose: Results for orders placed or performed during the hospital encounter of 10/17/23 (from the past 24 hour(s))  Glucose, capillary     Status: None   Collection Time: 10/17/23  3:51 PM  Result Value Ref Range   Glucose-Capillary 74 70 - 99 mg/dL  Glucose, capillary     Status: Abnormal   Collection Time: 10/17/23  4:07 PM  Result Value Ref Range   Glucose-Capillary 65 (L) 70 - 99 mg/dL  Glucose, capillary     Status: None   Collection Time: 10/17/23  4:29 PM  Result Value Ref Range   Glucose-Capillary 90 70 - 99 mg/dL      Social History   Tobacco Use  Smoking Status Never  Smokeless Tobacco Never    Goals Met:  Exercise tolerated well No report of concerns or symptoms  today Strength training completed today  Goals Unmet:  Hypoglycemic event resolved   Comments: Pt started cardiac rehab today.  Pt tolerated light exercise without difficulty. VSS, telemetry-***, asymptomatic.  Medication list reconciled. Pt denies barriers to medicaiton compliance.  PSYCHOSOCIAL ASSESSMENT:  PHQ-***. Pt exhibits positive coping skills, hopeful outlook with supportive family. No psychosocial needs identified at this time, no psychosocial interventions necessary.    Pt enjoys ***.   Pt oriented to exercise equipment and routine.    Understanding verbalized.    {[facility] REHAB MEDICAL DIRECTORS:[phone]}

## 2023-10-17 NOTE — Telephone Encounter (Signed)
Pharmacy Patient Advocate Encounter   Received notification from CoverMyMeds that prior authorization for repatha is required/requested.   Insurance verification completed.   The patient is insured through [facility] .   Per test claim: PA required; PA submitted to above mentioned insurance via CoverMyMeds Key/confirmation #/EOC [id number] Status is pending

## 2023-10-18 LAB — GLUCOSE, CAPILLARY: Glucose-Capillary: 151 mg/dL — ABNORMAL HIGH (ref 70–99)

## 2023-10-19 ENCOUNTER — Encounter (HOSPITAL_COMMUNITY)
Admission: RE | Admit: 2023-10-19 | Discharge: 2023-10-19 | Disposition: A | Discharge status: Home / Self Care | Payer: Medicare Other | Source: Ambulatory Visit | Attending: Cardiovascular Disease | Admitting: Cardiovascular Disease

## 2023-10-19 DIAGNOSIS — Z951 Presence of aortocoronary bypass graft: Secondary | ICD-10-CM | POA: Diagnosis not present

## 2023-10-19 DIAGNOSIS — I214 Non-ST elevation (NSTEMI) myocardial infarction: Secondary | ICD-10-CM

## 2023-10-19 DIAGNOSIS — E162 Hypoglycemia, unspecified: Secondary | ICD-10-CM | POA: Diagnosis not present

## 2023-10-19 LAB — GLUCOSE, CAPILLARY
Glucose-Capillary: 162 mg/dL — ABNORMAL HIGH (ref 70–99)
Glucose-Capillary: 230 mg/dL — ABNORMAL HIGH (ref 70–99)

## 2023-10-21 ENCOUNTER — Encounter (HOSPITAL_COMMUNITY)
Admission: RE | Admit: 2023-10-21 | Discharge: 2023-10-21 | Disposition: A | Discharge status: Home / Self Care | Payer: Medicare Other | Source: Ambulatory Visit | Attending: Cardiovascular Disease | Admitting: Cardiovascular Disease

## 2023-10-21 DIAGNOSIS — I214 Non-ST elevation (NSTEMI) myocardial infarction: Secondary | ICD-10-CM

## 2023-10-21 DIAGNOSIS — Z951 Presence of aortocoronary bypass graft: Secondary | ICD-10-CM

## 2023-10-21 DIAGNOSIS — E162 Hypoglycemia, unspecified: Secondary | ICD-10-CM | POA: Diagnosis not present

## 2023-10-24 ENCOUNTER — Encounter (HOSPITAL_COMMUNITY)
Admission: RE | Admit: 2023-10-24 | Discharge: 2023-10-24 | Disposition: A | Discharge status: Home / Self Care | Payer: Medicare Other | Source: Ambulatory Visit | Attending: Cardiovascular Disease | Admitting: Cardiovascular Disease

## 2023-10-24 DIAGNOSIS — E162 Hypoglycemia, unspecified: Secondary | ICD-10-CM | POA: Diagnosis not present

## 2023-10-24 DIAGNOSIS — Z951 Presence of aortocoronary bypass graft: Secondary | ICD-10-CM | POA: Diagnosis not present

## 2023-10-24 DIAGNOSIS — I214 Non-ST elevation (NSTEMI) myocardial infarction: Secondary | ICD-10-CM

## 2023-10-26 ENCOUNTER — Encounter (HOSPITAL_COMMUNITY)
Admission: RE | Admit: 2023-10-26 | Discharge: 2023-10-26 | Disposition: A | Discharge status: Home / Self Care | Payer: Medicare Other | Source: Ambulatory Visit | Attending: Cardiovascular Disease | Admitting: Cardiovascular Disease

## 2023-10-26 DIAGNOSIS — Z951 Presence of aortocoronary bypass graft: Secondary | ICD-10-CM

## 2023-10-26 DIAGNOSIS — I214 Non-ST elevation (NSTEMI) myocardial infarction: Secondary | ICD-10-CM

## 2023-10-26 DIAGNOSIS — E162 Hypoglycemia, unspecified: Secondary | ICD-10-CM | POA: Diagnosis not present

## 2023-10-28 ENCOUNTER — Encounter (HOSPITAL_COMMUNITY)
Admission: RE | Admit: 2023-10-28 | Discharge: 2023-10-28 | Disposition: A | Discharge status: Home / Self Care | Payer: Medicare Other | Source: Ambulatory Visit | Attending: Cardiovascular Disease

## 2023-10-28 DIAGNOSIS — E162 Hypoglycemia, unspecified: Secondary | ICD-10-CM | POA: Diagnosis not present

## 2023-10-28 DIAGNOSIS — I214 Non-ST elevation (NSTEMI) myocardial infarction: Secondary | ICD-10-CM | POA: Diagnosis not present

## 2023-10-28 DIAGNOSIS — Z951 Presence of aortocoronary bypass graft: Secondary | ICD-10-CM | POA: Diagnosis not present

## 2023-10-28 LAB — GLUCOSE, CAPILLARY: Glucose-Capillary: 104 mg/dL — ABNORMAL HIGH (ref 70–99)

## 2023-10-31 ENCOUNTER — Encounter (HOSPITAL_COMMUNITY)
Admission: RE | Admit: 2023-10-31 | Discharge: 2023-10-31 | Disposition: A | Discharge status: Home / Self Care | Payer: Medicare Other | Source: Ambulatory Visit | Attending: Cardiovascular Disease | Admitting: Cardiovascular Disease

## 2023-10-31 DIAGNOSIS — I214 Non-ST elevation (NSTEMI) myocardial infarction: Secondary | ICD-10-CM

## 2023-10-31 DIAGNOSIS — Z951 Presence of aortocoronary bypass graft: Secondary | ICD-10-CM

## 2023-10-31 DIAGNOSIS — E162 Hypoglycemia, unspecified: Secondary | ICD-10-CM | POA: Diagnosis not present

## 2023-10-31 LAB — GLUCOSE, CAPILLARY: Glucose-Capillary: 238 mg/dL — ABNORMAL HIGH (ref 70–99)

## 2023-11-01 NOTE — Progress Notes (Signed)
Cardiac Individual Treatment Plan  Patient Details  Name: SOLAY STADTMILLER MRN: [id number] Date of Birth: 14-Feb-1956 Referring Provider:   Flowsheet Row INTENSIVE CARDIAC REHAB ORIENT from 10/11/2023 in [facility]  Referring Provider O'Neal, Ronnald Ramp, MD       Initial Encounter Date:  Flowsheet Row INTENSIVE CARDIAC REHAB ORIENT from 10/11/2023 in [facility] Heart, Vascular, & Lung Health  Date 10/11/23       Visit Diagnosis: 09/06/23 NSTEMI  09/09/23 CABG x 2  Patient's Home Medications on Admission:  Current Outpatient Medications:    acetaminophen (TYLENOL) 500 MG tablet, Take 1-2 tablets (500-1,000 mg total) by mouth every 6 (six) hours as needed., Disp: , Rfl:    amLODipine (NORVASC) 2.5 MG tablet, Take 2.5 mg by mouth daily., Disp: , Rfl:    aspirin EC 81 MG tablet, Take 1 tablet (81 mg total) by mouth daily., Disp: , Rfl:    atorvastatin (LIPITOR) 80 MG tablet, Take 80 mg by mouth daily.  , Disp: , Rfl:    beclomethasone (QVAR REDIHALER) 40 MCG/ACT inhaler, Inhale 1 puff into the lungs daily., Disp: , Rfl:    Cholecalciferol (VITAMIN D3) 50 MCG (2000 UT) capsule, Take 2,000 Units by mouth daily., Disp: , Rfl:    clopidogrel (PLAVIX) 75 MG tablet, Take 1 tablet (75 mg total) by mouth daily., Disp: 90 tablet, Rfl: 1   Continuous Glucose Sensor (GUARDIAN 4 GLUCOSE SENSOR) MISC, Place 1 Application onto the skin See admin instructions. Every 6 days, Disp: , Rfl:    Evolocumab (REPATHA SURECLICK) 140 MG/ML SOAJ, Inject 140 mg into the skin every 14 (fourteen) days. (Patient not taking: Reported on 10/11/2023), Disp: 6 mL, Rfl: 1   fexofenadine (ALLEGRA) 180 MG tablet, Take 180 mg by mouth daily., Disp: , Rfl:    fluticasone (FLONASE ALLERGY RELIEF) 50 MCG/ACT nasal spray, Place 1 spray into both nostrils daily., Disp: , Rfl:    HUMALOG 100 UNIT/ML injection, Inject 77 Units into the  skin once., Disp: , Rfl:    Insulin Human (INSULIN PUMP) 100 unit/ml SOLN, Inject into the skin continuous. Humalog insulin pump basal rate 1200 am to 300 am = 1.0 600 am to 600 pm = 0.90, Disp: , Rfl:    Insulin Infusion Pump Supplies (MINIMED MIO ADVANCE INFUSE SET) MISC, 1 Application by Intra-arterial Infusion route See admin instructions. Every three days, Disp: , Rfl:    metoprolol tartrate (LOPRESSOR) 25 MG tablet, Take 1 tablet (25 mg total) by mouth 2 (two) times daily., Disp: 180 tablet, Rfl: 1   Multiple Vitamin (MULTIVITAMIN WITH MINERALS) TABS tablet, Take 1 tablet by mouth daily., Disp: , Rfl:    omeprazole (PRILOSEC) 20 MG capsule, 1 capsule 30 minutes before morning meal, Disp: , Rfl:    oxybutynin (DITROPAN-XL) 10 MG 24 hr tablet, Take 10 mg by mouth daily., Disp: , Rfl:    oxyCODONE (OXY IR/ROXICODONE) 5 MG immediate release tablet, Take 1 tablet (5 mg total) by mouth every 4 (four) hours as needed for severe pain. 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, no current liver problems  Hyperlipemia    Hypertension     Tobacco Use: Social History   Tobacco Use  Smoking Status Never  Smokeless Tobacco Never    Labs: Review Flowsheet  More data exists      Latest Ref Rng & Units 02/09/2015 08/27/2015 09/07/2023 09/08/2023 09/09/2023  Labs for ITP Cardiac and Pulmonary Rehab  Cholestrol 0 - 200 mg/dL - - 308  - -  LDL (calc) 0 - 99 mg/dL - - 77  - -  HDL-C >65 mg/dL - - 50  - -  Trlycerides <150 mg/dL - - 50  - -  Hemoglobin A1c 4.8 - 5.6 % 7.6  - 7.0  - -  PH, Arterial 7.35 - 7.45 - - - 7.444  7.304  7.319  7.327  7.451   PCO2 arterial 32 - 48 mmHg - - - 37.1  43.0  44.8  44.8  34.6   Bicarbonate 20.0 - 28.0 mmol/L -  18.9  - 25.5  21.4  23.2  23.6  23.9  24.1   TCO2 22 - 32 mmol/L - 20  - 27  23  25  25  24  26  25  25  23  28    Acid-base deficit 0.0 - 2.0 mmol/L - 8.0  - - 5.0  3.0  3.0   O2 Saturation % - 43.0  - 94  96  94  94  80  100     Details       Multiple values from one day are sorted in reverse-chronological order         Capillary Blood Glucose: Lab Results  Component Value Date   GLUCAP 238 (H) 10/31/2023   GLUCAP 104 (H) 10/28/2023   GLUCAP 230 (H) 10/19/2023   GLUCAP 162 (H) 10/19/2023   GLUCAP 90 10/17/2023     Exercise Target Goals: Exercise Program Goal: Individual exercise prescription set using results from initial 6 min walk test and THRR while considering  patient's activity barriers and safety.   Exercise Prescription Goal: Initial exercise prescription builds to 30-45 minutes a day of aerobic activity, 2-3 days per week.  Home exercise guidelines will be given to patient during program as part of exercise prescription that the participant will acknowledge.  Activity Barriers & Risk Stratification:  Activity Barriers & Cardiac Risk Stratification - 10/11/23 1038       Activity Barriers & Cardiac Risk Stratification   Activity Barriers None    Cardiac Risk Stratification High             6 Minute Walk:  6 Minute Walk     Row Name 10/11/23 1155         6 Minute Walk   Phase Initial     Distance 1374 feet     Walk Time 6 minutes     # of Rest Breaks 0     MPH 2.6     METS 2.83     RPE 9     Perceived Dyspnea  0     VO2 Peak 9.9     Symptoms No     Resting HR 75 bpm     Resting BP 110/62     Resting Oxygen Saturation  96 %     Exercise Oxygen Saturation  during 6 min walk 98 %     Max Ex. HR 94 bpm     Max Ex. BP 138/62     2 Minute Post BP 128/70  Oxygen Initial Assessment:   Oxygen Re-Evaluation:   Oxygen Discharge (Final Oxygen Re-Evaluation):   Initial Exercise Prescription:  Initial Exercise Prescription -  10/11/23 1300       Date of Initial Exercise RX and Referring Provider   Date 10/11/23    Referring Provider O'Neal, Ronnald Ramp, MD    Expected Discharge Date 01/04/24      Treadmill   MPH 2.2    Grade 0    Minutes 15    METs 2.68      NuStep   Level 1    SPM 85    Minutes 15    METs 2.5      Prescription Details   Frequency (times per week) 3    Duration Progress to 30 minutes of continuous aerobic without signs/symptoms of physical distress      Intensity   THRR 40-80% of Max Heartrate 62-123    Ratings of Perceived Exertion 11-13    Perceived Dyspnea 0-4      Progression   Progression Continue to progress workloads to maintain intensity without signs/symptoms of physical distress.      Resistance Training   Training Prescription Yes    Weight 2 lbs    Reps 10-15             Perform Capillary Blood Glucose checks as needed.  Exercise Prescription Changes:   Exercise Prescription Changes     Row Name 10/17/23 1651             Response to Exercise   Blood Pressure (Admit) 112/72       Blood Pressure (Exercise) 122/62       Blood Pressure (Exit) 122/62       Heart Rate (Admit) 89 bpm       Heart Rate (Exercise) 108 bpm       Heart Rate (Exit) 95 bpm       Rating of Perceived Exertion (Exercise) 11       Perceived Dyspnea (Exercise) 0       Symptoms none       Comments Pt first day in the [facility] ICR program       Duration Progress to 30 minutes of  aerobic without signs/symptoms of physical distress       Intensity THRR unchanged         Progression   Progression Continue to progress workloads to maintain intensity without signs/symptoms of physical distress.       Average METs 2.37         Resistance Training   Training Prescription Yes       Weight 2 lbs       Reps 10-15       Time 10 Minutes         Treadmill   MPH 2.2  2.0 speed, no incline       Grade 0       Minutes 15       METs 2.68  2.53 METs at 2/0         NuStep    Level 1       SPM 85       Minutes 15       METs 2.5                Exercise Comments:   Exercise Comments     Row Name 10/17/23 1655           Exercise Comments Pt first day in  the [facility] ICR program. Pt tolerated exercise well with an average MET level of 2.37. Pt is learning her THRR, RPE and ExRx. Will continue to monitor pt and progress workloads as tolerated without sign or symptom                Exercise Goals and Review:   Exercise Goals     Row Name 10/11/23 1038             Exercise Goals   Increase Physical Activity Yes       Intervention Provide advice, education, support and counseling about physical activity/exercise needs.;Develop an individualized exercise prescription for aerobic and resistive training based on initial evaluation findings, risk stratification, comorbidities and participant's personal goals.       Expected Outcomes Short Term: Attend rehab on a regular basis to increase amount of physical activity.;Long Term: Add in home exercise to make exercise part of routine and to increase amount of physical activity.;Long Term: Exercising regularly at least 3-5 days a week.       Increase Strength and Stamina Yes       Intervention Provide advice, education, support and counseling about physical activity/exercise needs.;Develop an individualized exercise prescription for aerobic and resistive training based on initial evaluation findings, risk stratification, comorbidities and participant's personal goals.       Expected Outcomes Short Term: Increase workloads from initial exercise prescription for resistance, speed, and METs.;Short Term: Perform resistance training exercises routinely during rehab and add in resistance training at home;Long Term: Improve cardiorespiratory fitness, muscular endurance and strength as measured by increased METs and functional capacity ( )       Able to understand and use rate of perceived exertion (RPE) scale Yes        Intervention Provide education and explanation on how to use RPE scale       Expected Outcomes Short Term: Able to use RPE daily in rehab to express subjective intensity level;Long Term:  Able to use RPE to guide intensity level when exercising independently       Knowledge and understanding of Target Heart Rate Range (THRR) Yes       Intervention Provide education and explanation of THRR including how the numbers were predicted and where they are located for reference       Expected Outcomes Short Term: Able to state/look up THRR;Long Term: Able to use THRR to govern intensity when exercising independently;Short Term: Able to use daily as guideline for intensity in rehab       Able to check pulse independently Yes       Intervention Provide education and demonstration on how to check pulse in carotid and radial arteries.;Review the importance of being able to check your own pulse for safety during independent exercise       Expected Outcomes Short Term: Able to explain why pulse checking is important during independent exercise;Long Term: Able to check pulse independently and accurately       Understanding of Exercise Prescription Yes       Intervention Provide education, explanation, and written materials on patient's individual exercise prescription       Expected Outcomes Short Term: Able to explain program exercise prescription;Long Term: Able to explain home exercise prescription to exercise independently                Exercise Goals Re-Evaluation :  Exercise Goals Re-Evaluation     Row Name 10/17/23 1654  Exercise Goal Re-Evaluation   Exercise Goals Review Increase Physical Activity;Understanding of Exercise Prescription;Increase Strength and Stamina;Knowledge and understanding of Target Heart Rate Range (THRR);Able to understand and use rate of perceived exertion (RPE) scale       Comments Pt first day in the [facility] ICR program. Pt tolerated exercise well with  an average MET level of 2.37. Pt is learning her THRR, RPE and ExRx.       Expected Outcomes Will continue to monitor pt and progress workloads as tolerated without sign or symptom                Discharge Exercise Prescription (Final Exercise Prescription Changes):  Exercise Prescription Changes - 10/17/23 1651       Response to Exercise   Blood Pressure (Admit) 112/72    Blood Pressure (Exercise) 122/62    Blood Pressure (Exit) 122/62    Heart Rate (Admit) 89 bpm    Heart Rate (Exercise) 108 bpm    Heart Rate (Exit) 95 bpm    Rating of Perceived Exertion (Exercise) 11    Perceived Dyspnea (Exercise) 0    Symptoms none    Comments Pt first day in the [facility] ICR program    Duration Progress to 30 minutes of  aerobic without signs/symptoms of physical distress    Intensity THRR unchanged      Progression   Progression Continue to progress workloads to maintain intensity without signs/symptoms of physical distress.    Average METs 2.37      Resistance Training   Training Prescription Yes    Weight 2 lbs    Reps 10-15    Time 10 Minutes      Treadmill   MPH 2.2   2.0 speed, no incline   Grade 0    Minutes 15    METs 2.68   2.53 METs at 2/0     NuStep   Level 1    SPM 85    Minutes 15    METs 2.5             Nutrition:  Target Goals: Understanding of nutrition guidelines, daily intake of sodium 1500mg , cholesterol 200mg , calories 30% from fat and 7% or less from saturated fats, daily to have 5 or more servings of fruits and vegetables.  Biometrics:  Pre Biometrics - 10/11/23 1019       Pre Biometrics   Waist Circumference 40 inches    Hip Circumference 46.5 inches    Waist to Hip Ratio 0.86 %    Triceps Skinfold 35 mm    % Body Fat 45.2 %    Grip Strength 24 kg    Flexibility 15 in    Single Leg Stand 5.18 seconds              Nutrition Therapy Plan and Nutrition Goals:  Nutrition Therapy & Goals - 10/18/23 0951       Nutrition  Therapy   Diet Heart Healthy/Carbohydrate Consistent diet    Drug/Food Interactions Statins/Certain Fruits      Personal Nutrition Goals   Nutrition Goal Patient to identify strategies for reducing cardiovascular risk by attending the [facility] education and nutrition series weekly.    Personal Goal #2 Patient to improve diet quality by using the plate method as a guide for meal planning to include lean protein/plant protein, fruits, vegetables, whole grains, nonfat dairy as part of a well-balanced diet.    Personal Goal #3 Patient to identify strategies for blood sugar control  with goal A1c <7%.    Comments Elbia has medical history of HTN, NSTEMI, DM1, s/pCABG x2, hyperlipidemia. LDL remains above goal; repatha was approved on 10/17/23. Patient will benefit from participation in intensive cardiac rehab for nutrition, exercise, and lifestyle modification.      Intervention Plan   Intervention Prescribe, educate and counsel regarding individualized specific dietary modifications aiming towards targeted core components such as weight, hypertension, lipid management, diabetes, heart failure and other comorbidities.;Nutrition handout(s) given to patient.    Expected Outcomes Short Term Goal: Understand basic principles of dietary content, such as calories, fat, sodium, cholesterol and nutrients.;Long Term Goal: Adherence to prescribed nutrition plan.             Nutrition Assessments:  Nutrition Assessments - 10/18/23 0950       Rate Your Plate Scores   Pre Score 74            MEDIFICTS Score Key: >=70 Need to make dietary changes  40-70 Heart Healthy Diet <= 40 Therapeutic Level Cholesterol Diet   Flowsheet Row INTENSIVE CARDIAC REHAB from 10/17/2023 in [facility], & Lung Health  Picture Your Plate Total Score on Admission 74      Picture Your Plate Scores: <09 Unhealthy dietary pattern with much room for improvement. 41-50 Dietary  pattern unlikely to meet recommendations for good health and room for improvement. 51-60 More healthful dietary pattern, with some room for improvement.  >60 Healthy dietary pattern, although there may be some specific behaviors that could be improved.    Nutrition Goals Re-Evaluation:  Nutrition Goals Re-Evaluation     Row Name 10/18/23 0951             Goals   Current Weight 201 lb 1 oz (91.2 kg)       Comment lipids WNL, LDL 77, lpa 182, A1c 7.0       Expected Outcome Akeema has medical history of HTN, NSTEMI, DM1, s/pCABG x2, hyperlipidemia. LDL remains above goal; repatha was approved on 10/17/23. Patient will benefit from participation in intensive cardiac rehab for nutrition, exercise, and lifestyle modification.                Nutrition Goals Re-Evaluation:  Nutrition Goals Re-Evaluation     Row Name 10/18/23 0951             Goals   Current Weight 201 lb 1 oz (91.2 kg)       Comment lipids WNL, LDL 77, lpa 182, A1c 7.0       Expected Outcome Mileya has medical history of HTN, NSTEMI, DM1, s/pCABG x2, hyperlipidemia. LDL remains above goal; repatha was approved on 10/17/23. Patient will benefit from participation in intensive cardiac rehab for nutrition, exercise, and lifestyle modification.                Nutrition Goals Discharge (Final Nutrition Goals Re-Evaluation):  Nutrition Goals Re-Evaluation - 10/18/23 0951       Goals   Current Weight 201 lb 1 oz (91.2 kg)    Comment lipids WNL, LDL 77, lpa 182, A1c 7.0    Expected Outcome Kallysta has medical history of HTN, NSTEMI, DM1, s/pCABG x2, hyperlipidemia. LDL remains above goal; repatha was approved on 10/17/23. Patient will benefit from participation in intensive cardiac rehab for nutrition, exercise, and lifestyle modification.             Psychosocial: Target Goals: Acknowledge presence or absence of significant depression and/or stress, maximize  coping skills, provide positive support system.  Participant is able to verbalize types and ability to use techniques and skills needed for reducing stress and depression.  Initial Review & Psychosocial Screening:  Initial Psych Review & Screening - 10/11/23 1113       Initial Review   Current issues with None Identified      Family Dynamics   Good Support System? Yes    Comments Jamileth has neighbors, a brother and sister, 2 daughers, 1 raised son, and sister-in-law Music therapist) for support.      Barriers   Psychosocial barriers to participate in program There are no identifiable barriers or psychosocial needs.      Screening Interventions   Interventions Encouraged to exercise;Provide feedback about the scores to participant    Expected Outcomes Short Term goal: Identification and review with participant of any Quality of Life or Depression concerns found by scoring the questionnaire.;Long Term goal: The participant improves quality of Life and PHQ9 Scores as seen by post scores and/or verbalization of changes             Quality of Life Scores:  Quality of Life - 10/11/23 1320       Quality of Life   Select Quality of Life      Quality of Life Scores   Health/Function Pre 27.47 %    Socioeconomic Pre 27.5 %    Psych/Spiritual Pre 28.93 %    Family Pre 25.5 %    GLOBAL Pre 27.55 %            Scores of 19 and below usually indicate a poorer quality of life in these areas.  A difference of  2-3 points is a clinically meaningful difference.  A difference of 2-3 points in the total score of the Quality of Life Index has been associated with significant improvement in overall quality of life, self-image, physical symptoms, and general health in studies assessing change in quality of life.  PHQ-9: Review Flowsheet       10/11/2023 08/05/2016  Depression screen PHQ 2/9  Decreased Interest 0 0  Down, Depressed, Hopeless 0 0  PHQ - 2 Score 0 0  Altered sleeping 0 -  Tired, decreased energy 0 -  Change in appetite 0 -   Feeling bad or failure about yourself  0 -  Trouble concentrating 0 -  Moving slowly or fidgety/restless 0 -  Suicidal thoughts 0 -  PHQ-9 Score 0 -    Details           Interpretation of Total Score  Total Score Depression Severity:  1-4 = Minimal depression, 5-9 = Mild depression, 10-14 = Moderate depression, 15-19 = Moderately severe depression, 20-27 = Severe depression   Psychosocial Evaluation and Intervention:   Psychosocial Re-Evaluation:  Psychosocial Re-Evaluation     Row Name 10/18/23 0827 11/01/23 1616           Psychosocial Re-Evaluation   Current issues with None Identified None Identified      Interventions Encouraged to attend Cardiac Rehabilitation for the exercise Encouraged to attend Cardiac Rehabilitation for the exercise      Continue Psychosocial Services  No Follow up required No Follow up required               Psychosocial Discharge (Final Psychosocial Re-Evaluation):  Psychosocial Re-Evaluation - 11/01/23 1616       Psychosocial Re-Evaluation   Current issues with None Identified    Interventions Encouraged to attend Cardiac Rehabilitation for  the exercise    Continue Psychosocial Services  No Follow up required             Vocational Rehabilitation: Provide vocational rehab assistance to qualifying candidates.   Vocational Rehab Evaluation & Intervention:  Vocational Rehab - 10/11/23 1115       Initial Vocational Rehab Evaluation & Intervention   Assessment shows need for Vocational Rehabilitation No   Retired. No VR needs.            Education: Education Goals: Education classes will be provided on a weekly basis, covering required topics. Participant will state understanding/return demonstration of topics presented.    Education     Row Name 10/19/23 1600     Education   Cardiac Education Topics [facility]   Customer service manager   Weekly Topic Powerhouse Plant-Based  Proteins   Instruction Review Code 1- Verbalizes Understanding   Class Start Time 1400   Class Stop Time 1438   Class Time Calculation (min) 38 min    Row Name 10/21/23 1400     Education   Cardiac Education Topics [facility]in   Hospital doctor Education   General Education Hypertension and Heart Disease   Instruction Review Code 1- Verbalizes Understanding   Class Start Time 1357   Class Stop Time 1430   Class Time Calculation (min) 33 min    Row Name 10/24/23 1700     Education   Cardiac Education Topics [facility]in   Geographical information systems officer Psychosocial   Psychosocial Workshop From Head to Heart: The Power of a Healthy Outlook   Instruction Review Code 1- Verbalizes Understanding   Class Start Time 1400   Class Stop Time 1450   Class Time Calculation (min) 50 min    Row Name 10/26/23 1500     Education   Cardiac Education Topics Pritikin   Customer service manager   Weekly Topic Adding Flavor - Sodium-Free   Instruction Review Code 1- Verbalizes Understanding   Class Start Time 1400   Class Stop Time 1440   Class Time Calculation (min) 40 min    Row Name 10/28/23 1500     Education   Cardiac Education Topics [facility]ritikin   Hospital doctor Education   General Education Heart Disease Risk Reduction   Instruction Review Code 1- Verbalizes Understanding   Class Start Time 1400   Class Stop Time 1433   Class Time Calculation (min) 33 min    Row Name 10/31/23 1600     Education   Cardiac Education Topics [facility]ritikin   Select Workshops     Workshops   Educator Exercise Physiologist   Select Exercise   Exercise Workshop Location manager and Fall Prevention   Instruction Review Code 1- Verbalizes Understanding   Class Start Time 1406    Class Stop Time 1500   Class Time Calculation (min) 54 min            Core Videos: Exercise    Move It!  Clinical staff conducted group or individual video education with verbal and written material and guidebook.  Patient learns the recommended [facility]in exercise program. Exercise with the goal  of living a long, healthy life. Some of the health benefits of exercise include controlled diabetes, healthier blood pressure levels, improved cholesterol levels, improved heart and lung capacity, improved sleep, and better body composition. Everyone should speak with their doctor before starting or changing an exercise routine.  Biomechanical Limitations Clinical staff conducted group or individual video education with verbal and written material and guidebook.  Patient learns how biomechanical limitations can impact exercise and how we can mitigate and possibly overcome limitations to have an impactful and balanced exercise routine.  Body Composition Clinical staff conducted group or individual video education with verbal and written material and guidebook.  Patient learns that body composition (ratio of muscle mass to fat mass) is a key component to assessing overall fitness, rather than body weight alone. Increased fat mass, especially visceral belly fat, can put [place] at increased risk for metabolic syndrome, type 2 diabetes, heart disease, and even death. It is recommended to combine diet and exercise (cardiovascular and resistance training) to improve your body composition. Seek guidance from your physician and exercise physiologist before implementing an exercise routine.  Exercise Action Plan Clinical staff conducted group or individual video education with verbal and written material and guidebook.  Patient learns the recommended strategies to achieve and enjoy long-term exercise adherence, including variety, self-motivation, self-efficacy, and positive decision making. Benefits of exercise  include fitness, good health, weight management, more energy, better sleep, less stress, and overall well-being.  Medical   Heart Disease Risk Reduction Clinical staff conducted group or individual video education with verbal and written material and guidebook.  Patient learns our heart is our most vital organ as it circulates oxygen, nutrients, white blood cells, and hormones throughout the entire body, and carries waste away. Data supports a plant-based eating plan like the Pritikin Program for its effectiveness in slowing progression of and reversing heart disease. The video provides a number of recommendations to address heart disease.   Metabolic Syndrome and Belly Fat  Clinical staff conducted group or individual video education with verbal and written material and guidebook.  Patient learns what metabolic syndrome is, how it leads to heart disease, and how one can reverse it and keep it from coming back. You have metabolic syndrome if you have 3 of the following 5 criteria: abdominal obesity, high blood pressure, high triglycerides, low HDL cholesterol, and high blood sugar.  Hypertension and Heart Disease Clinical staff conducted group or individual video education with verbal and written material and guidebook.  Patient learns that high blood pressure, or hypertension, is very common in the [place]. Hypertension is largely due to excessive salt intake, but other important risk factors include being overweight, physical inactivity, drinking too much alcohol, smoking, and not eating enough potassium from fruits and vegetables. High blood pressure is a leading risk factor for heart attack, stroke, congestive heart failure, dementia, kidney failure, and premature death. Long-term effects of excessive salt intake include stiffening of the arteries and thickening of heart muscle and organ damage. Recommendations include ways to reduce hypertension and the risk of heart disease.  Diseases of  Our Time - Focusing on Diabetes Clinical staff conducted group or individual video education with verbal and written material and guidebook.  Patient learns why the best way to stop diseases of our time is prevention, through food and other lifestyle changes. Medicine (such as prescription pills and surgeries) is often only a Band-Aid on the problem, not a long-term solution. Most common diseases of our time include obesity, type  2 diabetes, hypertension, heart disease, and cancer. The Pritikin Program is recommended and has been proven to help reduce, reverse, and/or prevent the damaging effects of metabolic syndrome.  Nutrition   Overview of the Pritikin Eating Plan  Clinical staff conducted group or individual video education with verbal and written material and guidebook.  Patient learns about the Pritikin Eating Plan for disease risk reduction. The Pritikin Eating Plan emphasizes a wide variety of unrefined, minimally-processed carbohydrates, like fruits, vegetables, whole grains, and legumes. Go, Caution, and Stop food choices are explained. Plant-based and lean animal proteins are emphasized. Rationale provided for low sodium intake for blood pressure control, low added sugars for blood sugar stabilization, and low added fats and oils for coronary artery disease risk reduction and weight management.  Calorie Density  Clinical staff conducted group or individual video education with verbal and written material and guidebook.  Patient learns about calorie density and how it impacts the Pritikin Eating Plan. Knowing the characteristics of the food you choose will help you decide whether those foods will lead to weight gain or weight loss, and whether you want to consume more or less of them. Weight loss is usually a side effect of the Pritikin Eating Plan because of its focus on low calorie-dense foods.  Label Reading  Clinical staff conducted group or individual video education with verbal and  written material and guidebook.  Patient learns about the Pritikin recommended label reading guidelines and corresponding recommendations regarding calorie density, added sugars, sodium content, and whole grains.  Dining Out - Part 1  Clinical staff conducted group or individual video education with verbal and written material and guidebook.  Patient learns that restaurant meals can be sabotaging because they can be so high in calories, fat, sodium, and/or sugar. Patient learns recommended strategies on how to positively address this and avoid unhealthy pitfalls.  Facts on Fats  Clinical staff conducted group or individual video education with verbal and written material and guidebook.  Patient learns that lifestyle modifications can be just as effective, if not more so, as many medications for lowering your risk of heart disease. A Pritikin lifestyle can help to reduce your risk of inflammation and atherosclerosis (cholesterol build-up, or plaque, in the artery walls). Lifestyle interventions such as dietary choices and physical activity address the cause of atherosclerosis. A review of the types of fats and their impact on blood cholesterol levels, along with dietary recommendations to reduce fat intake is also included.  Nutrition Action Plan  Clinical staff conducted group or individual video education with verbal and written material and guidebook.  Patient learns how to incorporate Pritikin recommendations into their lifestyle. Recommendations include planning and keeping personal health goals in mind as an important part of their success.  Healthy Mind-Set    Healthy Minds, Bodies, Hearts  Clinical staff conducted group or individual video education with verbal and written material and guidebook.  Patient learns how to identify when they are stressed. Video will discuss the impact of that stress, as well as the many benefits of stress management. Patient will also be introduced to stress  management techniques. The way we think, act, and feel has an impact on our hearts.  How Our Thoughts Can Heal Our Hearts  Clinical staff conducted group or individual video education with verbal and written material and guidebook.  Patient learns that negative thoughts can cause depression and anxiety. This can result in negative lifestyle behavior and serious health problems. Cognitive behavioral therapy is an effective  method to help control our thoughts in order to change and improve our emotional outlook.  Additional Videos:  Exercise    Improving Performance  Clinical staff conducted group or individual video education with verbal and written material and guidebook.  Patient learns to use a non-linear approach by alternating intensity levels and lengths of time spent exercising to help burn more calories and lose more body fat. Cardiovascular exercise helps improve heart health, metabolism, hormonal balance, blood sugar control, and recovery from fatigue. Resistance training improves strength, endurance, balance, coordination, reaction time, metabolism, and muscle mass. Flexibility exercise improves circulation, posture, and balance. Seek guidance from your physician and exercise physiologist before implementing an exercise routine and learn your capabilities and proper form for all exercise.  Introduction to Yoga  Clinical staff conducted group or individual video education with verbal and written material and guidebook.  Patient learns about yoga, a discipline of the coming together of mind, breath, and body. The benefits of yoga include improved flexibility, improved range of motion, better posture and core strength, increased lung function, weight loss, and positive self-image. Yoga's heart health benefits include lowered blood pressure, healthier heart rate, decreased cholesterol and triglyceride levels, improved immune function, and reduced stress. Seek guidance from your physician and  exercise physiologist before implementing an exercise routine and learn your capabilities and proper form for all exercise.  Medical   Aging: Enhancing Your Quality of Life  Clinical staff conducted group or individual video education with verbal and written material and guidebook.  Patient learns key strategies and recommendations to stay in good physical health and enhance quality of life, such as prevention strategies, having an advocate, securing a Health Care Proxy and Power of Attorney, and keeping a list of medications and system for tracking them. It also discusses how to avoid risk for bone loss.  Biology of Weight Control  Clinical staff conducted group or individual video education with verbal and written material and guidebook.  Patient learns that weight gain occurs because we consume more calories than we burn (eating more, moving less). Even if your body weight is normal, you may have higher ratios of fat compared to muscle mass. Too much body fat puts you at increased risk for cardiovascular disease, heart attack, stroke, type 2 diabetes, and obesity-related cancers. In addition to exercise, following the Pritikin Eating Plan can help reduce your risk.  Decoding Lab Results  Clinical staff conducted group or individual video education with verbal and written material and guidebook.  Patient learns that lab test reflects one measurement whose values change over time and are influenced by many factors, including medication, stress, sleep, exercise, food, hydration, pre-existing medical conditions, and more. It is recommended to use the knowledge from this video to become more involved with your lab results and evaluate your numbers to speak with your doctor.   Diseases of Our Time - Overview  Clinical staff conducted group or individual video education with verbal and written material and guidebook.  Patient learns that according to the CDC, 50% to 70% of chronic diseases (such as  obesity, type 2 diabetes, elevated lipids, hypertension, and heart disease) are avoidable through lifestyle improvements including healthier food choices, listening to satiety cues, and increased physical activity.  Sleep Disorders Clinical staff conducted group or individual video education with verbal and written material and guidebook.  Patient learns how good quality and duration of sleep are important to overall health and well-being. Patient also learns about sleep disorders and how they impact  health along with recommendations to address them, including discussing with a physician.  Nutrition  Dining Out - Part 2 Clinical staff conducted group or individual video education with verbal and written material and guidebook.  Patient learns how to plan ahead and communicate in order to maximize their dining experience in a healthy and nutritious manner. Included are recommended food choices based on the type of restaurant the patient is visiting.   Fueling a Banker conducted group or individual video education with verbal and written material and guidebook.  There is a strong connection between our food choices and our health. Diseases like obesity and type 2 diabetes are very prevalent and are in large-part due to lifestyle choices. The Pritikin Eating Plan provides plenty of food and hunger-curbing satisfaction. It is easy to follow, affordable, and helps reduce health risks.  Menu Workshop  Clinical staff conducted group or individual video education with verbal and written material and guidebook.  Patient learns that restaurant meals can sabotage health goals because they are often packed with calories, fat, sodium, and sugar. Recommendations include strategies to plan ahead and to communicate with the manager, chef, or server to help order a healthier meal.  Planning Your Eating Strategy  Clinical staff conducted group or individual video education with verbal and  written material and guidebook.  Patient learns about the Pritikin Eating Plan and its benefit of reducing the risk of disease. The Pritikin Eating Plan does not focus on calories. Instead, it emphasizes high-quality, nutrient-rich foods. By knowing the characteristics of the foods, we choose, we can determine their calorie density and make informed decisions.  Targeting Your Nutrition Priorities  Clinical staff conducted group or individual video education with verbal and written material and guidebook.  Patient learns that lifestyle habits have a tremendous impact on disease risk and progression. This video provides eating and physical activity recommendations based on your personal health goals, such as reducing LDL cholesterol, losing weight, preventing or controlling type 2 diabetes, and reducing high blood pressure.  Vitamins and Minerals  Clinical staff conducted group or individual video education with verbal and written material and guidebook.  Patient learns different ways to obtain key vitamins and minerals, including through a recommended healthy diet. It is important to discuss all supplements you take with your doctor.   Healthy Mind-Set    Smoking Cessation  Clinical staff conducted group or individual video education with verbal and written material and guidebook.  Patient learns that cigarette smoking and tobacco addiction pose a serious health risk which affects millions of people. Stopping smoking will significantly reduce the risk of heart disease, lung disease, and many forms of cancer. Recommended strategies for quitting are covered, including working with your doctor to develop a successful plan.  Culinary   Becoming a Set designer conducted group or individual video education with verbal and written material and guidebook.  Patient learns that cooking at home can be healthy, cost-effective, quick, and puts them in control. Keys to cooking healthy recipes  will include looking at your recipe, assessing your equipment needs, planning ahead, making it simple, choosing cost-effective seasonal ingredients, and limiting the use of added fats, salts, and sugars.  Cooking - Breakfast and Snacks  Clinical staff conducted group or individual video education with verbal and written material and guidebook.  Patient learns how important breakfast is to satiety and nutrition through the entire day. Recommendations include key foods to eat during breakfast to help stabilize blood  sugar levels and to prevent overeating at meals later in the day. Planning ahead is also a key component.  Cooking - Educational psychologist conducted group or individual video education with verbal and written material and guidebook.  Patient learns eating strategies to improve overall health, including an approach to cook more at home. Recommendations include thinking of animal protein as a side on your plate rather than center stage and focusing instead on lower calorie dense options like vegetables, fruits, whole grains, and plant-based proteins, such as beans. Making sauces in large quantities to freeze for later and leaving the skin on your vegetables are also recommended to maximize your experience.  Cooking - Healthy Salads and Dressing Clinical staff conducted group or individual video education with verbal and written material and guidebook.  Patient learns that vegetables, fruits, whole grains, and legumes are the foundations of the Pritikin Eating Plan. Recommendations include how to incorporate each of these in flavorful and healthy salads, and how to create homemade salad dressings. Proper handling of ingredients is also covered. Cooking - Soups and [employer] - Soups and Desserts Clinical staff conducted group or individual video education with verbal and written material and guidebook.  Patient learns that Pritikin soups and desserts make for easy, nutritious,  and delicious snacks and meal components that are low in sodium, fat, sugar, and calorie density, while high in vitamins, minerals, and filling fiber. Recommendations include simple and healthy ideas for soups and desserts.   Overview     The Pritikin Solution Program Overview Clinical staff conducted group or individual video education with verbal and written material and guidebook.  Patient learns that the results of the Pritikin Program have been documented in more than 100 articles published in peer-reviewed journals, and the benefits include reducing risk factors for (and, in some cases, even reversing) high cholesterol, high blood pressure, type 2 diabetes, obesity, and more! An overview of the three key pillars of the Pritikin Program will be covered: eating well, doing regular exercise, and having a healthy mind-set.  WORKSHOPS  Exercise: Exercise Basics: Building Your Action Plan Clinical staff led group instruction and group discussion with PowerPoint presentation and patient guidebook. To enhance the learning environment the use of posters, models and videos may be added. At the conclusion of this workshop, patients will comprehend the difference between physical activity and exercise, as well as the benefits of incorporating both, into their routine. Patients will understand the FITT (Frequency, Intensity, Time, and Type) principle and how to use it to build an exercise action plan. In addition, safety concerns and other considerations for exercise and cardiac rehab will be addressed by the presenter. The purpose of this lesson is to promote a comprehensive and effective weekly exercise routine in order to improve patients' overall level of fitness.   Managing Heart Disease: Your Path to a Healthier Heart Clinical staff led group instruction and group discussion with PowerPoint presentation and patient guidebook. To enhance the learning environment the use of posters, models and  videos may be added.At the conclusion of this workshop, patients will understand the anatomy and physiology of the heart. Additionally, they will understand how Pritikin's three pillars impact the risk factors, the progression, and the management of heart disease.  The purpose of this lesson is to provide a high-level overview of the heart, heart disease, and how the Pritikin lifestyle positively impacts risk factors.  Exercise Biomechanics Clinical staff led group instruction and group discussion with PowerPoint presentation  and patient guidebook. To enhance the learning environment the use of posters, models and videos may be added. Patients will learn how the structural parts of their bodies function and how these functions impact their daily activities, movement, and exercise. Patients will learn how to promote a neutral spine, learn how to manage pain, and identify ways to improve their physical movement in order to promote healthy living. The purpose of this lesson is to expose patients to common physical limitations that impact physical activity. Participants will learn practical ways to adapt and manage aches and pains, and to minimize their effect on regular exercise. Patients will learn how to maintain good posture while sitting, walking, and lifting.  Balance Training and Fall Prevention  Clinical staff led group instruction and group discussion with PowerPoint presentation and patient guidebook. To enhance the learning environment the use of posters, models and videos may be added. At the conclusion of this workshop, patients will understand the importance of their sensorimotor skills (vision, proprioception, and the vestibular system) in maintaining their ability to balance as they age. Patients will apply a variety of balancing exercises that are appropriate for their current level of function. Patients will understand the common causes for poor balance, possible solutions to these  problems, and ways to modify their physical environment in order to minimize their fall risk. The purpose of this lesson is to teach patients about the importance of maintaining balance as they age and ways to minimize their risk of falling.  WORKSHOPS   Nutrition:  Fueling a Ship broker led group instruction and group discussion with PowerPoint presentation and patient guidebook. To enhance the learning environment the use of posters, models and videos may be added. Patients will review the foundational principles of the Pritikin Eating Plan and understand what constitutes a serving size in each of the food groups. Patients will also learn Pritikin-friendly foods that are better choices when away from home and review make-ahead meal and snack options. Calorie density will be reviewed and applied to three nutrition priorities: weight maintenance, weight loss, and weight gain. The purpose of this lesson is to reinforce (in a group setting) the key concepts around what patients are recommended to eat and how to apply these guidelines when away from home by planning and selecting Pritikin-friendly options. Patients will understand how calorie density may be adjusted for different weight management goals.  Mindful Eating  Clinical staff led group instruction and group discussion with PowerPoint presentation and patient guidebook. To enhance the learning environment the use of posters, models and videos may be added. Patients will briefly review the concepts of the Pritikin Eating Plan and the importance of low-calorie dense foods. The concept of mindful eating will be introduced as well as the importance of paying attention to internal hunger signals. Triggers for non-hunger eating and techniques for dealing with triggers will be explored. The purpose of this lesson is to provide patients with the opportunity to review the basic principles of the Pritikin Eating Plan, discuss the value of  eating mindfully and how to measure internal cues of hunger and fullness using the Hunger Scale. Patients will also discuss reasons for non-hunger eating and learn strategies to use for controlling emotional eating.  Targeting Your Nutrition Priorities Clinical staff led group instruction and group discussion with PowerPoint presentation and patient guidebook. To enhance the learning environment the use of posters, models and videos may be added. Patients will learn how to determine their genetic susceptibility to disease by  reviewing their family history. Patients will gain insight into the importance of diet as part of an overall healthy lifestyle in mitigating the impact of genetics and other environmental insults. The purpose of this lesson is to provide patients with the opportunity to assess their personal nutrition priorities by looking at their family history, their own health history and current risk factors. Patients will also be able to discuss ways of prioritizing and modifying the Pritikin Eating Plan for their highest risk areas  Menu  Clinical staff led group instruction and group discussion with PowerPoint presentation and patient guidebook. To enhance the learning environment the use of posters, models and videos may be added. Using menus brought in from [employer], or printed from [employer], patients will apply the Pritikin dining out guidelines that were presented in the [employer] video. Patients will also be able to practice these guidelines in a variety of provided scenarios. The purpose of this lesson is to provide patients with the opportunity to practice hands-on learning of the Pritikin Dining Out guidelines with actual menus and practice scenarios.  Label Reading Clinical staff led group instruction and group discussion with PowerPoint presentation and patient guidebook. To enhance the learning environment the use of posters, models and videos may be  added. Patients will review and discuss the Pritikin label reading guidelines presented in Pritikin's Label Reading Educational series video. Using fool labels brought in from local grocery stores and markets, patients will apply the label reading guidelines and determine if the packaged food meet the Pritikin guidelines. The purpose of this lesson is to provide patients with the opportunity to review, discuss, and practice hands-on learning of the Pritikin Label Reading guidelines with actual packaged food labels. Cooking School  [facility] are designed to teach patients ways to prepare quick, simple, and affordable recipes at home. The importance of nutrition's role in chronic disease risk reduction is reflected in its emphasis in the overall [facility] program. By learning how to prepare essential core [facility] Eating Plan recipes, patients will increase control over what they eat; be able to customize the flavor of foods without the use of added salt, sugar, or fat; and improve the quality of the food they consume. By learning a set of core recipes which are easily assembled, quickly prepared, and affordable, patients are more likely to prepare more healthy foods at home. These workshops focus on convenient breakfasts, simple entres, side dishes, and desserts which can be prepared with minimal effort and are consistent with nutrition recommendations for cardiovascular risk reduction. Cooking [employer] are taught by a Armed forces logistics/support/administrative officer (RD) who has been trained by the [employer]. The chef or RD has a clear understanding of the importance of minimizing - if not completely eliminating - added fat, sugar, and sodium in recipes. Throughout the series of Cooking School Workshop sessions, patients will learn about healthy ingredients and efficient methods of cooking to build confidence in their capability to prepare    Cooking School weekly topics:  Adding  Flavor- Sodium-Free  Fast and Healthy Breakfasts  Powerhouse Plant-Based Proteins  Satisfying Salads and Dressings  Simple Sides and Sauces  International Cuisine-Spotlight on the [employer] Zones  Delicious Desserts  Savory Soups  [employer] - Meals in a Astronomer Appetizers and Snacks  Comforting Weekend Breakfasts  One-Pot Wonders   Fast Evening Meals  Landscape architect Your Pritikin Plate  WORKSHOPS   Healthy Mindset (Psychosocial):  Focused  Goals, Sustainable Changes Clinical staff led group instruction and group discussion with PowerPoint presentation and patient guidebook. To enhance the learning environment the use of posters, models and videos may be added. Patients will be able to apply effective goal setting strategies to establish at least one personal goal, and then take consistent, meaningful action toward that goal. They will learn to identify common barriers to achieving personal goals and develop strategies to overcome them. Patients will also gain an understanding of how our mind-set can impact our ability to achieve goals and the importance of cultivating a positive and growth-oriented mind-set. The purpose of this lesson is to provide patients with a deeper understanding of how to set and achieve personal goals, as well as the tools and strategies needed to overcome common obstacles which may arise along the way.  From Head to Heart: The Power of a Healthy Outlook  Clinical staff led group instruction and group discussion with PowerPoint presentation and patient guidebook. To enhance the learning environment the use of posters, models and videos may be added. Patients will be able to recognize and describe the impact of emotions and mood on physical health. They will discover the importance of self-care and explore self-care practices which may work for them. Patients will also learn how to utilize the 4 C's to cultivate a healthier outlook and better  manage stress and challenges. The purpose of this lesson is to demonstrate to patients how a healthy outlook is an essential part of maintaining good health, especially as they continue their cardiac rehab journey.  Healthy Sleep for a Healthy Heart Clinical staff led group instruction and group discussion with PowerPoint presentation and patient guidebook. To enhance the learning environment the use of posters, models and videos may be added. At the conclusion of this workshop, patients will be able to demonstrate knowledge of the importance of sleep to overall health, well-being, and quality of life. They will understand the symptoms of, and treatments for, common sleep disorders. Patients will also be able to identify daytime and nighttime behaviors which impact sleep, and they will be able to apply these tools to help manage sleep-related challenges. The purpose of this lesson is to provide patients with a general overview of sleep and outline the importance of quality sleep. Patients will learn about a few of the most common sleep disorders. Patients will also be introduced to the concept of "sleep hygiene," and discover ways to self-manage certain sleeping problems through simple daily behavior changes. Finally, the workshop will motivate patients by clarifying the links between quality sleep and their goals of heart-healthy living.   Recognizing and Reducing Stress Clinical staff led group instruction and group discussion with PowerPoint presentation and patient guidebook. To enhance the learning environment the use of posters, models and videos may be added. At the conclusion of this workshop, patients will be able to understand the types of stress reactions, differentiate between acute and chronic stress, and recognize the impact that chronic stress has on their health. They will also be able to apply different coping mechanisms, such as reframing negative self-talk. Patients will have the opportunity  to practice a variety of stress management techniques, such as deep abdominal breathing, progressive muscle relaxation, and/or guided imagery.  The purpose of this lesson is to educate patients on the role of stress in their lives and to provide healthy techniques for coping with it.  Learning Barriers/Preferences:  Learning Barriers/Preferences - 10/11/23 1114       Learning Barriers/Preferences  Learning Barriers None    Learning Preferences Written Material;Pictoral;Group Instruction;Computer/Internet;Individual Instruction;Video;Skilled Demonstration             Education Topics:  Knowledge Questionnaire Score:  Knowledge Questionnaire Score - 10/11/23 1319       Knowledge Questionnaire Score   Pre Score 22/24             Core Components/Risk Factors/Patient Goals at Admission:  Personal Goals and Risk Factors at Admission - 10/11/23 1114       Core Components/Risk Factors/Patient Goals on Admission    Weight Management Yes;Obesity;Weight Loss    Intervention Weight Management/Obesity: Establish reasonable short term and long term weight goals.;Obesity: Provide education and appropriate resources to help participant work on and attain dietary goals.    Admit Weight 200 lb 13.4 oz (91.1 kg)    Expected Outcomes Short Term: Continue to assess and modify interventions until short term weight is achieved;Long Term: Adherence to nutrition and physical activity/exercise program aimed toward attainment of established weight goal;Weight Loss: Understanding of general recommendations for a balanced deficit meal plan, which promotes 1-2 lb weight loss per week and includes a negative energy balance of [phone] kcal/d    Diabetes Yes    Intervention Provide education about signs/symptoms and action to take for hypo/hyperglycemia.;Provide education about proper nutrition, including hydration, and aerobic/resistive exercise prescription along with prescribed medications to achieve  blood glucose in normal ranges: Fasting glucose 65-99 mg/dL    Expected Outcomes Short Term: Participant verbalizes understanding of the signs/symptoms and immediate care of hyper/hypoglycemia, proper foot care and importance of medication, aerobic/resistive exercise and nutrition plan for blood glucose control.;Long Term: Attainment of HbA1C < 7%.    Hypertension Yes    Intervention Provide education on lifestyle modifcations including regular physical activity/exercise, weight management, moderate sodium restriction and increased consumption of fresh fruit, vegetables, and low fat dairy, alcohol moderation, and smoking cessation.;Monitor prescription use compliance.    Expected Outcomes Short Term: Continued assessment and intervention until BP is < 140/6mm HG in hypertensive participants. < 130/49mm HG in hypertensive participants with diabetes, heart failure or chronic kidney disease.;Long Term: Maintenance of blood pressure at goal levels.    Lipids Yes    Intervention Provide education and support for participant on nutrition & aerobic/resistive exercise along with prescribed medications to achieve LDL 70mg , HDL >40mg .    Expected Outcomes Short Term: Participant states understanding of desired cholesterol values and is compliant with medications prescribed. Participant is following exercise prescription and nutrition guidelines.;Long Term: Cholesterol controlled with medications as prescribed, with individualized exercise RX and with personalized nutrition plan. Value goals: LDL < 70mg , HDL > 40 mg.    Personal Goal Other Yes    Personal Goal Walk more often each day. Develop a daily exercise routine. Learn better cooking habits. Make better food choices.    Intervention Provide individualized exercise plan that Kayliana can follow at home. She will attend cooking school to learn heart healthy meals that she can make at home.    Expected Outcomes Delvia will comply with individualized exercise  plan including walking at home. She will follow individualized nutrition action plan.             Core Components/Risk Factors/Patient Goals Review:   Goals and Risk Factor Review     Row Name 10/18/23 0828 11/01/23 1617           Core Components/Risk Factors/Patient Goals Review   Personal Goals Review Weight Management/Obesity;Hypertension;Lipids;Diabetes Weight Management/Obesity;Hypertension;Lipids;Diabetes  Review Mayukha started cardiac rehab on 10/17/23. Jadey did well with exercise.Kaidance was hypoglycemic post exercise and was treated with juice and glucose gel. Rebba started cardiac rehab on 10/17/23. Nylie is off to a good start  to  exercise. Vital signs have been stable. CBG's have been variable. Shamone has been adjusting her basal insulin via her insulin pump.      Expected Outcomes Laurabelle will continue to participate in cardiac rehab for exercise, nutrition and lifestyle modifications. CBG's will be within noram parameters Norene will continue to participate in cardiac rehab for exercise, nutrition and lifestyle modifications. CBG's will be within noram parameters               Core Components/Risk Factors/Patient Goals at Discharge (Final Review):   Goals and Risk Factor Review - 11/01/23 1617       Core Components/Risk Factors/Patient Goals Review   Personal Goals Review Weight Management/Obesity;Hypertension;Lipids;Diabetes    Review Ethelean started cardiac rehab on 10/17/23. Bg is off to a good start  to  exercise. Vital signs have been stable. CBG's have been variable. Camala has been adjusting her basal insulin via her insulin pump.    Expected Outcomes Raniyah will continue to participate in cardiac rehab for exercise, nutrition and lifestyle modifications. CBG's will be within noram parameters             ITP Comments:  ITP Comments     Row Name 10/11/23 1019 10/18/23 0823 11/01/23 1616       ITP Comments Medical Director- Dr.  Armanda Magic, MD. Introduction to the [facility] Education Program / Intensive Cardiac Rehab. Reviewed intital orientation folder with participant. 30 Day ITP Review. Elona started cardiac rehab on 10/17/23. Lynzie did well with exercise. Shataria was hypoglycemic post exercise and was treated with juice and glucose gel. 30 Day ITP Review. Sarye started cardiac rehab on 10/17/23. Ashleylynn is off to a good start to exercise at cardiac rehab.              Comments: See ITP comments.Thayer Headings RN BSN

## 2023-11-02 ENCOUNTER — Encounter (HOSPITAL_COMMUNITY)
Admission: RE | Admit: 2023-11-02 | Discharge: 2023-11-02 | Disposition: A | Discharge status: Home / Self Care | Payer: Medicare Other | Source: Ambulatory Visit | Attending: Cardiovascular Disease

## 2023-11-02 DIAGNOSIS — Z951 Presence of aortocoronary bypass graft: Secondary | ICD-10-CM | POA: Diagnosis not present

## 2023-11-02 DIAGNOSIS — I214 Non-ST elevation (NSTEMI) myocardial infarction: Secondary | ICD-10-CM

## 2023-11-02 DIAGNOSIS — E162 Hypoglycemia, unspecified: Secondary | ICD-10-CM | POA: Diagnosis not present

## 2023-11-04 ENCOUNTER — Encounter (HOSPITAL_COMMUNITY): Payer: Medicare Other

## 2023-11-04 ENCOUNTER — Telehealth (HOSPITAL_COMMUNITY): Payer: Self-pay

## 2023-11-04 NOTE — Telephone Encounter (Signed)
Kerry Compton called and can not make it to rehab today due to having an ear ache. I have canceled her appointment!

## 2023-11-07 ENCOUNTER — Encounter (HOSPITAL_COMMUNITY)
Admission: RE | Admit: 2023-11-07 | Discharge: 2023-11-07 | Disposition: A | Discharge status: Home / Self Care | Payer: Medicare Other | Source: Ambulatory Visit | Attending: Cardiovascular Disease | Admitting: Cardiovascular Disease

## 2023-11-07 DIAGNOSIS — E162 Hypoglycemia, unspecified: Secondary | ICD-10-CM | POA: Diagnosis not present

## 2023-11-07 DIAGNOSIS — Z951 Presence of aortocoronary bypass graft: Secondary | ICD-10-CM

## 2023-11-07 DIAGNOSIS — I214 Non-ST elevation (NSTEMI) myocardial infarction: Secondary | ICD-10-CM | POA: Diagnosis not present

## 2023-11-09 ENCOUNTER — Encounter (HOSPITAL_COMMUNITY)
Admission: RE | Admit: 2023-11-09 | Discharge: 2023-11-09 | Disposition: A | Discharge status: Home / Self Care | Payer: Medicare Other | Source: Ambulatory Visit | Attending: Cardiovascular Disease | Admitting: Cardiovascular Disease

## 2023-11-09 DIAGNOSIS — Z951 Presence of aortocoronary bypass graft: Secondary | ICD-10-CM

## 2023-11-09 DIAGNOSIS — I214 Non-ST elevation (NSTEMI) myocardial infarction: Secondary | ICD-10-CM | POA: Diagnosis not present

## 2023-11-09 DIAGNOSIS — E162 Hypoglycemia, unspecified: Secondary | ICD-10-CM | POA: Diagnosis not present

## 2023-11-11 ENCOUNTER — Encounter (HOSPITAL_COMMUNITY): Payer: Medicare Other

## 2023-11-14 ENCOUNTER — Encounter (HOSPITAL_COMMUNITY)
Admission: RE | Admit: 2023-11-14 | Discharge: 2023-11-14 | Disposition: A | Discharge status: Home / Self Care | Payer: Medicare Other | Source: Ambulatory Visit | Attending: Cardiology | Admitting: Cardiology

## 2023-11-14 DIAGNOSIS — I214 Non-ST elevation (NSTEMI) myocardial infarction: Secondary | ICD-10-CM | POA: Insufficient documentation

## 2023-11-14 DIAGNOSIS — Z951 Presence of aortocoronary bypass graft: Secondary | ICD-10-CM | POA: Insufficient documentation

## 2023-11-16 ENCOUNTER — Encounter (HOSPITAL_COMMUNITY)
Admission: RE | Admit: 2023-11-16 | Discharge: 2023-11-16 | Disposition: A | Discharge status: Home / Self Care | Payer: Medicare Other | Source: Ambulatory Visit | Attending: Cardiovascular Disease | Admitting: Cardiovascular Disease

## 2023-11-16 DIAGNOSIS — Z951 Presence of aortocoronary bypass graft: Secondary | ICD-10-CM | POA: Diagnosis not present

## 2023-11-16 DIAGNOSIS — I214 Non-ST elevation (NSTEMI) myocardial infarction: Secondary | ICD-10-CM | POA: Diagnosis not present

## 2023-11-18 ENCOUNTER — Encounter (HOSPITAL_COMMUNITY)
Admission: RE | Admit: 2023-11-18 | Discharge: 2023-11-18 | Disposition: A | Discharge status: Home / Self Care | Payer: Medicare Other | Source: Ambulatory Visit | Attending: Cardiovascular Disease

## 2023-11-18 DIAGNOSIS — Z951 Presence of aortocoronary bypass graft: Secondary | ICD-10-CM

## 2023-11-18 DIAGNOSIS — I214 Non-ST elevation (NSTEMI) myocardial infarction: Secondary | ICD-10-CM

## 2023-11-18 NOTE — Progress Notes (Signed)
Cardiac Individual Treatment Plan  Patient Details  Name: LASHAUNE FREIMARK MRN: [id number] Date of Birth: 1956/09/07 Referring Provider:   Flowsheet Row INTENSIVE CARDIAC REHAB ORIENT from 10/11/2023 in [facility], [facility]  Referring Provider O'Neal, Ronnald Ramp, MD       Initial Encounter Date:  Flowsheet Row INTENSIVE CARDIAC REHAB ORIENT from 10/11/2023 in [facility] for Heart, Vascular, & Lung Health  Date 10/11/23       Visit Diagnosis: 09/06/23 NSTEMI  09/09/23 CABG x 2  Patient's Home Medications on Admission:  Current Outpatient Medications:    acetaminophen (TYLENOL) 500 MG tablet, Take 1-2 tablets (500-1,000 mg total) by mouth every 6 (six) hours as needed., Disp: , Rfl:    amLODipine (NORVASC) 2.5 MG tablet, Take 2.5 mg by mouth daily., Disp: , Rfl:    aspirin EC 81 MG tablet, Take 1 tablet (81 mg total) by mouth daily., Disp: , Rfl:    atorvastatin (LIPITOR) 80 MG tablet, Take 80 mg by mouth daily.  , Disp: , Rfl:    beclomethasone (QVAR REDIHALER) 40 MCG/ACT inhaler, Inhale 1 puff into the lungs daily., Disp: , Rfl:    Cholecalciferol (VITAMIN D3) 50 MCG (2000 UT) capsule, Take 2,000 Units by mouth daily., Disp: , Rfl:    clopidogrel (PLAVIX) 75 MG tablet, Take 1 tablet (75 mg total) by mouth daily., Disp: 90 tablet, Rfl: 1   Continuous Glucose Sensor (GUARDIAN 4 GLUCOSE SENSOR) MISC, Place 1 Application onto the skin See admin instructions. Every 6 days, Disp: , Rfl:    Evolocumab (REPATHA SURECLICK) 140 MG/ML SOAJ, Inject 140 mg into the skin every 14 (fourteen) days. (Patient not taking: Reported on 10/11/2023), Disp: 6 mL, Rfl: 1   fexofenadine (ALLEGRA) 180 MG tablet, Take 180 mg by mouth daily., Disp: , Rfl:    fluticasone (FLONASE ALLERGY RELIEF) 50 MCG/ACT nasal spray, Place 1 spray into both nostrils daily., Disp: , Rfl:    HUMALOG 100 UNIT/ML injection, Inject 77 Units into the  skin once., Disp: , Rfl:    Insulin Human (INSULIN PUMP) 100 unit/ml SOLN, Inject into the skin continuous. Humalog insulin pump basal rate 1200 am to 300 am = 1.0 600 am to 600 pm = 0.90, Disp: , Rfl:    Insulin Infusion Pump Supplies (MINIMED MIO ADVANCE INFUSE SET) MISC, 1 Application by Intra-arterial Infusion route See admin instructions. Every three days, Disp: , Rfl:    metoprolol tartrate (LOPRESSOR) 25 MG tablet, Take 1 tablet (25 mg total) by mouth 2 (two) times daily., Disp: 180 tablet, Rfl: 1   Multiple Vitamin (MULTIVITAMIN WITH MINERALS) TABS tablet, Take 1 tablet by mouth daily., Disp: , Rfl:    omeprazole (PRILOSEC) 20 MG capsule, 1 capsule 30 minutes before morning meal, Disp: , Rfl:    oxybutynin (DITROPAN-XL) 10 MG 24 hr tablet, Take 10 mg by mouth daily., Disp: , Rfl:    oxyCODONE (OXY IR/ROXICODONE) 5 MG immediate release tablet, Take 1 tablet (5 mg total) by mouth every 4 (four) hours as needed for severe pain. 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, no current liver problems  Hyperlipemia    Hypertension     Tobacco Use: Social History   Tobacco Use  Smoking Status Never  Smokeless Tobacco Never    Labs: Review Flowsheet  More data exists      Latest Ref Rng & Units 02/09/2015 08/27/2015 09/07/2023 09/08/2023 09/09/2023  Labs for ITP Cardiac and Pulmonary Rehab  Cholestrol 0 - 200 mg/dL - - 161  - -  LDL (calc) 0 - 99 mg/dL - - 77  - -  HDL-C >09 mg/dL - - 50  - -  Trlycerides <150 mg/dL - - 50  - -  Hemoglobin A1c 4.8 - 5.6 % 7.6  - 7.0  - -  PH, Arterial 7.35 - 7.45 - - - 7.444  7.304  7.319  7.327  7.451   PCO2 arterial 32 - 48 mmHg - - - 37.1  43.0  44.8  44.8  34.6   Bicarbonate 20.0 - 28.0 mmol/L -  18.9  - 25.5  21.4  23.2  23.6  23.9  24.1   TCO2 22 - 32 mmol/L - 20  - 27  23  25  25  24  26  25  25  23  28    Acid-base deficit 0.0 - 2.0 mmol/L - 8.0  - - 5.0  3.0  3.0   O2 Saturation % - 43.0  - 94  96  94  94  80  100     Details       Multiple values from one day are sorted in reverse-chronological order         Capillary Blood Glucose: Lab Results  Component Value Date   GLUCAP 238 (H) 10/31/2023   GLUCAP 104 (H) 10/28/2023   GLUCAP 230 (H) 10/19/2023   GLUCAP 162 (H) 10/19/2023   GLUCAP 90 10/17/2023     Exercise Target Goals: Exercise Program Goal: Individual exercise prescription set using results from initial 6 min walk test and THRR while considering  patient's activity barriers and safety.   Exercise Prescription Goal: Initial exercise prescription builds to 30-45 minutes a day of aerobic activity, 2-3 days per week.  Home exercise guidelines will be given to patient during program as part of exercise prescription that the participant will acknowledge.  Activity Barriers & Risk Stratification:  Activity Barriers & Cardiac Risk Stratification - 10/11/23 1038       Activity Barriers & Cardiac Risk Stratification   Activity Barriers None    Cardiac Risk Stratification High             6 Minute Walk:  6 Minute Walk     Row Name 10/11/23 1155         6 Minute Walk   Phase Initial     Distance 1374 feet     Walk Time 6 minutes     # of Rest Breaks 0     MPH 2.6     METS 2.83     RPE 9     Perceived Dyspnea  0     VO2 Peak 9.9     Symptoms No     Resting HR 75 bpm     Resting BP 110/62     Resting Oxygen Saturation  96 %     Exercise Oxygen Saturation  during 6 min walk 98 %     Max Ex. HR 94 bpm     Max Ex. BP 138/62     2 Minute Post BP 128/70  Oxygen Initial Assessment:   Oxygen Re-Evaluation:   Oxygen Discharge (Final Oxygen Re-Evaluation):   Initial Exercise Prescription:  Initial Exercise Prescription -  11/07/23 1639       Treadmill   MPH --   2.0 speed, no incline   METs --   2.53 METs at 2/0            Perform Capillary Blood Glucose checks as needed.  Exercise Prescription Changes:   Exercise Prescription Changes     Row Name 10/17/23 1651 11/07/23 1640           Response to Exercise   Blood Pressure (Admit) 112/72 98/60      Blood Pressure (Exercise) 122/62 148/72      Blood Pressure (Exit) 122/62 102/60      Heart Rate (Admit) 89 bpm 80 bpm      Heart Rate (Exercise) 108 bpm 109 bpm      Heart Rate (Exit) 95 bpm 89 bpm      Rating of Perceived Exertion (Exercise) 11 11      Perceived Dyspnea (Exercise) 0 0      Symptoms none none      Comments Pt first day in the [facility] I[facility] program Reviewedd MET's, goals and home ExRx      Duration Progress to 30 minutes of  aerobic without signs/symptoms of physical distress Progress to 30 minutes of  aerobic without signs/symptoms of physical distress      Intensity THRR unchanged THRR unchanged        Progression   Progression Continue to progress workloads to maintain intensity without signs/symptoms of physical distress. Continue to progress workloads to maintain intensity without signs/symptoms of physical distress.      Average METs 2.37 2.45        Resistance Training   Training Prescription Yes Yes      Weight 2 lbs 2 lbs      Reps 10-15 10-15      Time 10 Minutes 10 Minutes        Treadmill   MPH 2.2  2.0 speed, no incline 2.2      Grade 0 0      Minutes 15 15      METs 2.68  2.53 METs at 2/0 2.68        NuStep   Level 1 2      SPM 85 106      Minutes 15 15      METs 2.5 2.2        Home Exercise Plan   Plans to continue exercise at -- Home (comment)      Frequency -- Add 4 additional days to program exercise sessions.      Initial Home Exercises Provided -- 11/07/23               Exercise Comments:   Exercise Comments     Row Name 10/17/23 1655 11/07/23 1652         Exercise Comments Pt  first day in the [facility] program. Pt tolerated exercise well with an average MET level of 2.37. Pt is learning her THRR, RPE and ExRx. Will continue to monitor pt and progress workloads as tolerated without sign or symptom Reviewed MET's, goals and home ExRx. Pt tolerated exercise well with an average MET level of 2.45. Pt is feeling good about her goals and is getting into a good exercise routine. She is walking on the days she does not come to cardiac  rehab. She is also adding in some handweights and stretches. She feels good about her exercise plan and is gaining strength and stamina. she is meeting ACSM recommendations for 30 mins 3-4 days               Exercise Goals and Review:   Exercise Goals     Row Name 10/11/23 1038             Exercise Goals   Increase Physical Activity Yes       Intervention Provide advice, education, support and counseling about physical activity/exercise needs.;Develop an individualized exercise prescription for aerobic and resistive training based on initial evaluation findings, risk stratification, comorbidities and participant's personal goals.       Expected Outcomes Short Term: Attend rehab on a regular basis to increase amount of physical activity.;Long Term: Add in home exercise to make exercise part of routine and to increase amount of physical activity.;Long Term: Exercising regularly at least 3-5 days a week.       Increase Strength and Stamina Yes       Intervention Provide advice, education, support and counseling about physical activity/exercise needs.;Develop an individualized exercise prescription for aerobic and resistive training based on initial evaluation findings, risk stratification, comorbidities and participant's personal goals.       Expected Outcomes Short Term: Increase workloads from initial exercise prescription for resistance, speed, and METs.;Short Term: Perform resistance training exercises routinely during rehab and add in  resistance training at home;Long Term: Improve cardiorespiratory fitness, muscular endurance and strength as measured by increased METs and functional capacity ( )       Able to understand and use rate of perceived exertion (RPE) scale Yes       Intervention Provide education and explanation on how to use RPE scale       Expected Outcomes Short Term: Able to use RPE daily in rehab to express subjective intensity level;Long Term:  Able to use RPE to guide intensity level when exercising independently       Knowledge and understanding of Target Heart Rate Range (THRR) Yes       Intervention Provide education and explanation of THRR including how the numbers were predicted and where they are located for reference       Expected Outcomes Short Term: Able to state/look up THRR;Long Term: Able to use THRR to govern intensity when exercising independently;Short Term: Able to use daily as guideline for intensity in rehab       Able to check pulse independently Yes       Intervention Provide education and demonstration on how to check pulse in carotid and radial arteries.;Review the importance of being able to check your own pulse for safety during independent exercise       Expected Outcomes Short Term: Able to explain why pulse checking is important during independent exercise;Long Term: Able to check pulse independently and accurately       Understanding of Exercise Prescription Yes       Intervention Provide education, explanation, and written materials on patient's individual exercise prescription       Expected Outcomes Short Term: Able to explain program exercise prescription;Long Term: Able to explain home exercise prescription to exercise independently                Exercise Goals Re-Evaluation :  Exercise Goals Re-Evaluation     Row Name 10/17/23 1654 11/07/23 1643           Exercise Goal Re-Evaluation  Exercise Goals Review Increase Physical Activity;Understanding of Exercise  Prescription;Increase Strength and Stamina;Knowledge and understanding of Target Heart Rate Range (THRR);Able to understand and use rate of perceived exertion (RPE) scale Increase Physical Activity;Understanding of Exercise Prescription;Increase Strength and Stamina;Knowledge and understanding of Target Heart Rate Range (THRR);Able to understand and use rate of perceived exertion (RPE) scale      Comments Pt first day in the [facility] program. Pt tolerated exercise well with an average MET level of 2.37. Pt is learning her THRR, RPE and ExRx. Reviewed MET's, goals and home ExRx. Pt tolerated exercise well with an average MET level of 2.45. Pt is feeling good about her goals and is getting into a good exercise routine. She is walking on the days she does not come to cardiac rehab. She is also adding in some handweights and stretches. She feels good about her exercise plan and is gaining strength and stamina. she is meeting ACSM recommendations for 30 mins 3-4 days      Expected Outcomes Will continue to monitor pt and progress workloads as tolerated without sign or symptom Will continue to monitor pt and progress workloads as tolerated without sign or symptom               Discharge Exercise Prescription (Final Exercise Prescription Changes):  Exercise Prescription Changes - 11/07/23 1640       Response to Exercise   Blood Pressure (Admit) 98/60    Blood Pressure (Exercise) 148/72    Blood Pressure (Exit) 102/60    Heart Rate (Admit) 80 bpm    Heart Rate (Exercise) 109 bpm    Heart Rate (Exit) 89 bpm    Rating of Perceived Exertion (Exercise) 11    Perceived Dyspnea (Exercise) 0    Symptoms none    Comments Reviewedd MET's, goals and home ExRx    Duration Progress to 30 minutes of  aerobic without signs/symptoms of physical distress    Intensity THRR unchanged      Progression   Progression Continue to progress workloads to maintain intensity without signs/symptoms of physical  distress.    Average METs 2.45      Resistance Training   Training Prescription Yes    Weight 2 lbs    Reps 10-15    Time 10 Minutes      Treadmill   MPH 2.2    Grade 0    Minutes 15    METs 2.68      NuStep   Level 2    SPM 106    Minutes 15    METs 2.2      Home Exercise Plan   Plans to continue exercise at Home (comment)    Frequency Add 4 additional days to program exercise sessions.    Initial Home Exercises Provided 11/07/23             Nutrition:  Target Goals: Understanding of nutrition guidelines, daily intake of sodium 1500mg , cholesterol 200mg , calories 30% from fat and 7% or less from saturated fats, daily to have 5 or more servings of fruits and vegetables.  Biometrics:  Pre Biometrics - 10/11/23 1019       Pre Biometrics   Waist Circumference 40 inches    Hip Circumference 46.5 inches    Waist to Hip Ratio 0.86 %    Triceps Skinfold 35 mm    % Body Fat 45.2 %    Grip Strength 24 kg    Flexibility 15 in    Single  Leg Stand 5.18 seconds              Nutrition Therapy Plan and Nutrition Goals:  Nutrition Therapy & Goals - 10/18/23 0951       Nutrition Therapy   Diet Heart Healthy/Carbohydrate Consistent diet    Drug/Food Interactions Statins/Certain Fruits      Personal Nutrition Goals   Nutrition Goal Patient to identify strategies for reducing cardiovascular risk by attending the [facility] education and nutrition series weekly.    Personal Goal #2 Patient to improve diet quality by using the plate method as a guide for meal planning to include lean protein/plant protein, fruits, vegetables, whole grains, nonfat dairy as part of a well-balanced diet.    Personal Goal #3 Patient to identify strategies for blood sugar control with goal A1c <7%.    Comments Yazmin has medical history of HTN, NSTEMI, DM1, s/pCABG x2, hyperlipidemia. LDL remains above goal; repatha was approved on 10/17/23. Patient will benefit from participation in  intensive cardiac rehab for nutrition, exercise, and lifestyle modification.      Intervention Plan   Intervention Prescribe, educate and counsel regarding individualized specific dietary modifications aiming towards targeted core components such as weight, hypertension, lipid management, diabetes, heart failure and other comorbidities.;Nutrition handout(s) given to patient.    Expected Outcomes Short Term Goal: Understand basic principles of dietary content, such as calories, fat, sodium, cholesterol and nutrients.;Long Term Goal: Adherence to prescribed nutrition plan.             Nutrition Assessments:  Nutrition Assessments - 10/18/23 0950       Rate Your Plate Scores   Pre Score 74            MEDIFICTS Score Key: >=70 Need to make dietary changes  40-70 Heart Healthy Diet <= 40 Therapeutic Level Cholesterol Diet   Flowsheet Row INTENSIVE CARDIAC REHAB from 10/17/2023 in [facility] Lung [facility]  Picture Your Plate Total Score on Admission 74      Picture Your Plate Scores: <78 Unhealthy dietary pattern with much room for improvement. 41-50 Dietary pattern unlikely to meet recommendations for good health and room for improvement. 51-60 More healthful dietary pattern, with some room for improvement.  >60 Healthy dietary pattern, although there may be some specific behaviors that could be improved.    Nutrition Goals Re-Evaluation:  Nutrition Goals Re-Evaluation     Row Name 10/18/23 0951             Goals   Current Weight 201 lb 1 oz (91.2 kg)       Comment lipids WNL, LDL 77, lpa 182, A1c 7.0       Expected Outcome Hadleigh has medical history of HTN, NSTEMI, DM1, s/pCABG x2, hyperlipidemia. LDL remains above goal; repatha was approved on 10/17/23. Patient will benefit from participation in intensive cardiac rehab for nutrition, exercise, and lifestyle modification.                Nutrition Goals  Re-Evaluation:  Nutrition Goals Re-Evaluation     Row Name 10/18/23 0951             Goals   Current Weight 201 lb 1 oz (91.2 kg)       Comment lipids WNL, LDL 77, lpa 182, A1c 7.0       Expected Outcome Jodelle has medical history of HTN, NSTEMI, DM1, s/pCABG x2, hyperlipidemia. LDL remains above goal; repatha was approved on 10/17/23. Patient will  benefit from participation in intensive cardiac rehab for nutrition, exercise, and lifestyle modification.                Nutrition Goals Discharge (Final Nutrition Goals Re-Evaluation):  Nutrition Goals Re-Evaluation - 10/18/23 0951       Goals   Current Weight 201 lb 1 oz (91.2 kg)    Comment lipids WNL, LDL 77, lpa 182, A1c 7.0    Expected Outcome Kailiana has medical history of HTN, NSTEMI, DM1, s/pCABG x2, hyperlipidemia. LDL remains above goal; repatha was approved on 10/17/23. Patient will benefit from participation in intensive cardiac rehab for nutrition, exercise, and lifestyle modification.             Psychosocial: Target Goals: Acknowledge presence or absence of significant depression and/or stress, maximize coping skills, provide positive support system. Participant is able to verbalize types and ability to use techniques and skills needed for reducing stress and depression.  Initial Review & Psychosocial Screening:  Initial Psych Review & Screening - 10/11/23 1113       Initial Review   Current issues with None Identified      Family Dynamics   Good Support System? Yes    Comments Jeraldine has neighbors, a brother and sister, 2 daughers, 1 raised son, and sister-in-law Music therapist) for support.      Barriers   Psychosocial barriers to participate in program There are no identifiable barriers or psychosocial needs.      Screening Interventions   Interventions Encouraged to exercise;Provide feedback about the scores to participant    Expected Outcomes Short Term goal: Identification and review with participant of  any Quality of Life or Depression concerns found by scoring the questionnaire.;Long Term goal: The participant improves quality of Life and PHQ9 Scores as seen by post scores and/or verbalization of changes             Quality of Life Scores:  Quality of Life - 10/11/23 1320       Quality of Life   Select Quality of Life      Quality of Life Scores   Health/Function Pre 27.47 %    Socioeconomic Pre 27.5 %    Psych/Spiritual Pre 28.93 %    Family Pre 25.5 %    GLOBAL Pre 27.55 %            Scores of 19 and below usually indicate a poorer quality of life in these areas.  A difference of  2-3 points is a clinically meaningful difference.  A difference of 2-3 points in the total score of the Quality of Life Index has been associated with significant improvement in overall quality of life, self-image, physical symptoms, and general health in studies assessing change in quality of life.  PHQ-9: Review Flowsheet       10/11/2023 08/05/2016  Depression screen PHQ 2/9  Decreased Interest 0 0  Down, Depressed, Hopeless 0 0  PHQ - 2 Score 0 0  Altered sleeping 0 -  Tired, decreased energy 0 -  Change in appetite 0 -  Feeling bad or failure about yourself  0 -  Trouble concentrating 0 -  Moving slowly or fidgety/restless 0 -  Suicidal thoughts 0 -  PHQ-9 Score 0 -    Details           Interpretation of Total Score  Total Score Depression Severity:  1-4 = Minimal depression, 5-9 = Mild depression, 10-14 = Moderate depression, 15-19 = Moderately severe depression, 20-27 =  Severe depression   Psychosocial Evaluation and Intervention:   Psychosocial Re-Evaluation:  Psychosocial Re-Evaluation     Row Name 10/18/23 0827 11/01/23 1616 11/18/23 0837         Psychosocial Re-Evaluation   Current issues with None Identified None Identified None Identified     Interventions Encouraged to attend Cardiac Rehabilitation for the exercise Encouraged to attend Cardiac  Rehabilitation for the exercise Encouraged to attend Cardiac Rehabilitation for the exercise     Continue Psychosocial Services  No Follow up required No Follow up required No Follow up required              Psychosocial Discharge (Final Psychosocial Re-Evaluation):  Psychosocial Re-Evaluation - 11/18/23 0837       Psychosocial Re-Evaluation   Current issues with None Identified    Interventions Encouraged to attend Cardiac Rehabilitation for the exercise    Continue Psychosocial Services  No Follow up required             Vocational Rehabilitation: Provide vocational rehab assistance to qualifying candidates.   Vocational Rehab Evaluation & Intervention:  Vocational Rehab - 10/11/23 1115       Initial Vocational Rehab Evaluation & Intervention   Assessment shows need for Vocational Rehabilitation No   Retired. No VR needs.            Education: Education Goals: Education classes will be provided on a weekly basis, covering required topics. Participant will state understanding/return demonstration of topics presented.    Education     Row Name 10/19/23 1600     Education   Cardiac Education Topics [facility]   Customer service manager   Weekly Topic Powerhouse Plant-Based Proteins   Instruction Review Code 1- Verbalizes Understanding   Class Start Time 1400   Class Stop Time 1438   Class Time Calculation (min) 38 min    Row Name 10/21/23 1400     Education   Cardiac Education Topics [facility]   Hospital doctor Education   General Education Hypertension and Heart Disease   Instruction Review Code 1- Verbalizes Understanding   Class Start Time 1357   Class Stop Time 1430   Class Time Calculation (min) 33 min    Row Name 10/24/23 1700     Education   Cardiac Education Topics [facility]   Arts administrator Psychosocial   Psychosocial Workshop From Head to Heart: The Power of a Healthy Outlook   Instruction Review Code 1- Verbalizes Understanding   Class Start Time 1400   Class Stop Time 1450   Class Time Calculation (min) 50 min    Row Name 10/26/23 1500     Education   Cardiac Education Topics [facility]   Customer service manager   Weekly Topic Adding Flavor - Sodium-Free   Instruction Review Code 1- Verbalizes Understanding   Class Start Time 1400   Class Stop Time 1440   Class Time Calculation (min) 40 min    Row Name 10/28/23 1500     Education   Cardiac Education Topics [facility]   Hospital doctor Education   General Education Heart Disease Risk Reduction  Instruction Review Code 1- Verbalizes Understanding   Class Start Time 1400   Class Stop Time 1433   Class Time Calculation (min) 33 min    Row Name 10/31/23 1600     Education   Cardiac Education Topics [facility]   Geographical information systems officer Exercise   Exercise Workshop Location manager and Fall Prevention   Instruction Review Code 1- Verbalizes Understanding   Class Start Time 1406   Class Stop Time 1500   Class Time Calculation (min) 54 min    Row Name 11/02/23 1500     Education   Cardiac Education Topics [facility]   Customer service manager   Weekly Topic Fast and Healthy Breakfasts   Instruction Review Code 1- Verbalizes Understanding   Class Start Time 1355   Class Stop Time 1435   Class Time Calculation (min) 40 min    Row Name 11/07/23 1400     Education   Cardiac Education Topics Pritikin   Nurse, children's Exercise Physiologist   Select Psychosocial   Psychosocial Healthy Minds, Bodies, Hearts   Instruction Review Code 1- Verbalizes  Understanding   Class Start Time 1357   Class Stop Time 1429   Class Time Calculation (min) 32 min    Row Name 11/09/23 1400     Education   Cardiac Education Topics Pritikin   Nurse, children's Nurse   Select Nutrition   Nutrition Becoming a Pritikin Chef   Instruction Review Code 1- Verbalizes Understanding   Class Start Time 1356    Row Name 11/14/23 1400     Education   Cardiac Education Topics Pritikin   Select Core Videos     Core Videos   Educator Exercise Physiologist   Select Nutrition   Nutrition Other  Label Reading   Instruction Review Code 1- Verbalizes Understanding   Class Start Time 1356   Class Stop Time 1430   Class Time Calculation (min) 34 min    Row Name 11/16/23 1400     Education   Cardiac Education Topics Pritikin   Customer service manager   Weekly Topic Tasty Appetizers and Snacks   Instruction Review Code 1- Verbalizes Understanding   Class Start Time 1355   Class Stop Time 1440   Class Time Calculation (min) 45 min    Row Name 11/18/23 1400     Education   Cardiac Education Topics Pritikin   Glass blower/designer Nutrition   Nutrition Workshop Label Reading   Instruction Review Code 1- Verbalizes Understanding   Class Start Time 1401   Class Stop Time 1433   Class Time Calculation (min) 32 min    Row Name 11/21/23 1400     Education   Cardiac Education Topics Pritikin   Geographical information systems officer Psychosocial   Psychosocial Workshop Recognizing and Reducing Stress   Instruction Review Code 1- Verbalizes Understanding            Core Videos: Exercise    Move It!  Clinical staff conducted group or individual video education with verbal and written material and guidebook.  Patient  learns the recommended Pritikin exercise program. Exercise with the goal of living  a long, healthy life. Some of the health benefits of exercise include controlled diabetes, healthier blood pressure levels, improved cholesterol levels, improved heart and lung capacity, improved sleep, and better body composition. Everyone should speak with their doctor before starting or changing an exercise routine.  Biomechanical Limitations Clinical staff conducted group or individual video education with verbal and written material and guidebook.  Patient learns how biomechanical limitations can impact exercise and how we can mitigate and possibly overcome limitations to have an impactful and balanced exercise routine.  Body Composition Clinical staff conducted group or individual video education with verbal and written material and guidebook.  Patient learns that body composition (ratio of muscle mass to fat mass) is a key component to assessing overall fitness, rather than body weight alone. Increased fat mass, especially visceral belly fat, can put [place] at increased risk for metabolic syndrome, type 2 diabetes, heart disease, and even death. It is recommended to combine diet and exercise (cardiovascular and resistance training) to improve your body composition. Seek guidance from your physician and exercise physiologist before implementing an exercise routine.  Exercise Action Plan Clinical staff conducted group or individual video education with verbal and written material and guidebook.  Patient learns the recommended strategies to achieve and enjoy long-term exercise adherence, including variety, self-motivation, self-efficacy, and positive decision making. Benefits of exercise include fitness, good health, weight management, more energy, better sleep, less stress, and overall well-being.  Medical   Heart Disease Risk Reduction Clinical staff conducted group or individual video education with verbal and written material and guidebook.  Patient learns our heart is our most vital organ as it  circulates oxygen, nutrients, white blood cells, and hormones throughout the entire body, and carries waste away. Data supports a plant-based eating plan like the Pritikin Program for its effectiveness in slowing progression of and reversing heart disease. The video provides a number of recommendations to address heart disease.   Metabolic Syndrome and Belly Fat  Clinical staff conducted group or individual video education with verbal and written material and guidebook.  Patient learns what metabolic syndrome is, how it leads to heart disease, and how one can reverse it and keep it from coming back. You have metabolic syndrome if you have 3 of the following 5 criteria: abdominal obesity, high blood pressure, high triglycerides, low HDL cholesterol, and high blood sugar.  Hypertension and Heart Disease Clinical staff conducted group or individual video education with verbal and written material and guidebook.  Patient learns that high blood pressure, or hypertension, is very common in the [place]. Hypertension is largely due to excessive salt intake, but other important risk factors include being overweight, physical inactivity, drinking too much alcohol, smoking, and not eating enough potassium from fruits and vegetables. High blood pressure is a leading risk factor for heart attack, stroke, congestive heart failure, dementia, kidney failure, and premature death. Long-term effects of excessive salt intake include stiffening of the arteries and thickening of heart muscle and organ damage. Recommendations include ways to reduce hypertension and the risk of heart disease.  Diseases of Our Time - Focusing on Diabetes Clinical staff conducted group or individual video education with verbal and written material and guidebook.  Patient learns why the best way to stop diseases of our time is prevention, through food and other lifestyle changes. Medicine (such as prescription pills and surgeries) is often  only a Band-Aid on the problem, not a  long-term solution. Most common diseases of our time include obesity, type 2 diabetes, hypertension, heart disease, and cancer. The Pritikin Program is recommended and has been proven to help reduce, reverse, and/or prevent the damaging effects of metabolic syndrome.  Nutrition   Overview of the Pritikin Eating Plan  Clinical staff conducted group or individual video education with verbal and written material and guidebook.  Patient learns about the Pritikin Eating Plan for disease risk reduction. The Pritikin Eating Plan emphasizes a wide variety of unrefined, minimally-processed carbohydrates, like fruits, vegetables, whole grains, and legumes. Go, Caution, and Stop food choices are explained. Plant-based and lean animal proteins are emphasized. Rationale provided for low sodium intake for blood pressure control, low added sugars for blood sugar stabilization, and low added fats and oils for coronary artery disease risk reduction and weight management.  Calorie Density  Clinical staff conducted group or individual video education with verbal and written material and guidebook.  Patient learns about calorie density and how it impacts the Pritikin Eating Plan. Knowing the characteristics of the food you choose will help you decide whether those foods will lead to weight gain or weight loss, and whether you want to consume more or less of them. Weight loss is usually a side effect of the Pritikin Eating Plan because of its focus on low calorie-dense foods.  Label Reading  Clinical staff conducted group or individual video education with verbal and written material and guidebook.  Patient learns about the Pritikin recommended label reading guidelines and corresponding recommendations regarding calorie density, added sugars, sodium content, and whole grains.  Dining Out - Part 1  Clinical staff conducted group or individual video education with verbal and written  material and guidebook.  Patient learns that restaurant meals can be sabotaging because they can be so high in calories, fat, sodium, and/or sugar. Patient learns recommended strategies on how to positively address this and avoid unhealthy pitfalls.  Facts on Fats  Clinical staff conducted group or individual video education with verbal and written material and guidebook.  Patient learns that lifestyle modifications can be just as effective, if not more so, as many medications for lowering your risk of heart disease. A Pritikin lifestyle can help to reduce your risk of inflammation and atherosclerosis (cholesterol build-up, or plaque, in the artery walls). Lifestyle interventions such as dietary choices and physical activity address the cause of atherosclerosis. A review of the types of fats and their impact on blood cholesterol levels, along with dietary recommendations to reduce fat intake is also included.  Nutrition Action Plan  Clinical staff conducted group or individual video education with verbal and written material and guidebook.  Patient learns how to incorporate Pritikin recommendations into their lifestyle. Recommendations include planning and keeping personal health goals in mind as an important part of their success.  Healthy Mind-Set    Healthy Minds, Bodies, Hearts  Clinical staff conducted group or individual video education with verbal and written material and guidebook.  Patient learns how to identify when they are stressed. Video will discuss the impact of that stress, as well as the many benefits of stress management. Patient will also be introduced to stress management techniques. The way we think, act, and feel has an impact on our hearts.  How Our Thoughts Can Heal Our Hearts  Clinical staff conducted group or individual video education with verbal and written material and guidebook.  Patient learns that negative thoughts can cause depression and anxiety. This can result in  negative lifestyle  behavior and serious health problems. Cognitive behavioral therapy is an effective method to help control our thoughts in order to change and improve our emotional outlook.  Additional Videos:  Exercise    Improving Performance  Clinical staff conducted group or individual video education with verbal and written material and guidebook.  Patient learns to use a non-linear approach by alternating intensity levels and lengths of time spent exercising to help burn more calories and lose more body fat. Cardiovascular exercise helps improve heart health, metabolism, hormonal balance, blood sugar control, and recovery from fatigue. Resistance training improves strength, endurance, balance, coordination, reaction time, metabolism, and muscle mass. Flexibility exercise improves circulation, posture, and balance. Seek guidance from your physician and exercise physiologist before implementing an exercise routine and learn your capabilities and proper form for all exercise.  Introduction to Yoga  Clinical staff conducted group or individual video education with verbal and written material and guidebook.  Patient learns about yoga, a discipline of the coming together of mind, breath, and body. The benefits of yoga include improved flexibility, improved range of motion, better posture and core strength, increased lung function, weight loss, and positive self-image. Yoga's heart health benefits include lowered blood pressure, healthier heart rate, decreased cholesterol and triglyceride levels, improved immune function, and reduced stress. Seek guidance from your physician and exercise physiologist before implementing an exercise routine and learn your capabilities and proper form for all exercise.  Medical   Aging: Enhancing Your Quality of Life  Clinical staff conducted group or individual video education with verbal and written material and guidebook.  Patient learns key strategies and  recommendations to stay in good physical health and enhance quality of life, such as prevention strategies, having an advocate, securing a Health Care Proxy and Power of Attorney, and keeping a list of medications and system for tracking them. It also discusses how to avoid risk for bone loss.  Biology of Weight Control  Clinical staff conducted group or individual video education with verbal and written material and guidebook.  Patient learns that weight gain occurs because we consume more calories than we burn (eating more, moving less). Even if your body weight is normal, you may have higher ratios of fat compared to muscle mass. Too much body fat puts you at increased risk for cardiovascular disease, heart attack, stroke, type 2 diabetes, and obesity-related cancers. In addition to exercise, following the Pritikin Eating Plan can help reduce your risk.  Decoding Lab Results  Clinical staff conducted group or individual video education with verbal and written material and guidebook.  Patient learns that lab test reflects one measurement whose values change over time and are influenced by many factors, including medication, stress, sleep, exercise, food, hydration, pre-existing medical conditions, and more. It is recommended to use the knowledge from this video to become more involved with your lab results and evaluate your numbers to speak with your doctor.   Diseases of Our Time - Overview  Clinical staff conducted group or individual video education with verbal and written material and guidebook.  Patient learns that according to the CDC, 50% to 70% of chronic diseases (such as obesity, type 2 diabetes, elevated lipids, hypertension, and heart disease) are avoidable through lifestyle improvements including healthier food choices, listening to satiety cues, and increased physical activity.  Sleep Disorders Clinical staff conducted group or individual video education with verbal and written  material and guidebook.  Patient learns how good quality and duration of sleep are important to overall health and  well-being. Patient also learns about sleep disorders and how they impact health along with recommendations to address them, including discussing with a physician.  Nutrition  Dining Out - Part 2 Clinical staff conducted group or individual video education with verbal and written material and guidebook.  Patient learns how to plan ahead and communicate in order to maximize their dining experience in a healthy and nutritious manner. Included are recommended food choices based on the type of restaurant the patient is visiting.   Fueling a Banker conducted group or individual video education with verbal and written material and guidebook.  There is a strong connection between our food choices and our health. Diseases like obesity and type 2 diabetes are very prevalent and are in large-part due to lifestyle choices. The Pritikin Eating Plan provides plenty of food and hunger-curbing satisfaction. It is easy to follow, affordable, and helps reduce health risks.  Menu Workshop  Clinical staff conducted group or individual video education with verbal and written material and guidebook.  Patient learns that restaurant meals can sabotage health goals because they are often packed with calories, fat, sodium, and sugar. Recommendations include strategies to plan ahead and to communicate with the manager, chef, or server to help order a healthier meal.  Planning Your Eating Strategy  Clinical staff conducted group or individual video education with verbal and written material and guidebook.  Patient learns about the Pritikin Eating Plan and its benefit of reducing the risk of disease. The Pritikin Eating Plan does not focus on calories. Instead, it emphasizes high-quality, nutrient-rich foods. By knowing the characteristics of the foods, we choose, we can determine their  calorie density and make informed decisions.  Targeting Your Nutrition Priorities  Clinical staff conducted group or individual video education with verbal and written material and guidebook.  Patient learns that lifestyle habits have a tremendous impact on disease risk and progression. This video provides eating and physical activity recommendations based on your personal health goals, such as reducing LDL cholesterol, losing weight, preventing or controlling type 2 diabetes, and reducing high blood pressure.  Vitamins and Minerals  Clinical staff conducted group or individual video education with verbal and written material and guidebook.  Patient learns different ways to obtain key vitamins and minerals, including through a recommended healthy diet. It is important to discuss all supplements you take with your doctor.   Healthy Mind-Set    Smoking Cessation  Clinical staff conducted group or individual video education with verbal and written material and guidebook.  Patient learns that cigarette smoking and tobacco addiction pose a serious health risk which affects millions of people. Stopping smoking will significantly reduce the risk of heart disease, lung disease, and many forms of cancer. Recommended strategies for quitting are covered, including working with your doctor to develop a successful plan.  Culinary   Becoming a Set designer conducted group or individual video education with verbal and written material and guidebook.  Patient learns that cooking at home can be healthy, cost-effective, quick, and puts them in control. Keys to cooking healthy recipes will include looking at your recipe, assessing your equipment needs, planning ahead, making it simple, choosing cost-effective seasonal ingredients, and limiting the use of added fats, salts, and sugars.  Cooking - Breakfast and Snacks  Clinical staff conducted group or individual video education with verbal and  written material and guidebook.  Patient learns how important breakfast is to satiety and nutrition through the entire day. Recommendations  include key foods to eat during breakfast to help stabilize blood sugar levels and to prevent overeating at meals later in the day. Planning ahead is also a key component.  Cooking - Educational psychologist conducted group or individual video education with verbal and written material and guidebook.  Patient learns eating strategies to improve overall health, including an approach to cook more at home. Recommendations include thinking of animal protein as a side on your plate rather than center stage and focusing instead on lower calorie dense options like vegetables, fruits, whole grains, and plant-based proteins, such as beans. Making sauces in large quantities to freeze for later and leaving the skin on your vegetables are also recommended to maximize your experience.  Cooking - Healthy Salads and Dressing Clinical staff conducted group or individual video education with verbal and written material and guidebook.  Patient learns that vegetables, fruits, whole grains, and legumes are the foundations of the Pritikin Eating Plan. Recommendations include how to incorporate each of these in flavorful and healthy salads, and how to create homemade salad dressings. Proper handling of ingredients is also covered. Cooking - Soups and [employer] - Soups and Desserts Clinical staff conducted group or individual video education with verbal and written material and guidebook.  Patient learns that Pritikin soups and desserts make for easy, nutritious, and delicious snacks and meal components that are low in sodium, fat, sugar, and calorie density, while high in vitamins, minerals, and filling fiber. Recommendations include simple and healthy ideas for soups and desserts.   Overview     The Pritikin Solution Program Overview Clinical staff conducted group or  individual video education with verbal and written material and guidebook.  Patient learns that the results of the Pritikin Program have been documented in more than 100 articles published in peer-reviewed journals, and the benefits include reducing risk factors for (and, in some cases, even reversing) high cholesterol, high blood pressure, type 2 diabetes, obesity, and more! An overview of the three key pillars of the Pritikin Program will be covered: eating well, doing regular exercise, and having a healthy mind-set.  WORKSHOPS  Exercise: Exercise Basics: Building Your Action Plan Clinical staff led group instruction and group discussion with PowerPoint presentation and patient guidebook. To enhance the learning environment the use of posters, models and videos may be added. At the conclusion of this workshop, patients will comprehend the difference between physical activity and exercise, as well as the benefits of incorporating both, into their routine. Patients will understand the FITT (Frequency, Intensity, Time, and Type) principle and how to use it to build an exercise action plan. In addition, safety concerns and other considerations for exercise and cardiac rehab will be addressed by the presenter. The purpose of this lesson is to promote a comprehensive and effective weekly exercise routine in order to improve patients' overall level of fitness.   Managing Heart Disease: Your Path to a Healthier Heart Clinical staff led group instruction and group discussion with PowerPoint presentation and patient guidebook. To enhance the learning environment the use of posters, models and videos may be added.At the conclusion of this workshop, patients will understand the anatomy and physiology of the heart. Additionally, they will understand how Pritikin's three pillars impact the risk factors, the progression, and the management of heart disease.  The purpose of this lesson is to provide a high-level  overview of the heart, heart disease, and how the Pritikin lifestyle positively impacts risk factors.  Exercise Biomechanics  Clinical staff led group instruction and group discussion with PowerPoint presentation and patient guidebook. To enhance the learning environment the use of posters, models and videos may be added. Patients will learn how the structural parts of their bodies function and how these functions impact their daily activities, movement, and exercise. Patients will learn how to promote a neutral spine, learn how to manage pain, and identify ways to improve their physical movement in order to promote healthy living. The purpose of this lesson is to expose patients to common physical limitations that impact physical activity. Participants will learn practical ways to adapt and manage aches and pains, and to minimize their effect on regular exercise. Patients will learn how to maintain good posture while sitting, walking, and lifting.  Balance Training and Fall Prevention  Clinical staff led group instruction and group discussion with PowerPoint presentation and patient guidebook. To enhance the learning environment the use of posters, models and videos may be added. At the conclusion of this workshop, patients will understand the importance of their sensorimotor skills (vision, proprioception, and the vestibular system) in maintaining their ability to balance as they age. Patients will apply a variety of balancing exercises that are appropriate for their current level of function. Patients will understand the common causes for poor balance, possible solutions to these problems, and ways to modify their physical environment in order to minimize their fall risk. The purpose of this lesson is to teach patients about the importance of maintaining balance as they age and ways to minimize their risk of falling.  WORKSHOPS   Nutrition:  Fueling a Ship broker led group  instruction and group discussion with PowerPoint presentation and patient guidebook. To enhance the learning environment the use of posters, models and videos may be added. Patients will review the foundational principles of the Pritikin Eating Plan and understand what constitutes a serving size in each of the food groups. Patients will also learn Pritikin-friendly foods that are better choices when away from home and review make-ahead meal and snack options. Calorie density will be reviewed and applied to three nutrition priorities: weight maintenance, weight loss, and weight gain. The purpose of this lesson is to reinforce (in a group setting) the key concepts around what patients are recommended to eat and how to apply these guidelines when away from home by planning and selecting Pritikin-friendly options. Patients will understand how calorie density may be adjusted for different weight management goals.  Mindful Eating  Clinical staff led group instruction and group discussion with PowerPoint presentation and patient guidebook. To enhance the learning environment the use of posters, models and videos may be added. Patients will briefly review the concepts of the Pritikin Eating Plan and the importance of low-calorie dense foods. The concept of mindful eating will be introduced as well as the importance of paying attention to internal hunger signals. Triggers for non-hunger eating and techniques for dealing with triggers will be explored. The purpose of this lesson is to provide patients with the opportunity to review the basic principles of the Pritikin Eating Plan, discuss the value of eating mindfully and how to measure internal cues of hunger and fullness using the Hunger Scale. Patients will also discuss reasons for non-hunger eating and learn strategies to use for controlling emotional eating.  Targeting Your Nutrition Priorities Clinical staff led group instruction and group discussion with  PowerPoint presentation and patient guidebook. To enhance the learning environment the use of posters, models and videos may be added. Patients  will learn how to determine their genetic susceptibility to disease by reviewing their family history. Patients will gain insight into the importance of diet as part of an overall healthy lifestyle in mitigating the impact of genetics and other environmental insults. The purpose of this lesson is to provide patients with the opportunity to assess their personal nutrition priorities by looking at their family history, their own health history and current risk factors. Patients will also be able to discuss ways of prioritizing and modifying the Pritikin Eating Plan for their highest risk areas  Menu  Clinical staff led group instruction and group discussion with PowerPoint presentation and patient guidebook. To enhance the learning environment the use of posters, models and videos may be added. Using menus brought in from [employer], or printed from [employer], patients will apply the Pritikin dining out guidelines that were presented in the [employer] video. Patients will also be able to practice these guidelines in a variety of provided scenarios. The purpose of this lesson is to provide patients with the opportunity to practice hands-on learning of the Pritikin Dining Out guidelines with actual menus and practice scenarios.  Label Reading Clinical staff led group instruction and group discussion with PowerPoint presentation and patient guidebook. To enhance the learning environment the use of posters, models and videos may be added. Patients will review and discuss the Pritikin label reading guidelines presented in [employer]'s Label Reading Educational series video. Using fool labels brought in from local grocery stores and markets, patients will apply the label reading guidelines and determine if the packaged food meet the Pritikin  guidelines. The purpose of this lesson is to provide patients with the opportunity to review, discuss, and practice hands-on learning of the Pritikin Label Reading guidelines with actual packaged food labels. Cooking School  [employer]in's [employer] are designed to teach patients ways to prepare quick, simple, and affordable recipes at home. The importance of nutrition's role in chronic disease risk reduction is reflected in its emphasis in the overall Pritikin program. By learning how to prepare essential core Pritikin Eating Plan recipes, patients will increase control over what they eat; be able to customize the flavor of foods without the use of added salt, sugar, or fat; and improve the quality of the food they consume. By learning a set of core recipes which are easily assembled, quickly prepared, and affordable, patients are more likely to prepare more healthy foods at home. These workshops focus on convenient breakfasts, simple entres, side dishes, and desserts which can be prepared with minimal effort and are consistent with nutrition recommendations for cardiovascular risk reduction. Cooking [employer] are taught by a Armed forces logistics/support/administrative officer (RD) who has been trained by the [employer]. The chef or RD has a clear understanding of the importance of minimizing - if not completely eliminating - added fat, sugar, and sodium in recipes. Throughout the series of Cooking School Workshop sessions, patients will learn about healthy ingredients and efficient methods of cooking to build confidence in their capability to prepare    Cooking School weekly topics:  Adding Flavor- Sodium-Free  Fast and Healthy Breakfasts  Powerhouse Plant-Based Proteins  Satisfying Salads and Dressings  Simple Sides and Sauces  International Cuisine-Spotlight on the [employer] Zones  Delicious Desserts  Savory Soups  [employer] - Meals in a Astronomer Appetizers and  Snacks  Comforting Weekend Breakfasts  One-Pot Wonders   Fast Evening Meals  Landscape architect Your  Pritikin Plate  WORKSHOPS   Healthy Mindset (Psychosocial):  Focused Goals, Sustainable Changes Clinical staff led group instruction and group discussion with PowerPoint presentation and patient guidebook. To enhance the learning environment the use of posters, models and videos may be added. Patients will be able to apply effective goal setting strategies to establish at least one personal goal, and then take consistent, meaningful action toward that goal. They will learn to identify common barriers to achieving personal goals and develop strategies to overcome them. Patients will also gain an understanding of how our mind-set can impact our ability to achieve goals and the importance of cultivating a positive and growth-oriented mind-set. The purpose of this lesson is to provide patients with a deeper understanding of how to set and achieve personal goals, as well as the tools and strategies needed to overcome common obstacles which may arise along the way.  From Head to Heart: The Power of a Healthy Outlook  Clinical staff led group instruction and group discussion with PowerPoint presentation and patient guidebook. To enhance the learning environment the use of posters, models and videos may be added. Patients will be able to recognize and describe the impact of emotions and mood on physical health. They will discover the importance of self-care and explore self-care practices which may work for them. Patients will also learn how to utilize the 4 C's to cultivate a healthier outlook and better manage stress and challenges. The purpose of this lesson is to demonstrate to patients how a healthy outlook is an essential part of maintaining good health, especially as they continue their cardiac rehab journey.  Healthy Sleep for a Healthy Heart Clinical staff led group instruction and  group discussion with PowerPoint presentation and patient guidebook. To enhance the learning environment the use of posters, models and videos may be added. At the conclusion of this workshop, patients will be able to demonstrate knowledge of the importance of sleep to overall health, well-being, and quality of life. They will understand the symptoms of, and treatments for, common sleep disorders. Patients will also be able to identify daytime and nighttime behaviors which impact sleep, and they will be able to apply these tools to help manage sleep-related challenges. The purpose of this lesson is to provide patients with a general overview of sleep and outline the importance of quality sleep. Patients will learn about a few of the most common sleep disorders. Patients will also be introduced to the concept of "sleep hygiene," and discover ways to self-manage certain sleeping problems through simple daily behavior changes. Finally, the workshop will motivate patients by clarifying the links between quality sleep and their goals of heart-healthy living.   Recognizing and Reducing Stress Clinical staff led group instruction and group discussion with PowerPoint presentation and patient guidebook. To enhance the learning environment the use of posters, models and videos may be added. At the conclusion of this workshop, patients will be able to understand the types of stress reactions, differentiate between acute and chronic stress, and recognize the impact that chronic stress has on their health. They will also be able to apply different coping mechanisms, such as reframing negative self-talk. Patients will have the opportunity to practice a variety of stress management techniques, such as deep abdominal breathing, progressive muscle relaxation, and/or guided imagery.  The purpose of this lesson is to educate patients on the role of stress in their lives and to provide healthy techniques for coping with  it.  Learning Barriers/Preferences:  Learning Barriers/Preferences -  10/11/23 1114       Learning Barriers/Preferences   Learning Barriers None    Learning Preferences Written Material;Pictoral;Group Instruction;Computer/Internet;Individual Instruction;Video;Skilled Demonstration             Education Topics:  Knowledge Questionnaire Score:  Knowledge Questionnaire Score - 10/11/23 1319       Knowledge Questionnaire Score   Pre Score 22/24             Core Components/Risk Factors/Patient Goals at Admission:  Personal Goals and Risk Factors at Admission - 10/11/23 1114       Core Components/Risk Factors/Patient Goals on Admission    Weight Management Yes;Obesity;Weight Loss    Intervention Weight Management/Obesity: Establish reasonable short term and long term weight goals.;Obesity: Provide education and appropriate resources to help participant work on and attain dietary goals.    Admit Weight 200 lb 13.4 oz (91.1 kg)    Expected Outcomes Short Term: Continue to assess and modify interventions until short term weight is achieved;Long Term: Adherence to nutrition and physical activity/exercise program aimed toward attainment of established weight goal;Weight Loss: Understanding of general recommendations for a balanced deficit meal plan, which promotes 1-2 lb weight loss per week and includes a negative energy balance of [id number] kcal/d    Diabetes Yes    Intervention Provide education about signs/symptoms and action to take for hypo/hyperglycemia.;Provide education about proper nutrition, including hydration, and aerobic/resistive exercise prescription along with prescribed medications to achieve blood glucose in normal ranges: Fasting glucose 65-99 mg/dL    Expected Outcomes Short Term: Participant verbalizes understanding of the signs/symptoms and immediate care of hyper/hypoglycemia, proper foot care and importance of medication, aerobic/resistive exercise and nutrition  plan for blood glucose control.;Long Term: Attainment of HbA1C < 7%.    Hypertension Yes    Intervention Provide education on lifestyle modifcations including regular physical activity/exercise, weight management, moderate sodium restriction and increased consumption of fresh fruit, vegetables, and low fat dairy, alcohol moderation, and smoking cessation.;Monitor prescription use compliance.    Expected Outcomes Short Term: Continued assessment and intervention until BP is < 140/46mm HG in hypertensive participants. < 130/81mm HG in hypertensive participants with diabetes, heart failure or chronic kidney disease.;Long Term: Maintenance of blood pressure at goal levels.    Lipids Yes    Intervention Provide education and support for participant on nutrition & aerobic/resistive exercise along with prescribed medications to achieve LDL 70mg , HDL >40mg .    Expected Outcomes Short Term: Participant states understanding of desired cholesterol values and is compliant with medications prescribed. Participant is following exercise prescription and nutrition guidelines.;Long Term: Cholesterol controlled with medications as prescribed, with individualized exercise RX and with personalized nutrition plan. Value goals: LDL < 70mg , HDL > 40 mg.    Personal Goal Other Yes    Personal Goal Walk more often each day. Develop a daily exercise routine. Learn better cooking habits. Make better food choices.    Intervention Provide individualized exercise plan that Latasia can follow at home. She will attend cooking school to learn heart healthy meals that she can make at home.    Expected Outcomes Malea will comply with individualized exercise plan including walking at home. She will follow individualized nutrition action plan.             Core Components/Risk Factors/Patient Goals Review:   Goals and Risk Factor Review     Row Name 10/18/23 0828 11/01/23 1617 11/18/23 0839         Core Components/Risk  Factors/Patient Goals Review  Personal Goals Review Weight Management/Obesity;Hypertension;Lipids;Diabetes Weight Management/Obesity;Hypertension;Lipids;Diabetes Weight Management/Obesity;Hypertension;Lipids;Diabetes     Review Charley started cardiac rehab on 10/17/23. Lynora did well with exercise.Girlee was hypoglycemic post exercise and was treated with juice and glucose gel. Zaara started cardiac rehab on 10/17/23. Brittanye is off to a good start  to  exercise. Vital signs have been stable. CBG's have been variable. Halina has been adjusting her basal insulin via her insulin pump. Gretchin is doing well with exercise at  cardiac rehab on 10/17/23. Vital signs  and CBG's have  been stable. Amiryah has lost 1.3 kg since starting cardiac rehab/     Expected Outcomes Felissa will continue to participate in cardiac rehab for exercise, nutrition and lifestyle modifications. CBG's will be within noram parameters E[place]ka will continue to participate in cardiac rehab for exercise, nutrition and lifestyle modifications. CBG's will be within noram parameters Kalis will continue to participate in cardiac rehab for exercise, nutrition and lifestyle modifications. CBG's will be within normal  parameters              Core Components/Risk Factors/Patient Goals at Discharge (Final Review):   Goals and Risk Factor Review - 11/18/23 0839       Core Components/Risk Factors/Patient Goals Review   Personal Goals Review Weight Management/Obesity;Hypertension;Lipids;Diabetes    Review Calisha is doing well with exercise at  cardiac rehab on 10/17/23. Vital signs  and CBG's have  been stable. Keitlyn has lost 1.3 kg since starting cardiac rehab/    Expected Outcomes Liba will continue to participate in cardiac rehab for exercise, nutrition and lifestyle modifications. CBG's will be within normal  parameters             ITP Comments:  ITP Comments     Row Name 10/11/23 1019 10/18/23 0823 11/01/23  1616 11/18/23 0836     ITP Comments Medical Director- Dr. Armanda Magic, MD. Introduction to the [facility]in Education Program / Intensive Cardiac Rehab. Reviewed intital orientation folder with participant. 30 Day ITP Review. Vern started cardiac rehab on 10/17/23. Zavanna did well with exercise. Alek was hypoglycemic post exercise and was treated with juice and glucose gel. 30 Day ITP Review. Allysha started cardiac rehab on 10/17/23. Doralee is off to a good start to exercise at cardiac rehab. 30 Day ITP Review. Kieran has good attendance and participation during exercise at cardiac rehab.  cardiac rehab             Comments: See ITP comments.Thayer Headings RN BSN

## 2023-11-21 ENCOUNTER — Encounter (HOSPITAL_COMMUNITY)
Admission: RE | Admit: 2023-11-21 | Discharge: 2023-11-21 | Disposition: A | Discharge status: Home / Self Care | Payer: Medicare Other | Source: Ambulatory Visit | Attending: Cardiovascular Disease

## 2023-11-21 DIAGNOSIS — Z951 Presence of aortocoronary bypass graft: Secondary | ICD-10-CM

## 2023-11-21 DIAGNOSIS — E162 Hypoglycemia, unspecified: Secondary | ICD-10-CM

## 2023-11-21 DIAGNOSIS — I214 Non-ST elevation (NSTEMI) myocardial infarction: Secondary | ICD-10-CM | POA: Diagnosis not present

## 2023-11-23 ENCOUNTER — Encounter (HOSPITAL_COMMUNITY)
Admission: RE | Admit: 2023-11-23 | Discharge: 2023-11-23 | Disposition: A | Discharge status: Home / Self Care | Payer: Medicare Other | Source: Ambulatory Visit | Attending: Cardiovascular Disease | Admitting: Cardiovascular Disease

## 2023-11-23 DIAGNOSIS — Z951 Presence of aortocoronary bypass graft: Secondary | ICD-10-CM | POA: Diagnosis not present

## 2023-11-23 DIAGNOSIS — I214 Non-ST elevation (NSTEMI) myocardial infarction: Secondary | ICD-10-CM | POA: Diagnosis not present

## 2023-11-25 ENCOUNTER — Encounter (HOSPITAL_COMMUNITY)
Admission: RE | Admit: 2023-11-25 | Discharge: 2023-11-25 | Disposition: A | Discharge status: Home / Self Care | Payer: Medicare Other | Source: Ambulatory Visit | Attending: Cardiovascular Disease | Admitting: Cardiovascular Disease

## 2023-11-25 DIAGNOSIS — I214 Non-ST elevation (NSTEMI) myocardial infarction: Secondary | ICD-10-CM | POA: Diagnosis not present

## 2023-11-25 DIAGNOSIS — Z951 Presence of aortocoronary bypass graft: Secondary | ICD-10-CM | POA: Diagnosis not present

## 2023-11-28 ENCOUNTER — Encounter (HOSPITAL_COMMUNITY)
Admission: RE | Admit: 2023-11-28 | Discharge: 2023-11-28 | Disposition: A | Discharge status: Home / Self Care | Payer: Medicare Other | Source: Ambulatory Visit | Attending: Cardiovascular Disease | Admitting: Cardiovascular Disease

## 2023-11-28 ENCOUNTER — Telehealth (HOSPITAL_COMMUNITY): Payer: Self-pay

## 2023-11-28 DIAGNOSIS — Z951 Presence of aortocoronary bypass graft: Secondary | ICD-10-CM

## 2023-11-28 DIAGNOSIS — I214 Non-ST elevation (NSTEMI) myocardial infarction: Secondary | ICD-10-CM | POA: Diagnosis not present

## 2023-11-28 NOTE — Telephone Encounter (Signed)
-----   Message from Reatha Harps sent at 11/25/2023  5:53 PM EST ----- Regarding: RE: Jog request OK to job.   Gerri Spore T. Flora Lipps, MD, [facility]  [facility]  [address], [address] [place], [place] [address] [phone]  5:53 PM ----- Message ----- From: Harrie Jeans Sent: 11/25/2023   2:48 PM EST To: Sande Rives, MD Subject: Jog request                                    CARDIAC REHAB PHASE 2  Your patient Kerry Compton has been in the cardiac rehabilitation program approximately 6 weeks and is doing well. Patient would like to begin jogging in intervals. Her current MET level is 2.76. Please indicate if you are agreeable to this change in exercise prescription.  We appreciate your assistance and referral of your patient to our program, Harrie Jeans ACSM-CEP 11/25/2023 2:45 PM

## 2023-11-29 DIAGNOSIS — I10 Essential (primary) hypertension: Secondary | ICD-10-CM | POA: Diagnosis not present

## 2023-11-29 DIAGNOSIS — Z794 Long term (current) use of insulin: Secondary | ICD-10-CM | POA: Diagnosis not present

## 2023-11-29 DIAGNOSIS — Z8639 Personal history of other endocrine, nutritional and metabolic disease: Secondary | ICD-10-CM | POA: Diagnosis not present

## 2023-11-29 DIAGNOSIS — E109 Type 1 diabetes mellitus without complications: Secondary | ICD-10-CM | POA: Diagnosis not present

## 2023-11-30 ENCOUNTER — Encounter (HOSPITAL_COMMUNITY)
Admission: RE | Admit: 2023-11-30 | Discharge: 2023-11-30 | Disposition: A | Discharge status: Home / Self Care | Payer: Medicare Other | Source: Ambulatory Visit | Attending: Cardiovascular Disease

## 2023-11-30 DIAGNOSIS — I214 Non-ST elevation (NSTEMI) myocardial infarction: Secondary | ICD-10-CM

## 2023-11-30 DIAGNOSIS — Z951 Presence of aortocoronary bypass graft: Secondary | ICD-10-CM | POA: Diagnosis not present

## 2023-11-30 NOTE — Progress Notes (Signed)
Patient report feeling nauseated. Went into the bathroom and vomited. CBG 157. Blood pressure 118/72. Heart rate 94. Telemetry rhythm Sinus. Patient denies chest pain. Asya said that her nausea was GI related. Ian did not  stay to complete her exercise today after exercising on the treadmill. Thayer Headings RN BSN

## 2023-12-02 ENCOUNTER — Encounter (HOSPITAL_COMMUNITY)
Admission: RE | Admit: 2023-12-02 | Discharge: 2023-12-02 | Disposition: A | Discharge status: Home / Self Care | Payer: Medicare Other | Source: Ambulatory Visit | Attending: Cardiovascular Disease | Admitting: Cardiovascular Disease

## 2023-12-02 DIAGNOSIS — Z951 Presence of aortocoronary bypass graft: Secondary | ICD-10-CM | POA: Diagnosis not present

## 2023-12-02 DIAGNOSIS — I214 Non-ST elevation (NSTEMI) myocardial infarction: Secondary | ICD-10-CM

## 2023-12-05 ENCOUNTER — Encounter (HOSPITAL_COMMUNITY)
Admission: RE | Admit: 2023-12-05 | Discharge: 2023-12-05 | Disposition: A | Discharge status: Home / Self Care | Payer: Medicare Other | Source: Ambulatory Visit | Attending: Cardiovascular Disease

## 2023-12-05 DIAGNOSIS — Z951 Presence of aortocoronary bypass graft: Secondary | ICD-10-CM | POA: Diagnosis not present

## 2023-12-05 DIAGNOSIS — I214 Non-ST elevation (NSTEMI) myocardial infarction: Secondary | ICD-10-CM | POA: Diagnosis not present

## 2023-12-09 ENCOUNTER — Encounter (HOSPITAL_COMMUNITY): Payer: Medicare Other

## 2023-12-12 ENCOUNTER — Encounter (HOSPITAL_COMMUNITY): Payer: Medicare Other

## 2023-12-16 ENCOUNTER — Encounter (HOSPITAL_COMMUNITY)
Admission: RE | Admit: 2023-12-16 | Discharge: 2023-12-16 | Disposition: A | Discharge status: Home / Self Care | Payer: Medicare Other | Source: Ambulatory Visit | Attending: Cardiology | Admitting: Cardiology

## 2023-12-16 DIAGNOSIS — Z951 Presence of aortocoronary bypass graft: Secondary | ICD-10-CM | POA: Insufficient documentation

## 2023-12-16 DIAGNOSIS — E162 Hypoglycemia, unspecified: Secondary | ICD-10-CM | POA: Diagnosis not present

## 2023-12-16 DIAGNOSIS — I252 Old myocardial infarction: Secondary | ICD-10-CM | POA: Insufficient documentation

## 2023-12-16 DIAGNOSIS — I214 Non-ST elevation (NSTEMI) myocardial infarction: Secondary | ICD-10-CM

## 2023-12-16 DIAGNOSIS — Z48812 Encounter for surgical aftercare following surgery on the circulatory system: Secondary | ICD-10-CM | POA: Insufficient documentation

## 2023-12-19 ENCOUNTER — Encounter (HOSPITAL_COMMUNITY)
Admission: RE | Admit: 2023-12-19 | Discharge: 2023-12-19 | Disposition: A | Discharge status: Home / Self Care | Payer: Medicare Other | Source: Ambulatory Visit | Attending: Cardiovascular Disease | Admitting: Cardiovascular Disease

## 2023-12-19 DIAGNOSIS — I252 Old myocardial infarction: Secondary | ICD-10-CM | POA: Diagnosis not present

## 2023-12-19 DIAGNOSIS — E162 Hypoglycemia, unspecified: Secondary | ICD-10-CM | POA: Diagnosis not present

## 2023-12-19 DIAGNOSIS — Z48812 Encounter for surgical aftercare following surgery on the circulatory system: Secondary | ICD-10-CM | POA: Diagnosis not present

## 2023-12-19 DIAGNOSIS — I214 Non-ST elevation (NSTEMI) myocardial infarction: Secondary | ICD-10-CM

## 2023-12-19 DIAGNOSIS — Z951 Presence of aortocoronary bypass graft: Secondary | ICD-10-CM | POA: Diagnosis not present

## 2023-12-20 ENCOUNTER — Ambulatory Visit: Payer: Medicare Other | Admitting: Podiatry

## 2023-12-20 NOTE — Progress Notes (Signed)
 Cardiac Individual Treatment Plan  Patient Details  Name: Kerry Compton MRN: [id number] Date of Birth: 04-24-1956 Referring Provider:   Flowsheet Row INTENSIVE CARDIAC REHAB ORIENT from 10/11/2023 in [facility], V[facility], & [facility]  Referring Provider O'Neal, Darryle Ned, MD       Initial Encounter Date:  Flowsheet Row INTENSIVE CARDIAC REHAB ORIENT from 10/11/2023 in [facility] for Heart, Vascular, & Lung Health  Date 10/11/23       Visit Diagnosis: 09/09/23 CABG x 2  09/06/23 NSTEMI  Patient's Home Medications on Admission:  Current Outpatient Medications:    acetaminophen  (TYLENOL ) 500 MG tablet, Take 1-2 tablets (500-1,000 mg total) by mouth every 6 (six) hours as needed., Disp: , Rfl:    amLODipine  (NORVASC ) 2.5 MG tablet, Take 2.5 mg by mouth daily., Disp: , Rfl:    aspirin  EC 81 MG tablet, Take 1 tablet (81 mg total) by mouth daily., Disp: , Rfl:    atorvastatin  (LIPITOR ) 80 MG tablet, Take 80 mg by mouth daily.  , Disp: , Rfl:    beclomethasone (QVAR REDIHALER) 40 MCG/ACT inhaler, Inhale 1 puff into the lungs daily., Disp: , Rfl:    Cholecalciferol (VITAMIN D3) 50 MCG (2000 UT) capsule, Take 2,000 Units by mouth daily., Disp: , Rfl:    clopidogrel  (PLAVIX ) 75 MG tablet, Take 1 tablet (75 mg total) by mouth daily., Disp: 90 tablet, Rfl: 1   Continuous Glucose Sensor (GUARDIAN 4 GLUCOSE SENSOR) MISC, Place 1 Application onto the skin See admin instructions. Every 6 days, Disp: , Rfl:    Evolocumab  (REPATHA  SURECLICK) 140 MG/ML SOAJ, Inject 140 mg into the skin every 14 (fourteen) days. (Patient not taking: Reported on 10/11/2023), Disp: 6 mL, Rfl: 1   fexofenadine (ALLEGRA) 180 MG tablet, Take 180 mg by mouth daily., Disp: , Rfl:    fluticasone  (FLONASE  ALLERGY RELIEF) 50 MCG/ACT nasal spray, Place 1 spray into both nostrils daily., Disp: , Rfl:    HUMALOG 100 UNIT/ML injection, Inject 77 Units into the  skin once., Disp: , Rfl:    Insulin  Human (INSULIN  PUMP) 100 unit/ml SOLN, Inject into the skin continuous. Humalog insulin  pump basal rate 1200 am to 300 am = 1.0 600 am to 600 pm = 0.90, Disp: , Rfl:    Insulin  Infusion Pump Supplies (MINIMED MIO ADVANCE INFUSE SET) MISC, 1 Application by Intra-arterial Infusion route See admin instructions. Every three days, Disp: , Rfl:    metoprolol  tartrate (LOPRESSOR ) 25 MG tablet, Take 1 tablet (25 mg total) by mouth 2 (two) times daily., Disp: 180 tablet, Rfl: 1   Multiple Vitamin (MULTIVITAMIN WITH MINERALS) TABS tablet, Take 1 tablet by mouth daily., Disp: , Rfl:    omeprazole (PRILOSEC) 20 MG capsule, 1 capsule 30 minutes before morning meal, Disp: , Rfl:    oxybutynin  (DITROPAN -XL) 10 MG 24 hr tablet, Take 10 mg by mouth daily., Disp: , Rfl:    oxyCODONE  (OXY IR/ROXICODONE ) 5 MG immediate release tablet, Take 1 tablet (5 mg total) by mouth every 4 (four) hours as needed for severe pain. 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, no current liver problems  Hyperlipemia    Hypertension     Tobacco Use: Social History   Tobacco Use  Smoking Status Never  Smokeless Tobacco Never    Labs: Review Flowsheet  More data exists      Latest Ref Rng & Units 02/09/2015 08/27/2015 09/07/2023 09/08/2023 09/09/2023  Labs for ITP Cardiac and Pulmonary Rehab  Cholestrol 0 - 200 mg/dL - - 862  - -  LDL (calc) 0 - 99 mg/dL - - 77  - -  HDL-C >59 mg/dL - - 50  - -  Trlycerides <150 mg/dL - - 50  - -  Hemoglobin A1c 4.8 - 5.6 % 7.6  - 7.0  - -  PH, Arterial 7.35 - 7.45 - - - 7.444  7.304  7.319  7.327  7.451   PCO2 arterial 32 - 48 mmHg - - - 37.1  43.0  44.8  44.8  34.6   Bicarbonate 20.0 - 28.0 mmol/L -  18.9  - 25.5  21.4  23.2  23.6  23.9  24.1   TCO2 22 - 32 mmol/L - 20  - 27  23  25  25  24  26  25  25  23  28    Acid-base deficit 0.0 - 2.0 mmol/L - 8.0  - - 5.0  3.0  3.0   O2 Saturation % - 43.0  - 94  96  94  94  80  100     Details       Multiple values from one day are sorted in reverse-chronological order         Capillary Blood Glucose: Lab Results  Component Value Date   GLUCAP 238 (H) 10/31/2023   GLUCAP 104 (H) 10/28/2023   GLUCAP 230 (H) 10/19/2023   GLUCAP 162 (H) 10/19/2023   GLUCAP 90 10/17/2023     Exercise Target Goals: Exercise Program Goal: Individual exercise prescription set using results from initial 6 min walk test and THRR while considering  patient's activity barriers and safety.   Exercise Prescription Goal: Initial exercise prescription builds to 30-45 minutes a day of aerobic activity, 2-3 days per week.  Home exercise guidelines will be given to patient during program as part of exercise prescription that the participant will acknowledge.  Activity Barriers & Risk Stratification:  Activity Barriers & Cardiac Risk Stratification - 10/11/23 1038       Activity Barriers & Cardiac Risk Stratification   Activity Barriers None    Cardiac Risk Stratification High             6 Minute Walk:  6 Minute Walk     Row Name 10/11/23 1155         6 Minute Walk   Phase Initial     Distance 1374 feet     Walk Time 6 minutes     # of Rest Breaks 0     MPH 2.6     METS 2.83     RPE 9     Perceived Dyspnea  0     VO2 Peak 9.9     Symptoms No     Resting HR 75 bpm     Resting BP 110/62     Resting Oxygen Saturation  96 %     Exercise Oxygen Saturation  during 6 min walk 98 %     Max Ex. HR 94 bpm     Max Ex. BP 138/62     2 Minute Post BP 128/70  Oxygen Initial Assessment:   Oxygen Re-Evaluation:   Oxygen Discharge (Final Oxygen Re-Evaluation):   Initial Exercise Prescription:  Initial Exercise Prescription -  11/07/23 1639       Treadmill   MPH --   2.0 speed, no incline   METs --   2.53 METs at 2/0            Perform Capillary Blood Glucose checks as needed.  Exercise Prescription Changes:   Exercise Prescription Changes     Row Name 10/17/23 1651 11/07/23 1640 11/28/23 1702 12/16/23 1645       Response to Exercise   Blood Pressure (Admit) 112/72 98/60 104/68 100/70    Blood Pressure (Exercise) 122/62 148/72 118/82 --  No BP needed, over 10 exercise session    Blood Pressure (Exit) 122/62 102/60 108/60 102/60    Heart Rate (Admit) 89 bpm 80 bpm 83 bpm 85 bpm    Heart Rate (Exercise) 108 bpm 109 bpm 116 bpm 126 bpm    Heart Rate (Exit) 95 bpm 89 bpm 91 bpm 87 bpm    Rating of Perceived Exertion (Exercise) 11 11 11.5 11    Perceived Dyspnea (Exercise) 0 0 0 0    Symptoms none none none none    Comments Pt first day in the [facility] ICR program Reviewedd MET's, goals and home ExRx Reviewed MET's and goals Reviewed MET's    Duration Progress to 30 minutes of  aerobic without signs/symptoms of physical distress Progress to 30 minutes of  aerobic without signs/symptoms of physical distress Progress to 30 minutes of  aerobic without signs/symptoms of physical distress Progress to 30 minutes of  aerobic without signs/symptoms of physical distress    Intensity THRR unchanged THRR unchanged THRR unchanged THRR unchanged      Progression   Progression Continue to progress workloads to maintain intensity without signs/symptoms of physical distress. Continue to progress workloads to maintain intensity without signs/symptoms of physical distress. Continue to progress workloads to maintain intensity without signs/symptoms of physical distress. Continue to progress workloads to maintain intensity without signs/symptoms of physical distress.    Average METs 2.37 2.45 2.91 2.99      Resistance Training   Training Prescription Yes Yes Yes Yes    Weight 2 lbs 2 lbs 2 lbs 2 lbs    Reps 10-15 10-15  10-15 10-15    Time 10 Minutes 10 Minutes 10 Minutes 10 Minutes      Interval Training   Interval Training -- -- Yes Yes    Equipment -- -- Treadmill Treadmill    Comments -- -- 2 min walk 1 min jog 2 min walk 1 min jog      Treadmill   MPH 2.2  2.0 speed, no incline 2.2 2.2  Jog 3.3 is 3.53 MET's 2.4  Jog 3.4 is 6.21 MET's    Grade 0 0 0 0    Minutes 15 15 15 15     METs 2.68  2.53 METs at 2/0 2.68 2.68 2.84      NuStep   Level 1 2 2 2     SPM 85 106 107 115    Minutes 15 15 15 15     METs 2.5 2.2 2.5 3      Home Exercise Plan   Plans to continue exercise at -- Home (comment) Home (comment) Home (comment)    Frequency -- Add 4 additional days to program exercise sessions. Add 4 additional days to program exercise sessions. Add 4 additional days to  program exercise sessions.    Initial Home Exercises Provided -- 11/07/23 11/07/23 11/07/23             Exercise Comments:   Exercise Comments     Row Name 10/17/23 1655 11/07/23 1652 11/28/23 1707 12/16/23 1648     Exercise Comments Pt first day in the [facility] ICR program. Pt tolerated exercise well with an average MET level of 2.37. Pt is learning her THRR, RPE and ExRx. Will continue to monitor pt and progress workloads as tolerated without sign or symptom Reviewed MET's, goals and home ExRx. Pt tolerated exercise well with an average MET level of 2.45. Pt is feeling good about her goals and is getting into a good exercise routine. She is walking on the days she does not come to cardiac rehab. She is also adding in some handweights and stretches. She feels good about her exercise plan and is gaining strength and stamina. she is meeting ACSM recommendations for 30 mins 3-4 days Reviewed MET's and goals. Pt tolerated exercise well with an average MET level of 2.91. Pt is feeling good and started jogging in intervals today. She did very well. She is increasing strength, stamina and is enjoying her new exercise routine Reviewed MET's. Pt  tolerated exercise well with an average MET level of 2.99. Pt is doing very well and progressing MET's. She says she is feeling a lot better and is increasing strength and stamina             Exercise Goals and Review:   Exercise Goals     Row Name 10/11/23 1038             Exercise Goals   Increase Physical Activity Yes       Intervention Provide advice, education, support and counseling about physical activity/exercise needs.;Develop an individualized exercise prescription for aerobic and resistive training based on initial evaluation findings, risk stratification, comorbidities and participant's personal goals.       Expected Outcomes Short Term: Attend rehab on a regular basis to increase amount of physical activity.;Long Term: Add in home exercise to make exercise part of routine and to increase amount of physical activity.;Long Term: Exercising regularly at least 3-5 days a week.       Increase Strength and Stamina Yes       Intervention Provide advice, education, support and counseling about physical activity/exercise needs.;Develop an individualized exercise prescription for aerobic and resistive training based on initial evaluation findings, risk stratification, comorbidities and participant's personal goals.       Expected Outcomes Short Term: Increase workloads from initial exercise prescription for resistance, speed, and METs.;Short Term: Perform resistance training exercises routinely during rehab and add in resistance training at home;Long Term: Improve cardiorespiratory fitness, muscular endurance and strength as measured by increased METs and functional capacity ( )       Able to understand and use rate of perceived exertion (RPE) scale Yes       Intervention Provide education and explanation on how to use RPE scale       Expected Outcomes Short Term: Able to use RPE daily in rehab to express subjective intensity level;Long Term:  Able to use RPE to guide intensity level  when exercising independently       Knowledge and understanding of Target Heart Rate Range (THRR) Yes       Intervention Provide education and explanation of THRR including how the numbers were predicted and where they are located for reference  Expected Outcomes Short Term: Able to state/look up THRR;Long Term: Able to use THRR to govern intensity when exercising independently;Short Term: Able to use daily as guideline for intensity in rehab       Able to check pulse independently Yes       Intervention Provide education and demonstration on how to check pulse in carotid and radial arteries.;Review the importance of being able to check your own pulse for safety during independent exercise       Expected Outcomes Short Term: Able to explain why pulse checking is important during independent exercise;Long Term: Able to check pulse independently and accurately       Understanding of Exercise Prescription Yes       Intervention Provide education, explanation, and written materials on patient's individual exercise prescription       Expected Outcomes Short Term: Able to explain program exercise prescription;Long Term: Able to explain home exercise prescription to exercise independently                Exercise Goals Re-Evaluation :  Exercise Goals Re-Evaluation     Row Name 10/17/23 1654 11/07/23 1643 11/28/23 1704         Exercise Goal Re-Evaluation   Exercise Goals Review Increase Physical Activity;Understanding of Exercise Prescription;Increase Strength and Stamina;Knowledge and understanding of Target Heart Rate Range (THRR);Able to understand and use rate of perceived exertion (RPE) scale Increase Physical Activity;Understanding of Exercise Prescription;Increase Strength and Stamina;Knowledge and understanding of Target Heart Rate Range (THRR);Able to understand and use rate of perceived exertion (RPE) scale Increase Physical Activity;Understanding of Exercise Prescription;Increase  Strength and Stamina;Knowledge and understanding of Target Heart Rate Range (THRR);Able to understand and use rate of perceived exertion (RPE) scale     Comments Pt first day in the [facility] ICR program. Pt tolerated exercise well with an average MET level of 2.37. Pt is learning her THRR, RPE and ExRx. Reviewed MET's, goals and home ExRx. Pt tolerated exercise well with an average MET level of 2.45. Pt is feeling good about her goals and is getting into a good exercise routine. She is walking on the days she does not come to cardiac rehab. She is also adding in some handweights and stretches. She feels good about her exercise plan and is gaining strength and stamina. she is meeting ACSM recommendations for 30 mins 3-4 days Reviewed MET's and goals. Pt tolerated exercise well with an average MET level of 2.91. Pt is feeling good  and started jogging in intervals today. She did very well. She is increasing strength, stamina and is enjoying her new exercise routine     Expected Outcomes Will continue to monitor pt and progress workloads as tolerated without sign or symptom Will continue to monitor pt and progress workloads as tolerated without sign or symptom Will continue to monitor pt and progress workloads as tolerated without sign or symptom              Discharge Exercise Prescription (Final Exercise Prescription Changes):  Exercise Prescription Changes - 12/16/23 1645       Response to Exercise   Blood Pressure (Admit) 100/70    Blood Pressure (Exercise) --   No BP needed, over 10 exercise session   Blood Pressure (Exit) 102/60    Heart Rate (Admit) 85 bpm    Heart Rate (Exercise) 126 bpm    Heart Rate (Exit) 87 bpm    Rating of Perceived Exertion (Exercise) 11    Perceived Dyspnea (Exercise) 0  Symptoms none    Comments Reviewed MET's    Duration Progress to 30 minutes of  aerobic without signs/symptoms of physical distress    Intensity THRR unchanged      Progression    Progression Continue to progress workloads to maintain intensity without signs/symptoms of physical distress.    Average METs 2.99      Resistance Training   Training Prescription Yes    Weight 2 lbs    Reps 10-15    Time 10 Minutes      Interval Training   Interval Training Yes    Equipment Treadmill    Comments 2 min walk 1 min jog      Treadmill   MPH 2.4   Jog 3.4 is 6.21 MET's   Grade 0    Minutes 15    METs 2.84      NuStep   Level 2    SPM 115    Minutes 15    METs 3      Home Exercise Plan   Plans to continue exercise at Home (comment)    Frequency Add 4 additional days to program exercise sessions.    Initial Home Exercises Provided 11/07/23             Nutrition:  Target Goals: Understanding of nutrition guidelines, daily intake of sodium 1500mg , cholesterol 200mg , calories 30% from fat and 7% or less from saturated fats, daily to have 5 or more servings of fruits and vegetables.  Biometrics:  Pre Biometrics - 10/11/23 1019       Pre Biometrics   Waist Circumference 40 inches    Hip Circumference 46.5 inches    Waist to Hip Ratio 0.86 %    Triceps Skinfold 35 mm    % Body Fat 45.2 %    Grip Strength 24 kg    Flexibility 15 in    Single Leg Stand 5.18 seconds              Nutrition Therapy Plan and Nutrition Goals:  Nutrition Therapy & Goals - 10/18/23 0951       Nutrition Therapy   Diet Heart Healthy/Carbohydrate Consistent diet    Drug/Food Interactions Statins/Certain Fruits      Personal Nutrition Goals   Nutrition Goal Patient to identify strategies for reducing cardiovascular risk by attending the [facility] education and nutrition series weekly.    Personal Goal #2 Patient to improve diet quality by using the plate method as a guide for meal planning to include lean protein/plant protein, fruits, vegetables, whole grains, nonfat dairy as part of a well-balanced diet.    Personal Goal #3 Patient to identify strategies for blood  sugar control with goal A1c <7%.    Comments Saarah has medical history of HTN, NSTEMI, DM1, s/pCABG x2, hyperlipidemia. LDL remains above goal; repatha  was approved on 10/17/23. Patient will benefit from participation in intensive cardiac rehab for nutrition, exercise, and lifestyle modification.      Intervention Plan   Intervention Prescribe, educate and counsel regarding individualized specific dietary modifications aiming towards targeted core components such as weight, hypertension, lipid management, diabetes, heart failure and other comorbidities.;Nutrition handout(s) given to patient.    Expected Outcomes Short Term Goal: Understand basic principles of dietary content, such as calories, fat, sodium, cholesterol and nutrients.;Long Term Goal: Adherence to prescribed nutrition plan.             Nutrition Assessments:  Nutrition Assessments - 10/18/23 0950  Rate Your Plate Scores   Pre Score 74            MEDIFICTS Score Key: >=70 Need to make dietary changes  40-70 Heart Healthy Diet <= 40 Therapeutic Level Cholesterol Diet   Flowsheet Row INTENSIVE CARDIAC REHAB from 10/17/2023 in [facility], [facility], & Lung Health  Picture Your Plate Total Score on Admission 74      Picture Your Plate Scores: <59 Unhealthy dietary pattern with much room for improvement. 41-50 Dietary pattern unlikely to meet recommendations for good health and room for improvement. 51-60 More healthful dietary pattern, with some room for improvement.  >60 Healthy dietary pattern, although there may be some specific behaviors that could be improved.    Nutrition Goals Re-Evaluation:  Nutrition Goals Re-Evaluation     Row Name 10/18/23 0951             Goals   Current Weight 201 lb 1 oz (91.2 kg)       Comment lipids WNL, LDL 77, lpa 182, A1c 7.0       Expected Outcome Czarina has medical history of HTN, NSTEMI, DM1, s/pCABG x2, hyperlipidemia. LDL  remains above goal; repatha  was approved on 10/17/23. Patient will benefit from participation in intensive cardiac rehab for nutrition, exercise, and lifestyle modification.                Nutrition Goals Re-Evaluation:  Nutrition Goals Re-Evaluation     Row Name 10/18/23 0951             Goals   Current Weight 201 lb 1 oz (91.2 kg)       Comment lipids WNL, LDL 77, lpa 182, A1c 7.0       Expected Outcome Harbour has medical history of HTN, NSTEMI, DM1, s/pCABG x2, hyperlipidemia. LDL remains above goal; repatha  was approved on 10/17/23. Patient will benefit from participation in intensive cardiac rehab for nutrition, exercise, and lifestyle modification.                Nutrition Goals Discharge (Final Nutrition Goals Re-Evaluation):  Nutrition Goals Re-Evaluation - 10/18/23 0951       Goals   Current Weight 201 lb 1 oz (91.2 kg)    Comment lipids WNL, LDL 77, lpa 182, A1c 7.0    Expected Outcome Pennelope has medical history of HTN, NSTEMI, DM1, s/pCABG x2, hyperlipidemia. LDL remains above goal; repatha  was approved on 10/17/23. Patient will benefit from participation in intensive cardiac rehab for nutrition, exercise, and lifestyle modification.             Psychosocial: Target Goals: Acknowledge presence or absence of significant depression and/or stress, maximize coping skills, provide positive support system. Participant is able to verbalize types and ability to use techniques and skills needed for reducing stress and depression.  Initial Review & Psychosocial Screening:  Initial Psych Review & Screening - 10/11/23 1113       Initial Review   Current issues with None Identified      Family Dynamics   Good Support System? Yes    Comments Ajanee has neighbors, a brother and sister, 2 daughers, 1 raised son, and sister-in-law music therapist) for support.      Barriers   Psychosocial barriers to participate in program There are no identifiable barriers or psychosocial  needs.      Screening Interventions   Interventions Encouraged to exercise;Provide feedback about the scores to participant    Expected Outcomes Short Term  goal: Identification and review with participant of any Quality of Life or Depression concerns found by scoring the questionnaire.;Long Term goal: The participant improves quality of Life and PHQ9 Scores as seen by post scores and/or verbalization of changes             Quality of Life Scores:  Quality of Life - 10/11/23 1320       Quality of Life   Select Quality of Life      Quality of Life Scores   Health/Function Pre 27.47 %    Socioeconomic Pre 27.5 %    Psych/Spiritual Pre 28.93 %    Family Pre 25.5 %    GLOBAL Pre 27.55 %            Scores of 19 and below usually indicate a poorer quality of life in these areas.  A difference of  2-3 points is a clinically meaningful difference.  A difference of 2-3 points in the total score of the Quality of Life Index has been associated with significant improvement in overall quality of life, self-image, physical symptoms, and general health in studies assessing change in quality of life.  PHQ-9: Review Flowsheet       10/11/2023 08/05/2016  Depression screen PHQ 2/9  Decreased Interest 0 0  Down, Depressed, Hopeless 0 0  PHQ - 2 Score 0 0  Altered sleeping 0 -  Tired, decreased energy 0 -  Change in appetite 0 -  Feeling bad or failure about yourself  0 -  Trouble concentrating 0 -  Moving slowly or fidgety/restless 0 -  Suicidal thoughts 0 -  PHQ-9 Score 0 -   Interpretation of Total Score  Total Score Depression Severity:  1-4 = Minimal depression, 5-9 = Mild depression, 10-14 = Moderate depression, 15-19 = Moderately severe depression, 20-27 = Severe depression   Psychosocial Evaluation and Intervention:   Psychosocial Re-Evaluation:  Psychosocial Re-Evaluation     Row Name 10/18/23 0827 11/01/23 1616 11/18/23 0837 12/20/23 1150       Psychosocial  Re-Evaluation   Current issues with None Identified None Identified None Identified None Identified    Comments -- -- -- Wen will commplete cardiac rehab on 01/04/24    Interventions Encouraged to attend Cardiac Rehabilitation for the exercise Encouraged to attend Cardiac Rehabilitation for the exercise Encouraged to attend Cardiac Rehabilitation for the exercise Encouraged to attend Cardiac Rehabilitation for the exercise    Continue Psychosocial Services  No Follow up required No Follow up required No Follow up required No Follow up required             Psychosocial Discharge (Final Psychosocial Re-Evaluation):  Psychosocial Re-Evaluation - 12/20/23 1150       Psychosocial Re-Evaluation   Current issues with None Identified    Comments Karna will commplete cardiac rehab on 01/04/24    Interventions Encouraged to attend Cardiac Rehabilitation for the exercise    Continue Psychosocial Services  No Follow up required             Vocational Rehabilitation: Provide vocational rehab assistance to qualifying candidates.   Vocational Rehab Evaluation & Intervention:  Vocational Rehab - 10/11/23 1115       Initial Vocational Rehab Evaluation & Intervention   Assessment shows need for Vocational Rehabilitation No   Retired. No VR needs.            Education: Education Goals: Education classes will be provided on a weekly basis, covering required topics. Participant will state  understanding/return demonstration of topics presented.    Education     Row Name 10/19/23 1600     Education   Cardiac Education Topics Pritikin   Customer Service Manager   Weekly Topic Powerhouse Plant-Based Proteins   Instruction Review Code 1- Verbalizes Understanding   Class Start Time 1400   Class Stop Time 1438   Class Time Calculation (min) 38 min    Row Name 10/21/23 1400     Education   Cardiac Education Topics Pritikin   Risk Analyst Education   General Education Hypertension and Heart Disease   Instruction Review Code 1- Verbalizes Understanding   Class Start Time 1357   Class Stop Time 1430   Class Time Calculation (min) 33 min    Row Name 10/24/23 1700     Education   Cardiac Education Topics Pritikin   Geographical Information Systems Officer Psychosocial   Psychosocial Workshop From Head to Heart: The Power of a Healthy Outlook   Instruction Review Code 1- Verbalizes Understanding   Class Start Time 1400   Class Stop Time 1450   Class Time Calculation (min) 50 min    Row Name 10/26/23 1500     Education   Cardiac Education Topics Pritikin   Customer Service Manager   Weekly Topic Adding Flavor - Sodium-Free   Instruction Review Code 1- Verbalizes Understanding   Class Start Time 1400   Class Stop Time 1440   Class Time Calculation (min) 40 min    Row Name 10/28/23 1500     Education   Cardiac Education Topics Pritikin   Hospital Doctor Education   General Education Heart Disease Risk Reduction   Instruction Review Code 1- Verbalizes Understanding   Class Start Time 1400   Class Stop Time 1433   Class Time Calculation (min) 33 min    Row Name 10/31/23 1600     Education   Cardiac Education Topics Pritikin   Geographical Information Systems Officer Exercise   Exercise Workshop Location Manager and Fall Prevention   Instruction Review Code 1- Verbalizes Understanding   Class Start Time 1406   Class Stop Time 1500   Class Time Calculation (min) 54 min    Row Name 11/02/23 1500     Education   Cardiac Education Topics Pritikin   Customer Service Manager   Weekly Topic Fast and Healthy  Breakfasts   Instruction Review Code 1- Verbalizes Understanding   Class Start Time 1355   Class Stop Time 1435   Class Time Calculation (min) 40 min    Row Name 11/07/23 1400     Education   Cardiac Education Topics Pritikin   Nurse, Children's Exercise Physiologist   Select Psychosocial   Psychosocial Healthy Minds, Bodies, Hearts   Instruction Review Code 1- Verbalizes Understanding   Class Start Time 1357   Class Stop Time 1429   Class Time Calculation (min) 32 min    Row Name 11/09/23 1400  Education   Cardiac Education Topics Pritikin   Writer Nutrition   Nutrition Becoming a Pritikin Chef   Instruction Review Code 1- Verbalizes Understanding   Class Start Time 1356    Row Name 11/14/23 1400     Education   Cardiac Education Topics Pritikin   Select Core Videos     Core Videos   Educator Exercise Physiologist   Select Nutrition   Nutrition Other  Label Reading   Instruction Review Code 1- Verbalizes Understanding   Class Start Time 1356   Class Stop Time 1430   Class Time Calculation (min) 34 min    Row Name 11/16/23 1400     Education   Cardiac Education Topics Pritikin   Customer Service Manager   Weekly Topic Tasty Appetizers and Snacks   Instruction Review Code 1- Verbalizes Understanding   Class Start Time 1355   Class Stop Time 1440   Class Time Calculation (min) 45 min    Row Name 11/18/23 1400     Education   Cardiac Education Topics Pritikin   Glass Blower/designer Nutrition   Nutrition Workshop Label Reading   Instruction Review Code 1- Verbalizes Understanding   Class Start Time 1401   Class Stop Time 1433   Class Time Calculation (min) 32 min    Row Name 11/21/23 1400     Education   Cardiac Education Topics Pritikin   Select Workshops     Workshops    Educator Exercise Physiologist   Select Psychosocial   Psychosocial Workshop Recognizing and Reducing Stress   Instruction Review Code 1- Verbalizes Understanding   Class Start Time 1359   Class Stop Time 1445   Class Time Calculation (min) 46 min    Row Name 11/23/23 1600     Education   Cardiac Education Topics Pritikin   Customer Service Manager   Weekly Topic Efficiency Cooking - Meals in a Snap   Instruction Review Code 1- Verbalizes Understanding   Class Start Time 1358   Class Stop Time 1438   Class Time Calculation (min) 40 min    Row Name 11/25/23 1500     Education   Cardiac Education Topics Pritikin   Nurse, Children's   Educator Dietitian   Select Nutrition   Nutrition Calorie Density   Instruction Review Code 1- Verbalizes Understanding   Class Start Time 1406   Class Stop Time 1501   Class Time Calculation (min) 55 min    Row Name 11/28/23 1500     Education   Cardiac Education Topics Pritikin   Geographical Information Systems Officer Exercise   Exercise Workshop Exercise Basics: Diplomatic Services Operational Officer   Instruction Review Code 1- Verbalizes Understanding   Class Start Time 1400   Class Stop Time 1450   Class Time Calculation (min) 50 min    Row Name 11/30/23 1500     Education   Cardiac Education Topics Pritikin   Customer Service Manager   Weekly Topic One-Pot Wonders   Instruction Review Code 1- Verbalizes Understanding   Class Start Time 1359  Class Stop Time 1436   Class Time Calculation (min) 37 min    Row Name 12/02/23 1400     Education   Cardiac Education Topics Pritikin   Psychologist, Forensic Exercise Education   Exercise Education Move It!   Instruction Review Code 1- Verbalizes Understanding   Class Start Time 1405   Class Stop Time  1440   Class Time Calculation (min) 35 min    Row Name 12/05/23 1400     Education   Cardiac Education Topics Pritikin   Hospital Doctor Education   General Education Hypertension and Heart Disease   Instruction Review Code 1- Verbalizes Understanding   Class Start Time 1400   Class Stop Time 1435   Class Time Calculation (min) 35 min    Row Name 12/16/23 1400     Education   Cardiac Education Topics Pritikin   Licensed Conveyancer Nutrition   Nutrition Dining Out - Part 1   Instruction Review Code 1- Verbalizes Understanding   Class Start Time 1358   Class Stop Time 1442   Class Time Calculation (min) 44 min    Row Name 12/19/23 1400     Education   Cardiac Education Topics Pritikin   Psychologist, Forensic Exercise Education   Exercise Education Biomechanial Limitations   Instruction Review Code 1- Verbalizes Understanding   Class Start Time 1400   Class Stop Time 1440   Class Time Calculation (min) 40 min            Core Videos: Exercise    Move It!  Clinical staff conducted group or individual video education with verbal and written material and guidebook.  Patient learns the recommended Pritikin exercise program. Exercise with the goal of living a long, healthy life. Some of the health benefits of exercise include controlled diabetes, healthier blood pressure levels, improved cholesterol levels, improved heart and lung capacity, improved sleep, and better body composition. Everyone should speak with their doctor before starting or changing an exercise routine.  Biomechanical Limitations Clinical staff conducted group or individual video education with verbal and written material and guidebook.  Patient learns how biomechanical limitations can impact exercise and how we can mitigate and  possibly overcome limitations to have an impactful and balanced exercise routine.  Body Composition Clinical staff conducted group or individual video education with verbal and written material and guidebook.  Patient learns that body composition (ratio of muscle mass to fat mass) is a key component to assessing overall fitness, rather than body weight alone. Increased fat mass, especially visceral belly fat, can put us  at increased risk for metabolic syndrome, type 2 diabetes, heart disease, and even death. It is recommended to combine diet and exercise (cardiovascular and resistance training) to improve your body composition. Seek guidance from your physician and exercise physiologist before implementing an exercise routine.  Exercise Action Plan Clinical staff conducted group or individual video education with verbal and written material and guidebook.  Patient learns the recommended strategies to achieve and enjoy long-term exercise adherence, including variety, self-motivation, self-efficacy, and positive decision making. Benefits of exercise include fitness, good health, weight management, more energy, better sleep, less stress, and  overall well-being.  Medical   Heart Disease Risk Reduction Clinical staff conducted group or individual video education with verbal and written material and guidebook.  Patient learns our heart is our most vital organ as it circulates oxygen, nutrients, white blood cells, and hormones throughout the entire body, and carries waste away. Data supports a plant-based eating plan like the Pritikin Program for its effectiveness in slowing progression of and reversing heart disease. The video provides a number of recommendations to address heart disease.   Metabolic Syndrome and Belly Fat  Clinical staff conducted group or individual video education with verbal and written material and guidebook.  Patient learns what metabolic syndrome is, how it leads to heart disease,  and how one can reverse it and keep it from coming back. You have metabolic syndrome if you have 3 of the following 5 criteria: abdominal obesity, high blood pressure, high triglycerides, low HDL cholesterol, and high blood sugar.  Hypertension and Heart Disease Clinical staff conducted group or individual video education with verbal and written material and guidebook.  Patient learns that high blood pressure, or hypertension, is very common in the [place] . Hypertension is largely due to excessive salt intake, but other important risk factors include being overweight, physical inactivity, drinking too much alcohol, smoking, and not eating enough potassium from fruits and vegetables. High blood pressure is a leading risk factor for heart attack, stroke, congestive heart failure, dementia, kidney failure, and premature death. Long-term effects of excessive salt intake include stiffening of the arteries and thickening of heart muscle and organ damage. Recommendations include ways to reduce hypertension and the risk of heart disease.  Diseases of Our Time - Focusing on Diabetes Clinical staff conducted group or individual video education with verbal and written material and guidebook.  Patient learns why the best way to stop diseases of our time is prevention, through food and other lifestyle changes. Medicine (such as prescription pills and surgeries) is often only a Band-Aid on the problem, not a long-term solution. Most common diseases of our time include obesity, type 2 diabetes, hypertension, heart disease, and cancer. The Pritikin Program is recommended and has been proven to help reduce, reverse, and/or prevent the damaging effects of metabolic syndrome.  Nutrition   Overview of the Pritikin Eating Plan  Clinical staff conducted group or individual video education with verbal and written material and guidebook.  Patient learns about the Pritikin Eating Plan for disease risk reduction. The  Pritikin Eating Plan emphasizes a wide variety of unrefined, minimally-processed carbohydrates, like fruits, vegetables, whole grains, and legumes. Go, Caution, and Stop food choices are explained. Plant-based and lean animal proteins are emphasized. Rationale provided for low sodium intake for blood pressure control, low added sugars for blood sugar stabilization, and low added fats and oils for coronary artery disease risk reduction and weight management.  Calorie Density  Clinical staff conducted group or individual video education with verbal and written material and guidebook.  Patient learns about calorie density and how it impacts the Pritikin Eating Plan. Knowing the characteristics of the food you choose will help you decide whether those foods will lead to weight gain or weight loss, and whether you want to consume more or less of them. Weight loss is usually a side effect of the Pritikin Eating Plan because of its focus on low calorie-dense foods.  Label Reading  Clinical staff conducted group or individual video education with verbal and written material and guidebook.  Patient learns about the Pritikin recommended  label reading guidelines and corresponding recommendations regarding calorie density, added sugars, sodium content, and whole grains.  Dining Out - Part 1  Clinical staff conducted group or individual video education with verbal and written material and guidebook.  Patient learns that restaurant meals can be sabotaging because they can be so high in calories, fat, sodium, and/or sugar. Patient learns recommended strategies on how to positively address this and avoid unhealthy pitfalls.  Facts on Fats  Clinical staff conducted group or individual video education with verbal and written material and guidebook.  Patient learns that lifestyle modifications can be just as effective, if not more so, as many medications for lowering your risk of heart disease. A Pritikin lifestyle can  help to reduce your risk of inflammation and atherosclerosis (cholesterol build-up, or plaque, in the artery walls). Lifestyle interventions such as dietary choices and physical activity address the cause of atherosclerosis. A review of the types of fats and their impact on blood cholesterol levels, along with dietary recommendations to reduce fat intake is also included.  Nutrition Action Plan  Clinical staff conducted group or individual video education with verbal and written material and guidebook.  Patient learns how to incorporate Pritikin recommendations into their lifestyle. Recommendations include planning and keeping personal health goals in mind as an important part of their success.  Healthy Mind-Set    Healthy Minds, Bodies, Hearts  Clinical staff conducted group or individual video education with verbal and written material and guidebook.  Patient learns how to identify when they are stressed. Video will discuss the impact of that stress, as well as the many benefits of stress management. Patient will also be introduced to stress management techniques. The way we think, act, and feel has an impact on our hearts.  How Our Thoughts Can Heal Our Hearts  Clinical staff conducted group or individual video education with verbal and written material and guidebook.  Patient learns that negative thoughts can cause depression and anxiety. This can result in negative lifestyle behavior and serious health problems. Cognitive behavioral therapy is an effective method to help control our thoughts in order to change and improve our emotional outlook.  Additional Videos:  Exercise    Improving Performance  Clinical staff conducted group or individual video education with verbal and written material and guidebook.  Patient learns to use a non-linear approach by alternating intensity levels and lengths of time spent exercising to help burn more calories and lose more body fat. Cardiovascular exercise  helps improve heart health, metabolism, hormonal balance, blood sugar control, and recovery from fatigue. Resistance training improves strength, endurance, balance, coordination, reaction time, metabolism, and muscle mass. Flexibility exercise improves circulation, posture, and balance. Seek guidance from your physician and exercise physiologist before implementing an exercise routine and learn your capabilities and proper form for all exercise.  Introduction to Yoga  Clinical staff conducted group or individual video education with verbal and written material and guidebook.  Patient learns about yoga, a discipline of the coming together of mind, breath, and body. The benefits of yoga include improved flexibility, improved range of motion, better posture and core strength, increased lung function, weight loss, and positive self-image. Yoga's heart health benefits include lowered blood pressure, healthier heart rate, decreased cholesterol and triglyceride levels, improved immune function, and reduced stress. Seek guidance from your physician and exercise physiologist before implementing an exercise routine and learn your capabilities and proper form for all exercise.  Medical   Aging: Enhancing Your Quality of Life  Clinical  staff conducted group or individual video education with verbal and written material and guidebook.  Patient learns key strategies and recommendations to stay in good physical health and enhance quality of life, such as prevention strategies, having an advocate, securing a Health Care Proxy and Power of Attorney, and keeping a list of medications and system for tracking them. It also discusses how to avoid risk for bone loss.  Biology of Weight Control  Clinical staff conducted group or individual video education with verbal and written material and guidebook.  Patient learns that weight gain occurs because we consume more calories than we burn (eating more, moving less). Even if  your body weight is normal, you may have higher ratios of fat compared to muscle mass. Too much body fat puts you at increased risk for cardiovascular disease, heart attack, stroke, type 2 diabetes, and obesity-related cancers. In addition to exercise, following the Pritikin Eating Plan can help reduce your risk.  Decoding Lab Results  Clinical staff conducted group or individual video education with verbal and written material and guidebook.  Patient learns that lab test reflects one measurement whose values change over time and are influenced by many factors, including medication, stress, sleep, exercise, food, hydration, pre-existing medical conditions, and more. It is recommended to use the knowledge from this video to become more involved with your lab results and evaluate your numbers to speak with your doctor.   Diseases of Our Time - Overview  Clinical staff conducted group or individual video education with verbal and written material and guidebook.  Patient learns that according to the CDC, 50% to 70% of chronic diseases (such as obesity, type 2 diabetes, elevated lipids, hypertension, and heart disease) are avoidable through lifestyle improvements including healthier food choices, listening to satiety cues, and increased physical activity.  Sleep Disorders Clinical staff conducted group or individual video education with verbal and written material and guidebook.  Patient learns how good quality and duration of sleep are important to overall health and well-being. Patient also learns about sleep disorders and how they impact health along with recommendations to address them, including discussing with a physician.  Nutrition  Dining Out - Part 2 Clinical staff conducted group or individual video education with verbal and written material and guidebook.  Patient learns how to plan ahead and communicate in order to maximize their dining experience in a healthy and nutritious manner.  Included are recommended food choices based on the type of restaurant the patient is visiting.   Fueling a Banker conducted group or individual video education with verbal and written material and guidebook.  There is a strong connection between our food choices and our health. Diseases like obesity and type 2 diabetes are very prevalent and are in large-part due to lifestyle choices. The Pritikin Eating Plan provides plenty of food and hunger-curbing satisfaction. It is easy to follow, affordable, and helps reduce health risks.  Menu Workshop  Clinical staff conducted group or individual video education with verbal and written material and guidebook.  Patient learns that restaurant meals can sabotage health goals because they are often packed with calories, fat, sodium, and sugar. Recommendations include strategies to plan ahead and to communicate with the manager, chef, or server to help order a healthier meal.  Planning Your Eating Strategy  Clinical staff conducted group or individual video education with verbal and written material and guidebook.  Patient learns about the Pritikin Eating Plan and its benefit of reducing the risk of  disease. The Pritikin Eating Plan does not focus on calories. Instead, it emphasizes high-quality, nutrient-rich foods. By knowing the characteristics of the foods, we choose, we can determine their calorie density and make informed decisions.  Targeting Your Nutrition Priorities  Clinical staff conducted group or individual video education with verbal and written material and guidebook.  Patient learns that lifestyle habits have a tremendous impact on disease risk and progression. This video provides eating and physical activity recommendations based on your personal health goals, such as reducing LDL cholesterol, losing weight, preventing or controlling type 2 diabetes, and reducing high blood pressure.  Vitamins and Minerals  Clinical staff  conducted group or individual video education with verbal and written material and guidebook.  Patient learns different ways to obtain key vitamins and minerals, including through a recommended healthy diet. It is important to discuss all supplements you take with your doctor.   Healthy Mind-Set    Smoking Cessation  Clinical staff conducted group or individual video education with verbal and written material and guidebook.  Patient learns that cigarette smoking and tobacco addiction pose a serious health risk which affects millions of people. Stopping smoking will significantly reduce the risk of heart disease, lung disease, and many forms of cancer. Recommended strategies for quitting are covered, including working with your doctor to develop a successful plan.  Culinary   Becoming a Set Designer conducted group or individual video education with verbal and written material and guidebook.  Patient learns that cooking at home can be healthy, cost-effective, quick, and puts them in control. Keys to cooking healthy recipes will include looking at your recipe, assessing your equipment needs, planning ahead, making it simple, choosing cost-effective seasonal ingredients, and limiting the use of added fats, salts, and sugars.  Cooking - Breakfast and Snacks  Clinical staff conducted group or individual video education with verbal and written material and guidebook.  Patient learns how important breakfast is to satiety and nutrition through the entire day. Recommendations include key foods to eat during breakfast to help stabilize blood sugar levels and to prevent overeating at meals later in the day. Planning ahead is also a key component.  Cooking - Educational Psychologist conducted group or individual video education with verbal and written material and guidebook.  Patient learns eating strategies to improve overall health, including an approach to cook more at home.  Recommendations include thinking of animal protein as a side on your plate rather than center stage and focusing instead on lower calorie dense options like vegetables, fruits, whole grains, and plant-based proteins, such as beans. Making sauces in large quantities to freeze for later and leaving the skin on your vegetables are also recommended to maximize your experience.  Cooking - Healthy Salads and Dressing Clinical staff conducted group or individual video education with verbal and written material and guidebook.  Patient learns that vegetables, fruits, whole grains, and legumes are the foundations of the Pritikin Eating Plan. Recommendations include how to incorporate each of these in flavorful and healthy salads, and how to create homemade salad dressings. Proper handling of ingredients is also covered. Cooking - Soups and [employer] - Soups and Desserts Clinical staff conducted group or individual video education with verbal and written material and guidebook.  Patient learns that Pritikin soups and desserts make for easy, nutritious, and delicious snacks and meal components that are low in sodium, fat, sugar, and calorie density, while high in vitamins, minerals, and filling fiber.  Recommendations include simple and healthy ideas for soups and desserts.   Overview     The Pritikin Solution Program Overview Clinical staff conducted group or individual video education with verbal and written material and guidebook.  Patient learns that the results of the Pritikin Program have been documented in more than 100 articles published in peer-reviewed journals, and the benefits include reducing risk factors for (and, in some cases, even reversing) high cholesterol, high blood pressure, type 2 diabetes, obesity, and more! An overview of the three key pillars of the Pritikin Program will be covered: eating well, doing regular exercise, and having a healthy mind-set.  WORKSHOPS   Exercise: Exercise Basics: Building Your Action Plan Clinical staff led group instruction and group discussion with PowerPoint presentation and patient guidebook. To enhance the learning environment the use of posters, models and videos may be added. At the conclusion of this workshop, patients will comprehend the difference between physical activity and exercise, as well as the benefits of incorporating both, into their routine. Patients will understand the FITT (Frequency, Intensity, Time, and Type) principle and how to use it to build an exercise action plan. In addition, safety concerns and other considerations for exercise and cardiac rehab will be addressed by the presenter. The purpose of this lesson is to promote a comprehensive and effective weekly exercise routine in order to improve patients' overall level of fitness.   Managing Heart Disease: Your Path to a Healthier Heart Clinical staff led group instruction and group discussion with PowerPoint presentation and patient guidebook. To enhance the learning environment the use of posters, models and videos may be added.At the conclusion of this workshop, patients will understand the anatomy and physiology of the heart. Additionally, they will understand how Pritikin's three pillars impact the risk factors, the progression, and the management of heart disease.  The purpose of this lesson is to provide a high-level overview of the heart, heart disease, and how the Pritikin lifestyle positively impacts risk factors.  Exercise Biomechanics Clinical staff led group instruction and group discussion with PowerPoint presentation and patient guidebook. To enhance the learning environment the use of posters, models and videos may be added. Patients will learn how the structural parts of their bodies function and how these functions impact their daily activities, movement, and exercise. Patients will learn how to promote a neutral spine, learn how  to manage pain, and identify ways to improve their physical movement in order to promote healthy living. The purpose of this lesson is to expose patients to common physical limitations that impact physical activity. Participants will learn practical ways to adapt and manage aches and pains, and to minimize their effect on regular exercise. Patients will learn how to maintain good posture while sitting, walking, and lifting.  Balance Training and Fall Prevention  Clinical staff led group instruction and group discussion with PowerPoint presentation and patient guidebook. To enhance the learning environment the use of posters, models and videos may be added. At the conclusion of this workshop, patients will understand the importance of their sensorimotor skills (vision, proprioception, and the vestibular system) in maintaining their ability to balance as they age. Patients will apply a variety of balancing exercises that are appropriate for their current level of function. Patients will understand the common causes for poor balance, possible solutions to these problems, and ways to modify their physical environment in order to minimize their fall risk. The purpose of this lesson is to teach patients about the importance of maintaining balance as  they age and ways to minimize their risk of falling.  WORKSHOPS   Nutrition:  Fueling a Ship Broker led group instruction and group discussion with PowerPoint presentation and patient guidebook. To enhance the learning environment the use of posters, models and videos may be added. Patients will review the foundational principles of the Pritikin Eating Plan and understand what constitutes a serving size in each of the food groups. Patients will also learn Pritikin-friendly foods that are better choices when away from home and review make-ahead meal and snack options. Calorie density will be reviewed and applied to three nutrition priorities:  weight maintenance, weight loss, and weight gain. The purpose of this lesson is to reinforce (in a group setting) the key concepts around what patients are recommended to eat and how to apply these guidelines when away from home by planning and selecting Pritikin-friendly options. Patients will understand how calorie density may be adjusted for different weight management goals.  Mindful Eating  Clinical staff led group instruction and group discussion with PowerPoint presentation and patient guidebook. To enhance the learning environment the use of posters, models and videos may be added. Patients will briefly review the concepts of the Pritikin Eating Plan and the importance of low-calorie dense foods. The concept of mindful eating will be introduced as well as the importance of paying attention to internal hunger signals. Triggers for non-hunger eating and techniques for dealing with triggers will be explored. The purpose of this lesson is to provide patients with the opportunity to review the basic principles of the Pritikin Eating Plan, discuss the value of eating mindfully and how to measure internal cues of hunger and fullness using the Hunger Scale. Patients will also discuss reasons for non-hunger eating and learn strategies to use for controlling emotional eating.  Targeting Your Nutrition Priorities Clinical staff led group instruction and group discussion with PowerPoint presentation and patient guidebook. To enhance the learning environment the use of posters, models and videos may be added. Patients will learn how to determine their genetic susceptibility to disease by reviewing their family history. Patients will gain insight into the importance of diet as part of an overall healthy lifestyle in mitigating the impact of genetics and other environmental insults. The purpose of this lesson is to provide patients with the opportunity to assess their personal nutrition priorities by looking at  their family history, their own health history and current risk factors. Patients will also be able to discuss ways of prioritizing and modifying the Pritikin Eating Plan for their highest risk areas  Menu  Clinical staff led group instruction and group discussion with PowerPoint presentation and patient guidebook. To enhance the learning environment the use of posters, models and videos may be added. Using menus brought in from [employer], or printed from [employer], patients will apply the Pritikin dining out guidelines that were presented in the [employer] video. Patients will also be able to practice these guidelines in a variety of provided scenarios. The purpose of this lesson is to provide patients with the opportunity to practice hands-on learning of the Pritikin Dining Out guidelines with actual menus and practice scenarios.  Label Reading Clinical staff led group instruction and group discussion with PowerPoint presentation and patient guidebook. To enhance the learning environment the use of posters, models and videos may be added. Patients will review and discuss the Pritikin label reading guidelines presented in Pritikin's Label Reading Educational series video. Using fool labels brought in from local  grocery stores and markets, patients will apply the label reading guidelines and determine if the packaged food meet the Pritikin guidelines. The purpose of this lesson is to provide patients with the opportunity to review, discuss, and practice hands-on learning of the Pritikin Label Reading guidelines with actual packaged food labels. Cooking School  Pritikin's [employer] are designed to teach patients ways to prepare quick, simple, and affordable recipes at home. The importance of nutrition's role in chronic disease risk reduction is reflected in its emphasis in the overall Pritikin program. By learning how to prepare essential core Pritikin Eating  Plan recipes, patients will increase control over what they eat; be able to customize the flavor of foods without the use of added salt, sugar, or fat; and improve the quality of the food they consume. By learning a set of core recipes which are easily assembled, quickly prepared, and affordable, patients are more likely to prepare more healthy foods at home. These workshops focus on convenient breakfasts, simple entres, side dishes, and desserts which can be prepared with minimal effort and are consistent with nutrition recommendations for cardiovascular risk reduction. Cooking [employer] are taught by a armed forces logistics/support/administrative officer (RD) who has been trained by the [employer]. The chef or RD has a clear understanding of the importance of minimizing - if not completely eliminating - added fat, sugar, and sodium in recipes. Throughout the series of Cooking School Workshop sessions, patients will learn about healthy ingredients and efficient methods of cooking to build confidence in their capability to prepare    Cooking School weekly topics:  Adding Flavor- Sodium-Free  Fast and Healthy Breakfasts  Powerhouse Plant-Based Proteins  Satisfying Salads and Dressings  Simple Sides and Sauces  International Cuisine-Spotlight on the [employer] Zones  Delicious Desserts  Savory Soups  [employer] - Meals in a Astronomer Appetizers and Snacks  Comforting Weekend Breakfasts  One-Pot Wonders   Fast Evening Meals  Landscape Architect Your Pritikin Plate  WORKSHOPS   Healthy Mindset (Psychosocial):  Focused Goals, Sustainable Changes Clinical staff led group instruction and group discussion with PowerPoint presentation and patient guidebook. To enhance the learning environment the use of posters, models and videos may be added. Patients will be able to apply effective goal setting strategies to establish at least one personal goal, and then take consistent, meaningful  action toward that goal. They will learn to identify common barriers to achieving personal goals and develop strategies to overcome them. Patients will also gain an understanding of how our mind-set can impact our ability to achieve goals and the importance of cultivating a positive and growth-oriented mind-set. The purpose of this lesson is to provide patients with a deeper understanding of how to set and achieve personal goals, as well as the tools and strategies needed to overcome common obstacles which may arise along the way.  From Head to Heart: The Power of a Healthy Outlook  Clinical staff led group instruction and group discussion with PowerPoint presentation and patient guidebook. To enhance the learning environment the use of posters, models and videos may be added. Patients will be able to recognize and describe the impact of emotions and mood on physical health. They will discover the importance of self-care and explore self-care practices which may work for them. Patients will also learn how to utilize the 4 C's to cultivate a healthier outlook and better manage stress and challenges. The purpose of this lesson is to demonstrate to  patients how a healthy outlook is an essential part of maintaining good health, especially as they continue their cardiac rehab journey.  Healthy Sleep for a Healthy Heart Clinical staff led group instruction and group discussion with PowerPoint presentation and patient guidebook. To enhance the learning environment the use of posters, models and videos may be added. At the conclusion of this workshop, patients will be able to demonstrate knowledge of the importance of sleep to overall health, well-being, and quality of life. They will understand the symptoms of, and treatments for, common sleep disorders. Patients will also be able to identify daytime and nighttime behaviors which impact sleep, and they will be able to apply these tools to help manage sleep-related  challenges. The purpose of this lesson is to provide patients with a general overview of sleep and outline the importance of quality sleep. Patients will learn about a few of the most common sleep disorders. Patients will also be introduced to the concept of "sleep hygiene," and discover ways to self-manage certain sleeping problems through simple daily behavior changes. Finally, the workshop will motivate patients by clarifying the links between quality sleep and their goals of heart-healthy living.   Recognizing and Reducing Stress Clinical staff led group instruction and group discussion with PowerPoint presentation and patient guidebook. To enhance the learning environment the use of posters, models and videos may be added. At the conclusion of this workshop, patients will be able to understand the types of stress reactions, differentiate between acute and chronic stress, and recognize the impact that chronic stress has on their health. They will also be able to apply different coping mechanisms, such as reframing negative self-talk. Patients will have the opportunity to practice a variety of stress management techniques, such as deep abdominal breathing, progressive muscle relaxation, and/or guided imagery.  The purpose of this lesson is to educate patients on the role of stress in their lives and to provide healthy techniques for coping with it.  Learning Barriers/Preferences:  Learning Barriers/Preferences - 10/11/23 1114       Learning Barriers/Preferences   Learning Barriers None    Learning Preferences Written Material;Pictoral;Group Instruction;Computer/Internet;Individual Instruction;Video;Skilled Demonstration             Education Topics:  Knowledge Questionnaire Score:  Knowledge Questionnaire Score - 10/11/23 1319       Knowledge Questionnaire Score   Pre Score 22/24             Core Components/Risk Factors/Patient Goals at Admission:  Personal Goals and Risk  Factors at Admission - 10/11/23 1114       Core Components/Risk Factors/Patient Goals on Admission    Weight Management Yes;Obesity;Weight Loss    Intervention Weight Management/Obesity: Establish reasonable short term and long term weight goals.;Obesity: Provide education and appropriate resources to help participant work on and attain dietary goals.    Admit Weight 200 lb 13.4 oz (91.1 kg)    Expected Outcomes Short Term: Continue to assess and modify interventions until short term weight is achieved;Long Term: Adherence to nutrition and physical activity/exercise program aimed toward attainment of established weight goal;Weight Loss: Understanding of general recommendations for a balanced deficit meal plan, which promotes 1-2 lb weight loss per week and includes a negative energy balance of [phone] kcal/d    Diabetes Yes    Intervention Provide education about signs/symptoms and action to take for hypo/hyperglycemia.;Provide education about proper nutrition, including hydration, and aerobic/resistive exercise prescription along with prescribed medications to achieve blood glucose in normal ranges: Fasting glucose  65-99 mg/dL    Expected Outcomes Short Term: Participant verbalizes understanding of the signs/symptoms and immediate care of hyper/hypoglycemia, proper foot care and importance of medication, aerobic/resistive exercise and nutrition plan for blood glucose control.;Long Term: Attainment of HbA1C < 7%.    Hypertension Yes    Intervention Provide education on lifestyle modifcations including regular physical activity/exercise, weight management, moderate sodium restriction and increased consumption of fresh fruit, vegetables, and low fat dairy, alcohol moderation, and smoking cessation.;Monitor prescription use compliance.    Expected Outcomes Short Term: Continued assessment and intervention until BP is < 140/66mm HG in hypertensive participants. < 130/40mm HG in hypertensive participants  with diabetes, heart failure or chronic kidney disease.;Long Term: Maintenance of blood pressure at goal levels.    Lipids Yes    Intervention Provide education and support for participant on nutrition & aerobic/resistive exercise along with prescribed medications to achieve LDL 70mg , HDL >40mg .    Expected Outcomes Short Term: Participant states understanding of desired cholesterol values and is compliant with medications prescribed. Participant is following exercise prescription and nutrition guidelines.;Long Term: Cholesterol controlled with medications as prescribed, with individualized exercise RX and with personalized nutrition plan. Value goals: LDL < 70mg , HDL > 40 mg.    Personal Goal Other Yes    Personal Goal Walk more often each day. Develop a daily exercise routine. Learn better cooking habits. Make better food choices.    Intervention Provide individualized exercise plan that Paden can follow at home. She will attend cooking school to learn heart healthy meals that she can make at home.    Expected Outcomes Twylah will comply with individualized exercise plan including walking at home. She will follow individualized nutrition action plan.             Core Components/Risk Factors/Patient Goals Review:   Goals and Risk Factor Review     Row Name 10/18/23 0828 11/01/23 1617 11/18/23 0839 12/20/23 1153       Core Components/Risk Factors/Patient Goals Review   Personal Goals Review Weight Management/Obesity;Hypertension;Lipids;Diabetes Weight Management/Obesity;Hypertension;Lipids;Diabetes Weight Management/Obesity;Hypertension;Lipids;Diabetes Weight Management/Obesity;Hypertension;Lipids;Diabetes    Review Anderia started cardiac rehab on 10/17/23. Amisha did well with exercise.Jaelah was hypoglycemic post exercise and was treated with juice and glucose gel. Cerena started cardiac rehab on 10/17/23. Nejla is off to a good start  to  exercise. Vital signs have been stable.  CBG's have been variable. Keonna has been adjusting her basal insulin  via her insulin  pump. Colbie is doing well with exercise at  cardiac rehab on 10/17/23. Vital signs  and CBG's have  been stable. Mahlani has lost 1.3 kg since starting cardiac rehab/ Mariselda continues to do  well with exercise at  cardiac rehab . Shanigua is now doing high intensity on the treadmill. Vital signs  and CBG's have  been stable. Systolic BP's noted in the mid to upper 90's on 12/19/23. Asmptomatic. Will continue to monitor BP's.  Chantalle has lost 1.9 kg since starting cardiac rehab. Artelia will complete cardiac rehab on 01/04/24.    Expected Outcomes Jomaira will continue to participate in cardiac rehab for exercise, nutrition and lifestyle modifications. CBG's will be within noram parameters Sayward will continue to participate in cardiac rehab for exercise, nutrition and lifestyle modifications. CBG's will be within noram parameters Sylina will continue to participate in cardiac rehab for exercise, nutrition and lifestyle modifications. CBG's will be within normal  parameters Shandelle will continue to participate in cardiac rehab for exercise, nutrition and lifestyle modifications. CBG's will be within normal  parameters             Core Components/Risk Factors/Patient Goals at Discharge (Final Review):   Goals and Risk Factor Review - 12/20/23 1153       Core Components/Risk Factors/Patient Goals Review   Personal Goals Review Weight Management/Obesity;Hypertension;Lipids;Diabetes    Review Alora continues to do  well with exercise at  cardiac rehab . Samara is now doing high intensity on the treadmill. Vital signs  and CBG's have  been stable. Systolic BP's noted in the mid to upper 90's on 12/19/23. Asmptomatic. Will continue to monitor BP's.  Krista has lost 1.9 kg since starting cardiac rehab. Alyia will complete cardiac rehab on 01/04/24.    Expected Outcomes Fatumata will continue to participate in  cardiac rehab for exercise, nutrition and lifestyle modifications. CBG's will be within normal  parameters             ITP Comments:  ITP Comments     Row Name 10/11/23 1019 10/18/23 0823 11/01/23 1616 11/18/23 0836 12/20/23 1149   ITP Comments Medical Director- Dr. Wilbert Bihari, MD. Introduction to the Pritikin Education Program / Intensive Cardiac Rehab. Reviewed intital orientation folder with participant. 30 Day ITP Review. Tifani started cardiac rehab on 10/17/23. Koda did well with exercise. Angelita was hypoglycemic post exercise and was treated with juice and glucose gel. 30 Day ITP Review. Tequilla started cardiac rehab on 10/17/23. Haden is off to a good start to exercise at cardiac rehab. 30 Day ITP Review. Breeanne has good attendance and participation during exercise at cardiac rehab.  cardiac rehab 30 Day ITP Review. Icela continues to have  good attendance and participation during exercise at cardiac rehab.  Junice will complete cardiac rehab on 01/04/24            Comments: See ITP comments.Hadassah Elpidio Quan RN BSN

## 2023-12-21 ENCOUNTER — Encounter (HOSPITAL_COMMUNITY)
Admission: RE | Admit: 2023-12-21 | Discharge: 2023-12-21 | Disposition: A | Discharge status: Home / Self Care | Payer: Medicare Other | Source: Ambulatory Visit | Attending: Cardiovascular Disease | Admitting: Cardiovascular Disease

## 2023-12-21 DIAGNOSIS — Z48812 Encounter for surgical aftercare following surgery on the circulatory system: Secondary | ICD-10-CM | POA: Diagnosis not present

## 2023-12-21 DIAGNOSIS — Z951 Presence of aortocoronary bypass graft: Secondary | ICD-10-CM | POA: Diagnosis not present

## 2023-12-21 DIAGNOSIS — I214 Non-ST elevation (NSTEMI) myocardial infarction: Secondary | ICD-10-CM

## 2023-12-21 DIAGNOSIS — I252 Old myocardial infarction: Secondary | ICD-10-CM | POA: Diagnosis not present

## 2023-12-21 DIAGNOSIS — E162 Hypoglycemia, unspecified: Secondary | ICD-10-CM | POA: Diagnosis not present

## 2023-12-23 ENCOUNTER — Encounter (HOSPITAL_COMMUNITY): Payer: Medicare Other

## 2023-12-23 ENCOUNTER — Telehealth (HOSPITAL_COMMUNITY): Payer: Self-pay

## 2023-12-23 NOTE — Telephone Encounter (Signed)
 Called pt to inform of CRP2 closure due to inclement weather. Pt voiced understanding.  Jonna Coup, MS, ACSM-CEP 12/23/2023 8:28 AM

## 2023-12-24 NOTE — Progress Notes (Deleted)
 Cardiology Office Note    Date:  12/24/2023  ID:  Kerry Compton, DOB 05-01-1956, MRN [id number] PCP:  Teresa Channel, MD  Cardiologist:  Darryle ONEIDA Decent, MD  Electrophysiologist:  None   Chief Complaint: ***  History of Present Illness: .    Kerry Compton is a 68 y.o. female with visit-pertinent history of CAD s/p CABG x 2 (LIMA to the LAD and SVG to OM), hyperlipidemia, hypertension and type 1 diabetes mellitus.  On 08/25/2023 she presented to [facility] ER with chest pain concerning for angina and a mildly elevated troponin.  She underwent cardiac catheterization on 09/07/2023 and was found to have proximal LAD stenosis of 55% followed by 100% occlusion of the mid LAD, high-grade LCx stenosis of 90% and mild 30% stenosis in the RCA.  She did have collaterals from right to left.  Her LVEF was estimated at 35 to 40% at the time of heart catheterization and LVEDP was elevated.  Her follow-up echocardiogram showed a low normal LVEF of 50 to 55%, grade 1 DD, trivial MR and no aortic stenosis.  She ultimately underwent CABG x 2 with LIMA to the LAD and SVG to OM on 09/09/2023 with Dr. Lucas.  Her postoperative course was uneventful, she was discharged on 09/14/2023.  She was seen in follow-up on 10/03/2023, she remained stable from a cardiac perspective.  She is seen by Pharm.D. on 10/06/2023 and was started on Repatha .  Today she presents for follow-up.  She reports that she  CAD: s/p CABG x2 with LIMA to LAD and SVG to OM on 09/09/23. Today she denies anginal symptoms, she has been increasing her activity, tolerating well. Her surgical incisions are clean, dry and intact, no evidence of infection.  Continue 81 mg aspirin , Repatha , Plavix  75 mg daily, lipitor  80 mg daily,  and Lopressor  25 mg twice daily.    Hyperlipidemia: Last lipid profile on 09/07/2023 indicated total cholesterol 137, triglycerides 50, HDL 50, LDL 77, LP (a) 182. She has been taking atorvastatin  80 mg daily, even prior to  admission, given CABG and diabetes history her LDL goal is less than 55. Previoulsy referred to pharmD lipid clinic and started on Repatha  Check fasting lipid profile and LFTs.    Chronic diastolic dysfunction: Felt to initially have acute HFpEF felt secondary to MI, at time of cath LVEDP was 20 mmHg.  Follow-up echocardiogram showed a low normal LVEF of 50 to 55%.  Today she appears euvolemic and well compensated on exam.  Continue GDMT consisting of Valsartan 320 daily, Lopressor  25 mg daily and 2.5 mg amlodipine .   Type 1 diabetes mellitus: Last hemoglobin A1c 7.0%.  Monitored and managed per PCP.    Labwork independently reviewed:   ROS: .    Please see the history of present illness. Otherwise, review of systems is positive for ***.  All other systems are reviewed and otherwise negative.  Studies Reviewed: SABRA    EKG:  EKG is ordered today, personally reviewed, demonstrating ***  CV Studies: Cardiac studies reviewed are outlined and summarized above. Otherwise please see EMR for full report.   Current Reported Medications:.    No outpatient medications have been marked as taking for the 12/27/23 encounter (Appointment) with Docie Abramovich D, NP.    Physical Exam:    VS:  There were no vitals taken for this visit.   Wt Readings from Last 3 Encounters:  10/11/23 200 lb 13.4 oz (91.1 kg)  10/05/23 200 lb (90.7 kg)  10/03/23 200 lb (90.7 kg)    GEN: Well nourished, well developed in no acute distress NECK: No JVD; No carotid bruits CARDIAC: ***RRR, no murmurs, rubs, gallops RESPIRATORY:  Clear to auscultation without rales, wheezing or rhonchi  ABDOMEN: Soft, non-tender, non-distended EXTREMITIES:  No edema; No acute deformity   Asessement and Plan:.     ***     Disposition: F/u with ***  Signed, Zyair Rhein D Kaelen Brennan, NP

## 2023-12-26 ENCOUNTER — Encounter (HOSPITAL_COMMUNITY): Payer: Medicare Other

## 2023-12-27 ENCOUNTER — Ambulatory Visit: Payer: Medicare Other | Admitting: Cardiology

## 2023-12-28 ENCOUNTER — Encounter (HOSPITAL_COMMUNITY): Payer: Medicare Other

## 2023-12-30 ENCOUNTER — Encounter (HOSPITAL_COMMUNITY): Payer: Medicare Other

## 2024-01-02 ENCOUNTER — Encounter (HOSPITAL_COMMUNITY)
Admission: RE | Admit: 2024-01-02 | Discharge: 2024-01-02 | Disposition: A | Discharge status: Home / Self Care | Payer: Medicare Other | Source: Ambulatory Visit | Attending: Cardiovascular Disease | Admitting: Cardiovascular Disease

## 2024-01-02 DIAGNOSIS — I214 Non-ST elevation (NSTEMI) myocardial infarction: Secondary | ICD-10-CM

## 2024-01-02 DIAGNOSIS — Z951 Presence of aortocoronary bypass graft: Secondary | ICD-10-CM

## 2024-01-02 DIAGNOSIS — I252 Old myocardial infarction: Secondary | ICD-10-CM | POA: Diagnosis not present

## 2024-01-02 DIAGNOSIS — E162 Hypoglycemia, unspecified: Secondary | ICD-10-CM | POA: Diagnosis not present

## 2024-01-02 DIAGNOSIS — Z48812 Encounter for surgical aftercare following surgery on the circulatory system: Secondary | ICD-10-CM | POA: Diagnosis not present

## 2024-01-04 ENCOUNTER — Encounter (HOSPITAL_COMMUNITY)
Admission: RE | Admit: 2024-01-04 | Discharge: 2024-01-04 | Disposition: A | Discharge status: Home / Self Care | Payer: Medicare Other | Source: Ambulatory Visit | Attending: Cardiovascular Disease | Admitting: Cardiovascular Disease

## 2024-01-04 DIAGNOSIS — Z951 Presence of aortocoronary bypass graft: Secondary | ICD-10-CM

## 2024-01-04 DIAGNOSIS — I214 Non-ST elevation (NSTEMI) myocardial infarction: Secondary | ICD-10-CM

## 2024-01-04 DIAGNOSIS — E162 Hypoglycemia, unspecified: Secondary | ICD-10-CM | POA: Diagnosis not present

## 2024-01-04 DIAGNOSIS — Z48812 Encounter for surgical aftercare following surgery on the circulatory system: Secondary | ICD-10-CM | POA: Diagnosis not present

## 2024-01-04 DIAGNOSIS — I252 Old myocardial infarction: Secondary | ICD-10-CM | POA: Diagnosis not present

## 2024-01-06 ENCOUNTER — Encounter (HOSPITAL_COMMUNITY)
Admission: RE | Admit: 2024-01-06 | Discharge: 2024-01-06 | Disposition: A | Discharge status: Home / Self Care | Payer: Medicare Other | Source: Ambulatory Visit | Attending: Cardiovascular Disease

## 2024-01-06 DIAGNOSIS — I214 Non-ST elevation (NSTEMI) myocardial infarction: Secondary | ICD-10-CM

## 2024-01-06 DIAGNOSIS — Z951 Presence of aortocoronary bypass graft: Secondary | ICD-10-CM

## 2024-01-06 DIAGNOSIS — I252 Old myocardial infarction: Secondary | ICD-10-CM | POA: Diagnosis not present

## 2024-01-06 DIAGNOSIS — E162 Hypoglycemia, unspecified: Secondary | ICD-10-CM

## 2024-01-06 DIAGNOSIS — Z48812 Encounter for surgical aftercare following surgery on the circulatory system: Secondary | ICD-10-CM | POA: Diagnosis not present

## 2024-01-06 LAB — GLUCOSE, CAPILLARY
Glucose-Capillary: 61 mg/dL — ABNORMAL LOW (ref 70–99)
Glucose-Capillary: 91 mg/dL (ref 70–99)

## 2024-01-06 NOTE — Progress Notes (Signed)
Patient reported feeling lightheaded. CBG 180 pre exercise. Rosey Bath reported feeling lightheaded. CBG 67 by her CGM. CBG check with hospital meter=61. Patient was given glucose gel apple juice and graham crackers. Will notify onsite provider Neila Gear NP. Will also notify patient's endocrinologist Dr Sharl Ma. Lakeyia reported feeling better. Repeat CBG 91. Lillan left cardiac rehab without symptoms or complaints. Patient said that she had a baked potato brussels sprouts and broccoli. Reminded Chalsey to eat a protein and a carbohydrate before coming to exercise. Patient states understanding.Thayer Headings RN BSN

## 2024-01-09 ENCOUNTER — Encounter (HOSPITAL_COMMUNITY)
Admission: RE | Admit: 2024-01-09 | Discharge: 2024-01-09 | Disposition: A | Discharge status: Home / Self Care | Payer: Medicare Other | Source: Ambulatory Visit | Attending: Cardiovascular Disease | Admitting: Cardiovascular Disease

## 2024-01-09 DIAGNOSIS — I252 Old myocardial infarction: Secondary | ICD-10-CM | POA: Diagnosis not present

## 2024-01-09 DIAGNOSIS — Z951 Presence of aortocoronary bypass graft: Secondary | ICD-10-CM

## 2024-01-09 DIAGNOSIS — I214 Non-ST elevation (NSTEMI) myocardial infarction: Secondary | ICD-10-CM

## 2024-01-09 DIAGNOSIS — Z48812 Encounter for surgical aftercare following surgery on the circulatory system: Secondary | ICD-10-CM | POA: Diagnosis not present

## 2024-01-09 DIAGNOSIS — E162 Hypoglycemia, unspecified: Secondary | ICD-10-CM | POA: Diagnosis not present

## 2024-01-11 ENCOUNTER — Encounter (HOSPITAL_COMMUNITY)
Admission: RE | Admit: 2024-01-11 | Discharge: 2024-01-11 | Disposition: A | Discharge status: Home / Self Care | Payer: Medicare Other | Source: Ambulatory Visit | Attending: Cardiovascular Disease

## 2024-01-11 DIAGNOSIS — Z951 Presence of aortocoronary bypass graft: Secondary | ICD-10-CM | POA: Diagnosis not present

## 2024-01-11 DIAGNOSIS — E162 Hypoglycemia, unspecified: Secondary | ICD-10-CM | POA: Diagnosis not present

## 2024-01-11 DIAGNOSIS — I252 Old myocardial infarction: Secondary | ICD-10-CM | POA: Diagnosis not present

## 2024-01-11 DIAGNOSIS — I214 Non-ST elevation (NSTEMI) myocardial infarction: Secondary | ICD-10-CM

## 2024-01-11 DIAGNOSIS — Z48812 Encounter for surgical aftercare following surgery on the circulatory system: Secondary | ICD-10-CM | POA: Diagnosis not present

## 2024-01-12 NOTE — Progress Notes (Signed)
Cardiac Individual Treatment Plan  Patient Details  Name: Kerry Compton MRN: [id number] Date of Birth: 07/26/1956 Referring Provider:   Flowsheet Row INTENSIVE CARDIAC REHAB ORIENT from 10/11/2023 in [facility]  Referring Provider O'Neal, Ronnald Ramp, MD       Initial Encounter Date:  Flowsheet Row INTENSIVE CARDIAC REHAB ORIENT from 10/11/2023 in [facility] for Heart, Vascular, & Lung Health  Date 10/11/23       Visit Diagnosis: 09/06/23 NSTEMI  09/09/23 CABG x 2  Patient's Home Medications on Admission:  Current Outpatient Medications:    acetaminophen (TYLENOL) 500 MG tablet, Take 1-2 tablets (500-1,000 mg total) by mouth every 6 (six) hours as needed., Disp: , Rfl:    amLODipine (NORVASC) 2.5 MG tablet, Take 2.5 mg by mouth daily., Disp: , Rfl:    aspirin EC 81 MG tablet, Take 1 tablet (81 mg total) by mouth daily., Disp: , Rfl:    atorvastatin (LIPITOR) 80 MG tablet, Take 80 mg by mouth daily.  , Disp: , Rfl:    beclomethasone (QVAR REDIHALER) 40 MCG/ACT inhaler, Inhale 1 puff into the lungs daily., Disp: , Rfl:    Cholecalciferol (VITAMIN D3) 50 MCG (2000 UT) capsule, Take 2,000 Units by mouth daily., Disp: , Rfl:    clopidogrel (PLAVIX) 75 MG tablet, Take 1 tablet (75 mg total) by mouth daily., Disp: 90 tablet, Rfl: 1   Continuous Glucose Sensor (GUARDIAN 4 GLUCOSE SENSOR) MISC, Place 1 Application onto the skin See admin instructions. Every 6 days, Disp: , Rfl:    Evolocumab (REPATHA SURECLICK) 140 MG/ML SOAJ, Inject 140 mg into the skin every 14 (fourteen) days. (Patient not taking: Reported on 10/11/2023), Disp: 6 mL, Rfl: 1   fexofenadine (ALLEGRA) 180 MG tablet, Take 180 mg by mouth daily., Disp: , Rfl:    fluticasone (FLONASE ALLERGY RELIEF) 50 MCG/ACT nasal spray, Place 1 spray into both nostrils daily., Disp: , Rfl:    HUMALOG 100 UNIT/ML injection, Inject 77 Units into the  skin once., Disp: , Rfl:    Insulin Human (INSULIN PUMP) 100 unit/ml SOLN, Inject into the skin continuous. Humalog insulin pump basal rate 1200 am to 300 am = 1.0 600 am to 600 pm = 0.90, Disp: , Rfl:    Insulin Infusion Pump Supplies (MINIMED MIO ADVANCE INFUSE SET) MISC, 1 Application by Intra-arterial Infusion route See admin instructions. Every three days, Disp: , Rfl:    metoprolol tartrate (LOPRESSOR) 25 MG tablet, Take 1 tablet (25 mg total) by mouth 2 (two) times daily., Disp: 180 tablet, Rfl: 1   Multiple Vitamin (MULTIVITAMIN WITH MINERALS) TABS tablet, Take 1 tablet by mouth daily., Disp: , Rfl:    omeprazole (PRILOSEC) 20 MG capsule, 1 capsule 30 minutes before morning meal, Disp: , Rfl:    oxybutynin (DITROPAN-XL) 10 MG 24 hr tablet, Take 10 mg by mouth daily., Disp: , Rfl:    oxyCODONE (OXY IR/ROXICODONE) 5 MG immediate release tablet, Take 1 tablet (5 mg total) by mouth every 4 (four) hours as needed for severe pain. 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, no current liver problems  Hyperlipemia    Hypertension     Tobacco Use: Social History   Tobacco Use  Smoking Status Never  Smokeless Tobacco Never    Labs: Review Flowsheet  More data exists      Latest Ref Rng & Units 02/09/2015 08/27/2015 09/07/2023 09/08/2023 09/09/2023  Labs for ITP Cardiac and Pulmonary Rehab  Cholestrol 0 - 200 mg/dL - - 161  - -  LDL (calc) 0 - 99 mg/dL - - 77  - -  HDL-C >09 mg/dL - - 50  - -  Trlycerides <150 mg/dL - - 50  - -  Hemoglobin A1c 4.8 - 5.6 % 7.6  - 7.0  - -  PH, Arterial 7.35 - 7.45 - - - 7.444  7.304  7.319  7.327  7.451   PCO2 arterial 32 - 48 mmHg - - - 37.1  43.0  44.8  44.8  34.6   Bicarbonate 20.0 - 28.0 mmol/L -  18.9  - 25.5  21.4  23.2  23.6  23.9  24.1   TCO2 22 - 32 mmol/L - 20  - 27  23  25  25  24  26  25  25  23  28    Acid-base deficit 0.0 - 2.0 mmol/L - 8.0  - - 5.0  3.0  3.0   O2 Saturation % - 43.0  - 94  96  94  94  80  100     Details       Multiple values from one day are sorted in reverse-chronological order         Capillary Blood Glucose: Lab Results  Component Value Date   GLUCAP 91 01/06/2024   GLUCAP 61 (L) 01/06/2024   GLUCAP 238 (H) 10/31/2023   GLUCAP 104 (H) 10/28/2023   GLUCAP 230 (H) 10/19/2023     Exercise Target Goals: Exercise Program Goal: Individual exercise prescription set using results from initial 6 min walk test and THRR while considering  patient's activity barriers and safety.   Exercise Prescription Goal: Initial exercise prescription builds to 30-45 minutes a day of aerobic activity, 2-3 days per week.  Home exercise guidelines will be given to patient during program as part of exercise prescription that the participant will acknowledge.  Activity Barriers & Risk Stratification:  Activity Barriers & Cardiac Risk Stratification - 10/11/23 1038       Activity Barriers & Cardiac Risk Stratification   Activity Barriers None    Cardiac Risk Stratification High             6 Minute Walk:  6 Minute Walk     Row Name 10/11/23 1155         6 Minute Walk   Phase Initial     Distance 1374 feet     Walk Time 6 minutes     # of Rest Breaks 0     MPH 2.6     METS 2.83     RPE 9     Perceived Dyspnea  0     VO2 Peak 9.9     Symptoms No     Resting HR 75 bpm     Resting BP 110/62     Resting Oxygen Saturation  96 %     Exercise Oxygen Saturation  during 6 min walk 98 %     Max Ex. HR 94 bpm     Max Ex. BP 138/62     2 Minute Post BP 128/70  Oxygen Initial Assessment:   Oxygen Re-Evaluation:   Oxygen Discharge (Final Oxygen Re-Evaluation):   Initial Exercise Prescription:  Initial Exercise Prescription -  11/07/23 1639       Treadmill   MPH --   2.0 speed, no incline   METs --   2.53 METs at 2/0            Perform Capillary Blood Glucose checks as needed.  Exercise Prescription Changes:   Exercise Prescription Changes     Row Name 10/17/23 1651 11/07/23 1640 11/28/23 1702 12/16/23 1645 01/04/24 1628     Response to Exercise   Blood Pressure (Admit) 112/72 98/60 104/68 100/70 110/58   Blood Pressure (Exercise) 122/62 148/72 118/82 --  No BP needed, over 10 exercise session --  No BP needed, over 10 exercise session   Blood Pressure (Exit) 122/62 102/60 108/60 102/60 102/60   Heart Rate (Admit) 89 bpm 80 bpm 83 bpm 85 bpm 72 bpm   Heart Rate (Exercise) 108 bpm 109 bpm 116 bpm 126 bpm 114 bpm   Heart Rate (Exit) 95 bpm 89 bpm 91 bpm 87 bpm 90 bpm   Rating of Perceived Exertion (Exercise) 11 11 11.5 11 11.5   Perceived Dyspnea (Exercise) 0 0 0 0 0   Symptoms none none none none none   Comments Pt first day in the [facility] I[facility] program Reviewedd MET's, goals and home ExRx Reviewed MET's and goals Reviewed MET's Reviewed MET's and goals   Duration Progress to 30 minutes of  aerobic without signs/symptoms of physical distress Progress to 30 minutes of  aerobic without signs/symptoms of physical distress Progress to 30 minutes of  aerobic without signs/symptoms of physical distress Progress to 30 minutes of  aerobic without signs/symptoms of physical distress Progress to 30 minutes of  aerobic without signs/symptoms of physical distress   Intensity THRR unchanged THRR unchanged THRR unchanged THRR unchanged THRR unchanged     Progression   Progression Continue to progress workloads to maintain intensity without signs/symptoms of physical distress. Continue to progress workloads to maintain intensity without signs/symptoms of physical distress. Continue to progress workloads to maintain intensity without signs/symptoms of physical distress. Continue to progress workloads to maintain  intensity without signs/symptoms of physical distress. Continue to progress workloads to maintain intensity without signs/symptoms of physical distress.   Average METs 2.37 2.45 2.91 2.99 4.08     Resistance Training   Training Prescription Yes Yes Yes Yes Yes   Weight 2 lbs 2 lbs 2 lbs 2 lbs 2 lbs   Reps 10-15 10-15 10-15 10-15 10-15   Time 10 Minutes 10 Minutes 10 Minutes 10 Minutes 10 Minutes     Interval Training   Interval Training -- -- Yes Yes Yes   Equipment -- -- Treadmill Treadmill Treadmill   Comments -- -- 2 min walk 1 min jog 2 min walk 1 min jog 2 min walk 1 min jog     Treadmill   MPH 2.2  2.0 speed, no incline 2.2 2.2  Jog 3.3 is 3.53 MET's 2.4  Jog 3.4 is 6.21 MET's 2.4  Jog 3.4 is 6.21 MET's   Grade 0 0 0 0 0   Minutes 15 15 15 15 15    METs 2.68  2.53 METs at 2/0 2.68 2.68 2.84 2.84     NuStep   Level 1 2 2 2 2    SPM 85 106 107 115 117   Minutes 15 15 15 15 15    METs 2.5 2.2  2.5 3 3.2     Home Exercise Plan   Plans to continue exercise at -- Home (comment) Home (comment) Home (comment) Home (comment)   Frequency -- Add 4 additional days to program exercise sessions. Add 4 additional days to program exercise sessions. Add 4 additional days to program exercise sessions. Add 4 additional days to program exercise sessions.   Initial Home Exercises Provided -- 11/07/23 11/07/23 11/07/23 11/07/23            Exercise Comments:   Exercise Comments     Row Name 10/17/23 1655 11/07/23 1652 11/28/23 1707 12/16/23 1648 01/02/24 1645   Exercise Comments Pt first day in the [facility] program. Pt tolerated exercise well with an average MET level of 2.37. Pt is learning her THRR, RPE and ExRx. Will continue to monitor pt and progress workloads as tolerated without sign or symptom Reviewed MET's, goals and home ExRx. Pt tolerated exercise well with an average MET level of 2.45. Pt is feeling good about her goals and is getting into a good exercise routine. She is walking  on the days she does not come to cardiac rehab. She is also adding in some handweights and stretches. She feels good about her exercise plan and is gaining strength and stamina. she is meeting ACSM recommendations for 30 mins 3-4 days Reviewed MET's and goals. Pt tolerated exercise well with an average MET level of 2.91. Pt is feeling good and started jogging in intervals today. She did very well. She is increasing strength, stamina and is enjoying her new exercise routine Reviewed MET's. Pt tolerated exercise well with an average MET level of 2.99. Pt is doing very well and progressing MET's. She says she is feeling a lot better and is increasing strength and stamina Patient first day back since 1/8 due to some household issues. She is feeling good getting back into exercise. Will review education soon    Row Name 01/04/24 1633           Exercise Comments Reviewed MET's and goals. Pt tolerated exercise well with an average MET level of 4.09. Pt is doing well and is increasing METs. She is feeling good with her exercise routine                Exercise Goals and Review:   Exercise Goals     Row Name 10/11/23 1038             Exercise Goals   Increase Physical Activity Yes       Intervention Provide advice, education, support and counseling about physical activity/exercise needs.;Develop an individualized exercise prescription for aerobic and resistive training based on initial evaluation findings, risk stratification, comorbidities and participant's personal goals.       Expected Outcomes Short Term: Attend rehab on a regular basis to increase amount of physical activity.;Long Term: Add in home exercise to make exercise part of routine and to increase amount of physical activity.;Long Term: Exercising regularly at least 3-5 days a week.       Increase Strength and Stamina Yes       Intervention Provide advice, education, support and counseling about physical activity/exercise  needs.;Develop an individualized exercise prescription for aerobic and resistive training based on initial evaluation findings, risk stratification, comorbidities and participant's personal goals.       Expected Outcomes Short Term: Increase workloads from initial exercise prescription for resistance, speed, and METs.;Short Term: Perform resistance training exercises routinely during rehab and add in resistance training  at home;Long Term: Improve cardiorespiratory fitness, muscular endurance and strength as measured by increased METs and functional capacity ( )       Able to understand and use rate of perceived exertion (RPE) scale Yes       Intervention Provide education and explanation on how to use RPE scale       Expected Outcomes Short Term: Able to use RPE daily in rehab to express subjective intensity level;Long Term:  Able to use RPE to guide intensity level when exercising independently       Knowledge and understanding of Target Heart Rate Range (THRR) Yes       Intervention Provide education and explanation of THRR including how the numbers were predicted and where they are located for reference       Expected Outcomes Short Term: Able to state/look up THRR;Long Term: Able to use THRR to govern intensity when exercising independently;Short Term: Able to use daily as guideline for intensity in rehab       Able to check pulse independently Yes       Intervention Provide education and demonstration on how to check pulse in carotid and radial arteries.;Review the importance of being able to check your own pulse for safety during independent exercise       Expected Outcomes Short Term: Able to explain why pulse checking is important during independent exercise;Long Term: Able to check pulse independently and accurately       Understanding of Exercise Prescription Yes       Intervention Provide education, explanation, and written materials on patient's individual exercise prescription        Expected Outcomes Short Term: Able to explain program exercise prescription;Long Term: Able to explain home exercise prescription to exercise independently                Exercise Goals Re-Evaluation :  Exercise Goals Re-Evaluation     Row Name 10/17/23 1654 11/07/23 1643 11/28/23 1704 01/04/24 1630       Exercise Goal Re-Evaluation   Exercise Goals Review Increase Physical Activity;Understanding of Exercise Prescription;Increase Strength and Stamina;Knowledge and understanding of Target Heart Rate Range (THRR);Able to understand and use rate of perceived exertion (RPE) scale Increase Physical Activity;Understanding of Exercise Prescription;Increase Strength and Stamina;Knowledge and understanding of Target Heart Rate Range (THRR);Able to understand and use rate of perceived exertion (RPE) scale Increase Physical Activity;Understanding of Exercise Prescription;Increase Strength and Stamina;Knowledge and understanding of Target Heart Rate Range (THRR);Able to understand and use rate of perceived exertion (RPE) scale Increase Physical Activity;Understanding of Exercise Prescription;Increase Strength and Stamina;Knowledge and understanding of Target Heart Rate Range (THRR);Able to understand and use rate of perceived exertion (RPE) scale    Comments Pt first day in the [facility] program. Pt tolerated exercise well with an average MET level of 2.37. Pt is learning her THRR, RPE and ExRx. Reviewed MET's, goals and home ExRx. Pt tolerated exercise well with an average MET level of 2.45. Pt is feeling good about her goals and is getting into a good exercise routine. She is walking on the days she does not come to cardiac rehab. She is also adding in some handweights and stretches. She feels good about her exercise plan and is gaining strength and stamina. she is meeting ACSM recommendations for 30 mins 3-4 days Reviewed MET's and goals. Pt tolerated exercise well with an average MET level of 2.91. Pt is  feeling good  and started jogging in intervals today. She did very well. She  is increasing strength, stamina and is enjoying her new exercise routine Reviewed MET's and goals. Pt tolerated exercise well with an average MET level of 4.09. Pt is doing well and is increasing METs. She is feeling good with her exercise routine    Expected Outcomes Will continue to monitor pt and progress workloads as tolerated without sign or symptom Will continue to monitor pt and progress workloads as tolerated without sign or symptom Will continue to monitor pt and progress workloads as tolerated without sign or symptom Will continue to monitor pt and progress workloads as tolerated without sign or symptom             Discharge Exercise Prescription (Final Exercise Prescription Changes):  Exercise Prescription Changes - 01/04/24 1628       Response to Exercise   Blood Pressure (Admit) 110/58    Blood Pressure (Exercise) --   No BP needed, over 10 exercise session   Blood Pressure (Exit) 102/60    Heart Rate (Admit) 72 bpm    Heart Rate (Exercise) 114 bpm    Heart Rate (Exit) 90 bpm    Rating of Perceived Exertion (Exercise) 11.5    Perceived Dyspnea (Exercise) 0    Symptoms none    Comments Reviewed MET's and goals    Duration Progress to 30 minutes of  aerobic without signs/symptoms of physical distress    Intensity THRR unchanged      Progression   Progression Continue to progress workloads to maintain intensity without signs/symptoms of physical distress.    Average METs 4.08      Resistance Training   Training Prescription Yes    Weight 2 lbs    Reps 10-15    Time 10 Minutes      Interval Training   Interval Training Yes    Equipment Treadmill    Comments 2 min walk 1 min jog      Treadmill   MPH 2.4   Jog 3.4 is 6.21 MET's   Grade 0    Minutes 15    METs 2.84      NuStep   Level 2    SPM 117    Minutes 15    METs 3.2      Home Exercise Plan   Plans to continue exercise at  Home (comment)    Frequency Add 4 additional days to program exercise sessions.    Initial Home Exercises Provided 11/07/23             Nutrition:  Target Goals: Understanding of nutrition guidelines, daily intake of sodium 1500mg , cholesterol 200mg , calories 30% from fat and 7% or less from saturated fats, daily to have 5 or more servings of fruits and vegetables.  Biometrics:  Pre Biometrics - 10/11/23 1019       Pre Biometrics   Waist Circumference 40 inches    Hip Circumference 46.5 inches    Waist to Hip Ratio 0.86 %    Triceps Skinfold 35 mm    % Body Fat 45.2 %    Grip Strength 24 kg    Flexibility 15 in    Single Leg Stand 5.18 seconds              Nutrition Therapy Plan and Nutrition Goals:  Nutrition Therapy & Goals - 10/18/23 0951       Nutrition Therapy   Diet Heart Healthy/Carbohydrate Consistent diet    Drug/Food Interactions Statins/Certain Fruits      Personal Nutrition Goals  Nutrition Goal Patient to identify strategies for reducing cardiovascular risk by attending the [facility] education and nutrition series weekly.    Personal Goal #2 Patient to improve diet quality by using the plate method as a guide for meal planning to include lean protein/plant protein, fruits, vegetables, whole grains, nonfat dairy as part of a well-balanced diet.    Personal Goal #3 Patient to identify strategies for blood sugar control with goal A1c <7%.    Comments Kerry Compton has medical history of HTN, NSTEMI, DM1, s/pCABG x2, hyperlipidemia. LDL remains above goal; repatha was approved on 10/17/23. Patient will benefit from participation in intensive cardiac rehab for nutrition, exercise, and lifestyle modification.      Intervention Plan   Intervention Prescribe, educate and counsel regarding individualized specific dietary modifications aiming towards targeted core components such as weight, hypertension, lipid management, diabetes, heart failure and other  comorbidities.;Nutrition handout(s) given to patient.    Expected Outcomes Short Term Goal: Understand basic principles of dietary content, such as calories, fat, sodium, cholesterol and nutrients.;Long Term Goal: Adherence to prescribed nutrition plan.             Nutrition Assessments:  Nutrition Assessments - 10/18/23 0950       Rate Your Plate Scores   Pre Score 74            MEDIFICTS Score Key: >=70 Need to make dietary changes  40-70 Heart Healthy Diet <= 40 Therapeutic Level Cholesterol Diet   Flowsheet Row INTENSIVE CARDIAC REHAB from [facility]17/2023 in [facility], [facility] Lung [facility]  Picture Your Plate Total Score on Admission 74      Picture Your Plate Scores: <40 Unhealthy dietary pattern with much room for improvement. 41-50 Dietary pattern unlikely to meet recommendations for good health and room for improvement. 51-60 More healthful dietary pattern, with some room for improvement.  >60 Healthy dietary pattern, although there may be some specific behaviors that could be improved.    Nutrition Goals Re-Evaluation:  Nutrition Goals Re-Evaluation     Row Name 10/18/23 0951             Goals   Current Weight 201 lb 1 oz (91.2 kg)       Comment lipids WNL, LDL 77, lpa 182, A1c 7.0       Expected Outcome Kerry Compton has medical history of HTN, NSTEMI, DM1, s/pCABG x2, hyperlipidemia. LDL remains above goal; repatha was approved on 10/17/23. Patient will benefit from participation in intensive cardiac rehab for nutrition, exercise, and lifestyle modification.                Nutrition Goals Re-Evaluation:  Nutrition Goals Re-Evaluation     Row Name 10/18/23 0951             Goals   Current Weight 201 lb 1 oz (91.2 kg)       Comment lipids WNL, LDL 77, lpa 182, A1c 7.0       Expected Outcome Kerry Compton has medical history of HTN, NSTEMI, DM1, s/pCABG x2, hyperlipidemia. LDL remains above goal; repatha was approved on  10/17/23. Patient will benefit from participation in intensive cardiac rehab for nutrition, exercise, and lifestyle modification.                Nutrition Goals Discharge (Final Nutrition Goals Re-Evaluation):  Nutrition Goals Re-Evaluation - 10/18/23 0951       Goals   Current Weight 201 lb 1 oz (91.2 kg)    Comment  lipids WNL, LDL 77, lpa 182, A1c 7.0    Expected Outcome Kerry Compton has medical history of HTN, NSTEMI, DM1, s/pCABG x2, hyperlipidemia. LDL remains above goal; repatha was approved on 10/17/23. Patient will benefit from participation in intensive cardiac rehab for nutrition, exercise, and lifestyle modification.             Psychosocial: Target Goals: Acknowledge presence or absence of significant depression and/or stress, maximize coping skills, provide positive support system. Participant is able to verbalize types and ability to use techniques and skills needed for reducing stress and depression.  Initial Review & Psychosocial Screening:  Initial Psych Review & Screening - 10/11/23 1113       Initial Review   Current issues with None Identified      Family Dynamics   Good Support System? Yes    Comments Kerry Compton has neighbors, a brother and sister, 2 daughers, 1 raised son, and sister-in-law Music therapist) for support.      Barriers   Psychosocial barriers to participate in program There are no identifiable barriers or psychosocial needs.      Screening Interventions   Interventions Encouraged to exercise;Provide feedback about the scores to participant    Expected Outcomes Short Term goal: Identification and review with participant of any Quality of Life or Depression concerns found by scoring the questionnaire.;Long Term goal: The participant improves quality of Life and PHQ9 Scores as seen by post scores and/or verbalization of changes             Quality of Life Scores:  Quality of Life - 10/11/23 1320       Quality of Life   Select Quality of Life       Quality of Life Scores   Health/Function Pre 27.47 %    Socioeconomic Pre 27.5 %    Psych/Spiritual Pre 28.93 %    Family Pre 25.5 %    GLOBAL Pre 27.55 %            Scores of 19 and below usually indicate a poorer quality of life in these areas.  A difference of  2-3 points is a clinically meaningful difference.  A difference of 2-3 points in the total score of the Quality of Life Index has been associated with significant improvement in overall quality of life, self-image, physical symptoms, and general health in studies assessing change in quality of life.  PHQ-9: Review Flowsheet       10/11/2023 08/05/2016  Depression screen PHQ 2/9  Decreased Interest 0 0  Down, Depressed, Hopeless 0 0  PHQ - 2 Score 0 0  Altered sleeping 0 -  Tired, decreased energy 0 -  Change in appetite 0 -  Feeling bad or failure about yourself  0 -  Trouble concentrating 0 -  Moving slowly or fidgety/restless 0 -  Suicidal thoughts 0 -  PHQ-9 Score 0 -   Interpretation of Total Score  Total Score Depression Severity:  1-4 = Minimal depression, 5-9 = Mild depression, 10-14 = Moderate depression, 15-19 = Moderately severe depression, 20-27 = Severe depression   Psychosocial Evaluation and Intervention:   Psychosocial Re-Evaluation:  Psychosocial Re-Evaluation     Row Name 10/18/23 0827 11/01/23 1616 11/18/23 0837 12/20/23 1150 01/12/24 0931     Psychosocial Re-Evaluation   Current issues with None Identified None Identified None Identified None Identified None Identified   Comments -- -- -- Kerry Compton will commplete cardiac rehab on 01/04/24 Kerry Compton will commplete cardiac rehab on 01/20/24   Interventions  Encouraged to attend Cardiac Rehabilitation for the exercise Encouraged to attend Cardiac Rehabilitation for the exercise Encouraged to attend Cardiac Rehabilitation for the exercise Encouraged to attend Cardiac Rehabilitation for the exercise Encouraged to attend Cardiac Rehabilitation for  the exercise   Continue Psychosocial Services  No Follow up required No Follow up required No Follow up required No Follow up required No Follow up required            Psychosocial Discharge (Final Psychosocial Re-Evaluation):  Psychosocial Re-Evaluation - 01/12/24 0931       Psychosocial Re-Evaluation   Current issues with None Identified    Comments Kerry Compton will commplete cardiac rehab on 01/20/24    Interventions Encouraged to attend Cardiac Rehabilitation for the exercise    Continue Psychosocial Services  No Follow up required             Vocational Rehabilitation: Provide vocational rehab assistance to qualifying candidates.   Vocational Rehab Evaluation & Intervention:  Vocational Rehab - 10/11/23 1115       Initial Vocational Rehab Evaluation & Intervention   Assessment shows need for Vocational Rehabilitation No   Retired. No VR needs.            Education: Education Goals: Education classes will be provided on a weekly basis, covering required topics. Participant will state understanding/return demonstration of topics presented.    Education     Row Name 10/19/23 1600     Education   Cardiac Education Topics Pritikin   Customer service manager   Weekly Topic Powerhouse Plant-Based Proteins   Instruction Review Code 1- Verbalizes Understanding   Class Start Time 1400   Class Stop Time 1438   Class Time Calculation (min) 38 min    Row Name 10/21/23 1400     Education   Cardiac Education Topics Pritikin   Hospital doctor Education   General Education Hypertension and Heart Disease   Instruction Review Code 1- Verbalizes Understanding   Class Start Time 1357   Class Stop Time 1430   Class Time Calculation (min) 33 min    Row Name 10/24/23 1700     Education   Cardiac Education Topics Pritikin   Education officer, museum Psychosocial   Psychosocial Workshop From Head to Heart: The Power of a Healthy Outlook   Instruction Review Code 1- Verbalizes Understanding   Class Start Time 1400   Class Stop Time 1450   Class Time Calculation (min) 50 min    Row Name 10/26/23 1500     Education   Cardiac Education Topics Pritikin   Customer service manager   Weekly Topic Adding Flavor - Sodium-Free   Instruction Review Code 1- Verbalizes Understanding   Class Start Time 1400   Class Stop Time 1440   Class Time Calculation (min) 40 min    Row Name 10/28/23 1500     Education   Cardiac Education Topics Pritikin   Hospital doctor Education   General Education Heart Disease Risk Reduction   Instruction Review Code 1- Verbalizes Understanding   Class Start Time 1400   Class Stop Time 1433   Class Time  Calculation (min) 33 min    Row Name 10/31/23 1600     Education   Cardiac Education Topics Pritikin   Geographical information systems officer Exercise   Exercise Workshop Location manager and Fall Prevention   Instruction Review Code 1- Verbalizes Understanding   Class Start Time 1406   Class Stop Time 1500   Class Time Calculation (min) 54 min    Row Name 11/02/23 1500     Education   Cardiac Education Topics Pritikin   Customer service manager   Weekly Topic Fast and Healthy Breakfasts   Instruction Review Code 1- Verbalizes Understanding   Class Start Time 1355   Class Stop Time 1435   Class Time Calculation (min) 40 min    Row Name 11/07/23 1400     Education   Cardiac Education Topics Pritikin   Nurse, children's Exercise Physiologist   Select Psychosocial   Psychosocial Healthy Minds, Bodies, Hearts   Instruction Review Code 1-  Verbalizes Understanding   Class Start Time 1357   Class Stop Time 1429   Class Time Calculation (min) 32 min    Row Name 11/09/23 1400     Education   Cardiac Education Topics Pritikin   Nurse, children's Nurse   Select Nutrition   Nutrition Becoming a Pritikin Chef   Instruction Review Code 1- Verbalizes Understanding   Class Start Time 1356    Row Name 11/14/23 1400     Education   Cardiac Education Topics Pritikin   Select Core Videos     Core Videos   Educator Exercise Physiologist   Select Nutrition   Nutrition Other  Label Reading   Instruction Review Code 1- Verbalizes Understanding   Class Start Time 1356   Class Stop Time 1430   Class Time Calculation (min) 34 min    Row Name 11/16/23 1400     Education   Cardiac Education Topics Pritikin   Customer service manager   Weekly Topic Tasty Appetizers and Snacks   Instruction Review Code 1- Verbalizes Understanding   Class Start Time 1355   Class Stop Time 1440   Class Time Calculation (min) 45 min    Row Name 11/18/23 1400     Education   Cardiac Education Topics Pritikin   Glass blower/designer Nutrition   Nutrition Workshop Label Reading   Instruction Review Code 1- Verbalizes Understanding   Class Start Time 1401   Class Stop Time 1433   Class Time Calculation (min) 32 min    Row Name 11/21/23 1400     Education   Cardiac Education Topics Pritikin   Select Workshops     Workshops   Educator Exercise Physiologist   Select Psychosocial   Psychosocial Workshop Recognizing and Reducing Stress   Instruction Review Code 1- Verbalizes Understanding   Class Start Time 1359   Class Stop Time 1445   Class Time Calculation (min) 46 min    Row Name 11/23/23 1600     Education   Cardiac Education Topics Pritikin   Librarian, academic  Cooking - Meals in a Snap   Instruction Review Code 1- Verbalizes Understanding   Class Start Time 1358   Class Stop Time 1438   Class Time Calculation (min) 40 min    Row Name 11/25/23 1500     Education   Cardiac Education Topics Pritikin   Select Core Videos     Core Videos   Educator Dietitian   Select Nutrition   Nutrition Calorie Density   Instruction Review Code 1- Verbalizes Understanding   Class Start Time 1406   Class Stop Time 1501   Class Time Calculation (min) 55 min    Row Name 11/28/23 1500     Education   Cardiac Education Topics Pritikin   Geographical information systems officer Exercise   Exercise Workshop Exercise Basics: Diplomatic Services operational officer   Instruction Review Code 1- Verbalizes Understanding   Class Start Time 1400   Class Stop Time 1450   Class Time Calculation (min) 50 min    Row Name 11/30/23 1500     Education   Cardiac Education Topics Pritikin   Orthoptist   Educator Dietitian   Weekly Topic One-Pot Wonders   Instruction Review Code 1- Verbalizes Understanding   Class Start Time 1359   Class Stop Time 1436   Class Time Calculation (min) 37 min    Row Name 12/02/23 1400     Education   Cardiac Education Topics Pritikin   Psychologist, forensic Exercise Education   Exercise Education Move It!   Instruction Review Code 1- Verbalizes Understanding   Class Start Time 1405   Class Stop Time 1440   Class Time Calculation (min) 35 min    Row Name 12/05/23 1400     Education   Cardiac Education Topics Pritikin   Hospital doctor Education   General Education Hypertension and Heart Disease   Instruction Review Code 1- Verbalizes Understanding   Class Start Time 1400   Class Stop Time 1435   Class Time Calculation  (min) 35 min    Row Name 12/16/23 1400     Education   Cardiac Education Topics Pritikin   Licensed conveyancer Nutrition   Nutrition Dining Out - Part 1   Instruction Review Code 1- Verbalizes Understanding   Class Start Time 1358   Class Stop Time 1442   Class Time Calculation (min) 44 min    Row Name 12/19/23 1400     Education   Cardiac Education Topics Pritikin   Psychologist, forensic Exercise Education   Exercise Education Biomechanial Limitations   Instruction Review Code 1- Verbalizes Understanding   Class Start Time 1400   Class Stop Time 1440   Class Time Calculation (min) 40 min    Row Name 12/21/23 1500     Education   Cardiac Education Topics Pritikin   Customer service manager   Weekly Topic Comforting Weekend Breakfasts   Instruction Review Code 1- Verbalizes Understanding   Class Start Time 1400   Class Stop Time 1440  Class Time Calculation (min) 40 min    Row Name 01/02/24 1500     Education   Cardiac Education Topics Pritikin   Geographical information systems officer Psychosocial   Psychosocial Workshop Healthy Sleep for a Healthy Heart   Instruction Review Code 1- Verbalizes Understanding   Class Start Time 1400   Class Stop Time 1445   Class Time Calculation (min) 45 min    Row Name 01/04/24 1500     Education   Cardiac Education Topics Pritikin   Customer service manager   Weekly Topic International Cuisine- Spotlight on the [employer] Zones   Instruction Review Code 1- Verbalizes Understanding   Class Start Time 1400   Class Stop Time 1441   Class Time Calculation (min) 41 min    Row Name 01/06/24 1400     Education   Cardiac Education Topics Pritikin   Nurse, children's Exercise  Physiologist   Select Psychosocial   Psychosocial How Our Thoughts Can Heal Our Hearts   Instruction Review Code 1- Verbalizes Understanding   Class Start Time 1358   Class Stop Time 1435   Class Time Calculation (min) 37 min    Row Name 01/09/24 1500     Education   Cardiac Education Topics Pritikin   Select Workshops     Workshops   Educator Exercise Physiologist   Select Exercise   Exercise Workshop Managing Heart Disease: Your Path to a Healthier Heart   Instruction Review Code 1- Verbalizes Understanding   Class Start Time 1355   Class Stop Time 1445   Class Time Calculation (min) 50 min    Row Name 01/11/24 1000     Education   Cardiac Education Topics Pritikin   Secondary school teacher School   Educator Dietitian;Respiratory Therapist   Weekly Topic Simple Sides and Sauces   Instruction Review Code 1- Verbalizes Understanding   Class Start Time 0815   Class Stop Time 0847   Class Time Calculation (min) 32 min            Core Videos: Exercise    Move It!  Clinical staff conducted group or individual video education with verbal and written material and guidebook.  Patient learns the recommended Pritikin exercise program. Exercise with the goal of living a long, healthy life. Some of the health benefits of exercise include controlled diabetes, healthier blood pressure levels, improved cholesterol levels, improved heart and lung capacity, improved sleep, and better body composition. Everyone should speak with their doctor before starting or changing an exercise routine.  Biomechanical Limitations Clinical staff conducted group or individual video education with verbal and written material and guidebook.  Patient learns how biomechanical limitations can impact exercise and how we can mitigate and possibly overcome limitations to have an impactful and balanced exercise routine.  Body Composition Clinical staff conducted group or individual video  education with verbal and written material and guidebook.  Patient learns that body composition (ratio of muscle mass to fat mass) is a key component to assessing overall fitness, rather than body weight alone. Increased fat mass, especially visceral belly fat, can put [place] at increased risk for metabolic syndrome, type 2 diabetes, heart disease, and even death. It is recommended to combine diet and exercise (cardiovascular and resistance training) to improve your body composition.  Seek guidance from your physician and exercise physiologist before implementing an exercise routine.  Exercise Action Plan Clinical staff conducted group or individual video education with verbal and written material and guidebook.  Patient learns the recommended strategies to achieve and enjoy long-term exercise adherence, including variety, self-motivation, self-efficacy, and positive decision making. Benefits of exercise include fitness, good health, weight management, more energy, better sleep, less stress, and overall well-being.  Medical   Heart Disease Risk Reduction Clinical staff conducted group or individual video education with verbal and written material and guidebook.  Patient learns our heart is our most vital organ as it circulates oxygen, nutrients, white blood cells, and hormones throughout the entire body, and carries waste away. Data supports a plant-based eating plan like the Pritikin Program for its effectiveness in slowing progression of and reversing heart disease. The video provides a number of recommendations to address heart disease.   Metabolic Syndrome and Belly Fat  Clinical staff conducted group or individual video education with verbal and written material and guidebook.  Patient learns what metabolic syndrome is, how it leads to heart disease, and how one can reverse it and keep it from coming back. You have metabolic syndrome if you have 3 of the following 5 criteria: abdominal obesity, high  blood pressure, high triglycerides, low HDL cholesterol, and high blood sugar.  Hypertension and Heart Disease Clinical staff conducted group or individual video education with verbal and written material and guidebook.  Patient learns that high blood pressure, or hypertension, is very common in the [place]. Hypertension is largely due to excessive salt intake, but other important risk factors include being overweight, physical inactivity, drinking too much alcohol, smoking, and not eating enough potassium from fruits and vegetables. High blood pressure is a leading risk factor for heart attack, stroke, congestive heart failure, dementia, kidney failure, and premature death. Long-term effects of excessive salt intake include stiffening of the arteries and thickening of heart muscle and organ damage. Recommendations include ways to reduce hypertension and the risk of heart disease.  Diseases of Our Time - Focusing on Diabetes Clinical staff conducted group or individual video education with verbal and written material and guidebook.  Patient learns why the best way to stop diseases of our time is prevention, through food and other lifestyle changes. Medicine (such as prescription pills and surgeries) is often only a Band-Aid on the problem, not a long-term solution. Most common diseases of our time include obesity, type 2 diabetes, hypertension, heart disease, and cancer. The Pritikin Program is recommended and has been proven to help reduce, reverse, and/or prevent the damaging effects of metabolic syndrome.  Nutrition   Overview of the Pritikin Eating Plan  Clinical staff conducted group or individual video education with verbal and written material and guidebook.  Patient learns about the Pritikin Eating Plan for disease risk reduction. The Pritikin Eating Plan emphasizes a wide variety of unrefined, minimally-processed carbohydrates, like fruits, vegetables, whole grains, and legumes. Go,  Caution, and Stop food choices are explained. Plant-based and lean animal proteins are emphasized. Rationale provided for low sodium intake for blood pressure control, low added sugars for blood sugar stabilization, and low added fats and oils for coronary artery disease risk reduction and weight management.  Calorie Density  Clinical staff conducted group or individual video education with verbal and written material and guidebook.  Patient learns about calorie density and how it impacts the Pritikin Eating Plan. Knowing the characteristics of the food you choose will help you  decide whether those foods will lead to weight gain or weight loss, and whether you want to consume more or less of them. Weight loss is usually a side effect of the Pritikin Eating Plan because of its focus on low calorie-dense foods.  Label Reading  Clinical staff conducted group or individual video education with verbal and written material and guidebook.  Patient learns about the Pritikin recommended label reading guidelines and corresponding recommendations regarding calorie density, added sugars, sodium content, and whole grains.  Dining Out - Part 1  Clinical staff conducted group or individual video education with verbal and written material and guidebook.  Patient learns that restaurant meals can be sabotaging because they can be so high in calories, fat, sodium, and/or sugar. Patient learns recommended strategies on how to positively address this and avoid unhealthy pitfalls.  Facts on Fats  Clinical staff conducted group or individual video education with verbal and written material and guidebook.  Patient learns that lifestyle modifications can be just as effective, if not more so, as many medications for lowering your risk of heart disease. A Pritikin lifestyle can help to reduce your risk of inflammation and atherosclerosis (cholesterol build-up, or plaque, in the artery walls). Lifestyle interventions such as  dietary choices and physical activity address the cause of atherosclerosis. A review of the types of fats and their impact on blood cholesterol levels, along with dietary recommendations to reduce fat intake is also included.  Nutrition Action Plan  Clinical staff conducted group or individual video education with verbal and written material and guidebook.  Patient learns how to incorporate Pritikin recommendations into their lifestyle. Recommendations include planning and keeping personal health goals in mind as an important part of their success.  Healthy Mind-Set    Healthy Minds, Bodies, Hearts  Clinical staff conducted group or individual video education with verbal and written material and guidebook.  Patient learns how to identify when they are stressed. Video will discuss the impact of that stress, as well as the many benefits of stress management. Patient will also be introduced to stress management techniques. The way we think, act, and feel has an impact on our hearts.  How Our Thoughts Can Heal Our Hearts  Clinical staff conducted group or individual video education with verbal and written material and guidebook.  Patient learns that negative thoughts can cause depression and anxiety. This can result in negative lifestyle behavior and serious health problems. Cognitive behavioral therapy is an effective method to help control our thoughts in order to change and improve our emotional outlook.  Additional Videos:  Exercise    Improving Performance  Clinical staff conducted group or individual video education with verbal and written material and guidebook.  Patient learns to use a non-linear approach by alternating intensity levels and lengths of time spent exercising to help burn more calories and lose more body fat. Cardiovascular exercise helps improve heart health, metabolism, hormonal balance, blood sugar control, and recovery from fatigue. Resistance training improves strength,  endurance, balance, coordination, reaction time, metabolism, and muscle mass. Flexibility exercise improves circulation, posture, and balance. Seek guidance from your physician and exercise physiologist before implementing an exercise routine and learn your capabilities and proper form for all exercise.  Introduction to Yoga  Clinical staff conducted group or individual video education with verbal and written material and guidebook.  Patient learns about yoga, a discipline of the coming together of mind, breath, and body. The benefits of yoga include improved flexibility, improved range of motion, better  posture and core strength, increased lung function, weight loss, and positive self-image. Yoga's heart health benefits include lowered blood pressure, healthier heart rate, decreased cholesterol and triglyceride levels, improved immune function, and reduced stress. Seek guidance from your physician and exercise physiologist before implementing an exercise routine and learn your capabilities and proper form for all exercise.  Medical   Aging: Enhancing Your Quality of Life  Clinical staff conducted group or individual video education with verbal and written material and guidebook.  Patient learns key strategies and recommendations to stay in good physical health and enhance quality of life, such as prevention strategies, having an advocate, securing a Health Care Proxy and Power of Attorney, and keeping a list of medications and system for tracking them. It also discusses how to avoid risk for bone loss.  Biology of Weight Control  Clinical staff conducted group or individual video education with verbal and written material and guidebook.  Patient learns that weight gain occurs because we consume more calories than we burn (eating more, moving less). Even if your body weight is normal, you may have higher ratios of fat compared to muscle mass. Too much body fat puts you at increased risk for  cardiovascular disease, heart attack, stroke, type 2 diabetes, and obesity-related cancers. In addition to exercise, following the Pritikin Eating Plan can help reduce your risk.  Decoding Lab Results  Clinical staff conducted group or individual video education with verbal and written material and guidebook.  Patient learns that lab test reflects one measurement whose values change over time and are influenced by many factors, including medication, stress, sleep, exercise, food, hydration, pre-existing medical conditions, and more. It is recommended to use the knowledge from this video to become more involved with your lab results and evaluate your numbers to speak with your doctor.   Diseases of Our Time - Overview  Clinical staff conducted group or individual video education with verbal and written material and guidebook.  Patient learns that according to the CDC, 50% to 70% of chronic diseases (such as obesity, type 2 diabetes, elevated lipids, hypertension, and heart disease) are avoidable through lifestyle improvements including healthier food choices, listening to satiety cues, and increased physical activity.  Sleep Disorders Clinical staff conducted group or individual video education with verbal and written material and guidebook.  Patient learns how good quality and duration of sleep are important to overall health and well-being. Patient also learns about sleep disorders and how they impact health along with recommendations to address them, including discussing with a physician.  Nutrition  Dining Out - Part 2 Clinical staff conducted group or individual video education with verbal and written material and guidebook.  Patient learns how to plan ahead and communicate in order to maximize their dining experience in a healthy and nutritious manner. Included are recommended food choices based on the type of restaurant the patient is visiting.   Fueling a Banker  conducted group or individual video education with verbal and written material and guidebook.  There is a strong connection between our food choices and our health. Diseases like obesity and type 2 diabetes are very prevalent and are in large-part due to lifestyle choices. The Pritikin Eating Plan provides plenty of food and hunger-curbing satisfaction. It is easy to follow, affordable, and helps reduce health risks.  Menu Workshop  Clinical staff conducted group or individual video education with verbal and written material and guidebook.  Patient learns that restaurant meals can sabotage health goals  because they are often packed with calories, fat, sodium, and sugar. Recommendations include strategies to plan ahead and to communicate with the manager, chef, or server to help order a healthier meal.  Planning Your Eating Strategy  Clinical staff conducted group or individual video education with verbal and written material and guidebook.  Patient learns about the Pritikin Eating Plan and its benefit of reducing the risk of disease. The Pritikin Eating Plan does not focus on calories. Instead, it emphasizes high-quality, nutrient-rich foods. By knowing the characteristics of the foods, we choose, we can determine their calorie density and make informed decisions.  Targeting Your Nutrition Priorities  Clinical staff conducted group or individual video education with verbal and written material and guidebook.  Patient learns that lifestyle habits have a tremendous impact on disease risk and progression. This video provides eating and physical activity recommendations based on your personal health goals, such as reducing LDL cholesterol, losing weight, preventing or controlling type 2 diabetes, and reducing high blood pressure.  Vitamins and Minerals  Clinical staff conducted group or individual video education with verbal and written material and guidebook.  Patient learns different ways to obtain  key vitamins and minerals, including through a recommended healthy diet. It is important to discuss all supplements you take with your doctor.   Healthy Mind-Set    Smoking Cessation  Clinical staff conducted group or individual video education with verbal and written material and guidebook.  Patient learns that cigarette smoking and tobacco addiction pose a serious health risk which affects millions of people. Stopping smoking will significantly reduce the risk of heart disease, lung disease, and many forms of cancer. Recommended strategies for quitting are covered, including working with your doctor to develop a successful plan.  Culinary   Becoming a Set designer conducted group or individual video education with verbal and written material and guidebook.  Patient learns that cooking at home can be healthy, cost-effective, quick, and puts them in control. Keys to cooking healthy recipes will include looking at your recipe, assessing your equipment needs, planning ahead, making it simple, choosing cost-effective seasonal ingredients, and limiting the use of added fats, salts, and sugars.  Cooking - Breakfast and Snacks  Clinical staff conducted group or individual video education with verbal and written material and guidebook.  Patient learns how important breakfast is to satiety and nutrition through the entire day. Recommendations include key foods to eat during breakfast to help stabilize blood sugar levels and to prevent overeating at meals later in the day. Planning ahead is also a key component.  Cooking - Educational psychologist conducted group or individual video education with verbal and written material and guidebook.  Patient learns eating strategies to improve overall health, including an approach to cook more at home. Recommendations include thinking of animal protein as a side on your plate rather than center stage and focusing instead on lower calorie  dense options like vegetables, fruits, whole grains, and plant-based proteins, such as beans. Making sauces in large quantities to freeze for later and leaving the skin on your vegetables are also recommended to maximize your experience.  Cooking - Healthy Salads and Dressing Clinical staff conducted group or individual video education with verbal and written material and guidebook.  Patient learns that vegetables, fruits, whole grains, and legumes are the foundations of the Pritikin Eating Plan. Recommendations include how to incorporate each of these in flavorful and healthy salads, and how to create homemade salad dressings.  Proper handling of ingredients is also covered. Cooking - Soups and [employer] - Soups and Desserts Clinical staff conducted group or individual video education with verbal and written material and guidebook.  Patient learns that Pritikin soups and desserts make for easy, nutritious, and delicious snacks and meal components that are low in sodium, fat, sugar, and calorie density, while high in vitamins, minerals, and filling fiber. Recommendations include simple and healthy ideas for soups and desserts.   Overview     The Pritikin Solution Program Overview Clinical staff conducted group or individual video education with verbal and written material and guidebook.  Patient learns that the results of the Pritikin Program have been documented in more than 100 articles published in peer-reviewed journals, and the benefits include reducing risk factors for (and, in some cases, even reversing) high cholesterol, high blood pressure, type 2 diabetes, obesity, and more! An overview of the three key pillars of the Pritikin Program will be covered: eating well, doing regular exercise, and having a healthy mind-set.  WORKSHOPS  Exercise: Exercise Basics: Building Your Action Plan Clinical staff led group instruction and group discussion with PowerPoint presentation and patient  guidebook. To enhance the learning environment the use of posters, models and videos may be added. At the conclusion of this workshop, patients will comprehend the difference between physical activity and exercise, as well as the benefits of incorporating both, into their routine. Patients will understand the FITT (Frequency, Intensity, Time, and Type) principle and how to use it to build an exercise action plan. In addition, safety concerns and other considerations for exercise and cardiac rehab will be addressed by the presenter. The purpose of this lesson is to promote a comprehensive and effective weekly exercise routine in order to improve patients' overall level of fitness.   Managing Heart Disease: Your Path to a Healthier Heart Clinical staff led group instruction and group discussion with PowerPoint presentation and patient guidebook. To enhance the learning environment the use of posters, models and videos may be added.At the conclusion of this workshop, patients will understand the anatomy and physiology of the heart. Additionally, they will understand how Pritikin's three pillars impact the risk factors, the progression, and the management of heart disease.  The purpose of this lesson is to provide a high-level overview of the heart, heart disease, and how the Pritikin lifestyle positively impacts risk factors.  Exercise Biomechanics Clinical staff led group instruction and group discussion with PowerPoint presentation and patient guidebook. To enhance the learning environment the use of posters, models and videos may be added. Patients will learn how the structural parts of their bodies function and how these functions impact their daily activities, movement, and exercise. Patients will learn how to promote a neutral spine, learn how to manage pain, and identify ways to improve their physical movement in order to promote healthy living. The purpose of this lesson is to expose patients  to common physical limitations that impact physical activity. Participants will learn practical ways to adapt and manage aches and pains, and to minimize their effect on regular exercise. Patients will learn how to maintain good posture while sitting, walking, and lifting.  Balance Training and Fall Prevention  Clinical staff led group instruction and group discussion with PowerPoint presentation and patient guidebook. To enhance the learning environment the use of posters, models and videos may be added. At the conclusion of this workshop, patients will understand the importance of their sensorimotor skills (vision, proprioception, and the vestibular system) in  maintaining their ability to balance as they age. Patients will apply a variety of balancing exercises that are appropriate for their current level of function. Patients will understand the common causes for poor balance, possible solutions to these problems, and ways to modify their physical environment in order to minimize their fall risk. The purpose of this lesson is to teach patients about the importance of maintaining balance as they age and ways to minimize their risk of falling.  WORKSHOPS   Nutrition:  Fueling a Ship broker led group instruction and group discussion with PowerPoint presentation and patient guidebook. To enhance the learning environment the use of posters, models and videos may be added. Patients will review the foundational principles of the Pritikin Eating Plan and understand what constitutes a serving size in each of the food groups. Patients will also learn Pritikin-friendly foods that are better choices when away from home and review make-ahead meal and snack options. Calorie density will be reviewed and applied to three nutrition priorities: weight maintenance, weight loss, and weight gain. The purpose of this lesson is to reinforce (in a group setting) the key concepts around what patients are  recommended to eat and how to apply these guidelines when away from home by planning and selecting Pritikin-friendly options. Patients will understand how calorie density may be adjusted for different weight management goals.  Mindful Eating  Clinical staff led group instruction and group discussion with PowerPoint presentation and patient guidebook. To enhance the learning environment the use of posters, models and videos may be added. Patients will briefly review the concepts of the Pritikin Eating Plan and the importance of low-calorie dense foods. The concept of mindful eating will be introduced as well as the importance of paying attention to internal hunger signals. Triggers for non-hunger eating and techniques for dealing with triggers will be explored. The purpose of this lesson is to provide patients with the opportunity to review the basic principles of the Pritikin Eating Plan, discuss the value of eating mindfully and how to measure internal cues of hunger and fullness using the Hunger Scale. Patients will also discuss reasons for non-hunger eating and learn strategies to use for controlling emotional eating.  Targeting Your Nutrition Priorities Clinical staff led group instruction and group discussion with PowerPoint presentation and patient guidebook. To enhance the learning environment the use of posters, models and videos may be added. Patients will learn how to determine their genetic susceptibility to disease by reviewing their family history. Patients will gain insight into the importance of diet as part of an overall healthy lifestyle in mitigating the impact of genetics and other environmental insults. The purpose of this lesson is to provide patients with the opportunity to assess their personal nutrition priorities by looking at their family history, their own health history and current risk factors. Patients will also be able to discuss ways of prioritizing and modifying the Pritikin  Eating Plan for their highest risk areas  Menu  Clinical staff led group instruction and group discussion with PowerPoint presentation and patient guidebook. To enhance the learning environment the use of posters, models and videos may be added. Using menus brought in from [employer], or printed from [employer], patients will apply the Pritikin dining out guidelines that were presented in the [employer] video. Patients will also be able to practice these guidelines in a variety of provided scenarios. The purpose of this lesson is to provide patients with the opportunity to practice hands-on learning  of the [employer] guidelines with actual menus and practice scenarios.  Label Reading Clinical staff led group instruction and group discussion with PowerPoint presentation and patient guidebook. To enhance the learning environment the use of posters, models and videos may be added. Patients will review and discuss the Pritikin label reading guidelines presented in Pritikin's Label Reading Educational series video. Using fool labels brought in from local grocery stores and markets, patients will apply the label reading guidelines and determine if the packaged food meet the Pritikin guidelines. The purpose of this lesson is to provide patients with the opportunity to review, discuss, and practice hands-on learning of the Pritikin Label Reading guidelines with actual packaged food labels. Cooking School  Pritikin's [employer] are designed to teach patients ways to prepare quick, simple, and affordable recipes at home. The importance of nutrition's role in chronic disease risk reduction is reflected in its emphasis in the overall Pritikin program. By learning how to prepare essential core Pritikin Eating Plan recipes, patients will increase control over what they eat; be able to customize the flavor of foods without the use of added salt, sugar, or fat; and  improve the quality of the food they consume. By learning a set of core recipes which are easily assembled, quickly prepared, and affordable, patients are more likely to prepare more healthy foods at home. These workshops focus on convenient breakfasts, simple entres, side dishes, and desserts which can be prepared with minimal effort and are consistent with nutrition recommendations for cardiovascular risk reduction. Cooking [employer] are taught by a Armed forces logistics/support/administrative officer (RD) who has been trained by the [employer]. The chef or RD has a clear understanding of the importance of minimizing - if not completely eliminating - added fat, sugar, and sodium in recipes. Throughout the series of Cooking School Workshop sessions, patients will learn about healthy ingredients and efficient methods of cooking to build confidence in their capability to prepare    Cooking School weekly topics:  Adding Flavor- Sodium-Free  Fast and Healthy Breakfasts  Powerhouse Plant-Based Proteins  Satisfying Salads and Dressings  Simple Sides and Sauces  International Cuisine-Spotlight on the [employer] Zones  Delicious Desserts  Savory Soups  [employer] - Meals in a Astronomer Appetizers and Snacks  Comforting Weekend Breakfasts  One-Pot Wonders   Fast Evening Meals  Landscape architect Your Pritikin Plate  WORKSHOPS   Healthy Mindset (Psychosocial):  Focused Goals, Sustainable Changes Clinical staff led group instruction and group discussion with PowerPoint presentation and patient guidebook. To enhance the learning environment the use of posters, models and videos may be added. Patients will be able to apply effective goal setting strategies to establish at least one personal goal, and then take consistent, meaningful action toward that goal. They will learn to identify common barriers to achieving personal goals and develop strategies to overcome them. Patients will  also gain an understanding of how our mind-set can impact our ability to achieve goals and the importance of cultivating a positive and growth-oriented mind-set. The purpose of this lesson is to provide patients with a deeper understanding of how to set and achieve personal goals, as well as the tools and strategies needed to overcome common obstacles which may arise along the way.  From Head to Heart: The Power of a Healthy Outlook  Clinical staff led group instruction and group discussion with PowerPoint presentation and patient guidebook. To enhance the learning environment the use of posters,  models and videos may be added. Patients will be able to recognize and describe the impact of emotions and mood on physical health. They will discover the importance of self-care and explore self-care practices which may work for them. Patients will also learn how to utilize the 4 C's to cultivate a healthier outlook and better manage stress and challenges. The purpose of this lesson is to demonstrate to patients how a healthy outlook is an essential part of maintaining good health, especially as they continue their cardiac rehab journey.  Healthy Sleep for a Healthy Heart Clinical staff led group instruction and group discussion with PowerPoint presentation and patient guidebook. To enhance the learning environment the use of posters, models and videos may be added. At the conclusion of this workshop, patients will be able to demonstrate knowledge of the importance of sleep to overall health, well-being, and quality of life. They will understand the symptoms of, and treatments for, common sleep disorders. Patients will also be able to identify daytime and nighttime behaviors which impact sleep, and they will be able to apply these tools to help manage sleep-related challenges. The purpose of this lesson is to provide patients with a general overview of sleep and outline the importance of quality sleep. Patients will  learn about a few of the most common sleep disorders. Patients will also be introduced to the concept of "sleep hygiene," and discover ways to self-manage certain sleeping problems through simple daily behavior changes. Finally, the workshop will motivate patients by clarifying the links between quality sleep and their goals of heart-healthy living.   Recognizing and Reducing Stress Clinical staff led group instruction and group discussion with PowerPoint presentation and patient guidebook. To enhance the learning environment the use of posters, models and videos may be added. At the conclusion of this workshop, patients will be able to understand the types of stress reactions, differentiate between acute and chronic stress, and recognize the impact that chronic stress has on their health. They will also be able to apply different coping mechanisms, such as reframing negative self-talk. Patients will have the opportunity to practice a variety of stress management techniques, such as deep abdominal breathing, progressive muscle relaxation, and/or guided imagery.  The purpose of this lesson is to educate patients on the role of stress in their lives and to provide healthy techniques for coping with it.  Learning Barriers/Preferences:  Learning Barriers/Preferences - 10/11/23 1114       Learning Barriers/Preferences   Learning Barriers None    Learning Preferences Written Material;Pictoral;Group Instruction;Computer/Internet;Individual Instruction;Video;Skilled Demonstration             Education Topics:  Knowledge Questionnaire Score:  Knowledge Questionnaire Score - 10/11/23 1319       Knowledge Questionnaire Score   Pre Score 22/24             Core Components/Risk Factors/Patient Goals at Admission:  Personal Goals and Risk Factors at Admission - 10/11/23 1114       Core Components/Risk Factors/Patient Goals on Admission    Weight Management Yes;Obesity;Weight Loss     Intervention Weight Management/Obesity: Establish reasonable short term and long term weight goals.;Obesity: Provide education and appropriate resources to help participant work on and attain dietary goals.    Admit Weight 200 lb 13.4 oz (91.1 kg)    Expected Outcomes Short Term: Continue to assess and modify interventions until short term weight is achieved;Long Term: Adherence to nutrition and physical activity/exercise program aimed toward attainment of established weight goal;Weight Loss:  Understanding of general recommendations for a balanced deficit meal plan, which promotes 1-2 lb weight loss per week and includes a negative energy balance of [phone] kcal/d    Diabetes Yes    Intervention Provide education about signs/symptoms and action to take for hypo/hyperglycemia.;Provide education about proper nutrition, including hydration, and aerobic/resistive exercise prescription along with prescribed medications to achieve blood glucose in normal ranges: Fasting glucose 65-99 mg/dL    Expected Outcomes Short Term: Participant verbalizes understanding of the signs/symptoms and immediate care of hyper/hypoglycemia, proper foot care and importance of medication, aerobic/resistive exercise and nutrition plan for blood glucose control.;Long Term: Attainment of HbA1C < 7%.    Hypertension Yes    Intervention Provide education on lifestyle modifcations including regular physical activity/exercise, weight management, moderate sodium restriction and increased consumption of fresh fruit, vegetables, and low fat dairy, alcohol moderation, and smoking cessation.;Monitor prescription use compliance.    Expected Outcomes Short Term: Continued assessment and intervention until BP is < 140/23mm HG in hypertensive participants. < 130/103mm HG in hypertensive participants with diabetes, heart failure or chronic kidney disease.;Long Term: Maintenance of blood pressure at goal levels.    Lipids Yes    Intervention Provide  education and support for participant on nutrition & aerobic/resistive exercise along with prescribed medications to achieve LDL 70mg , HDL >40mg .    Expected Outcomes Short Term: Participant states understanding of desired cholesterol values and is compliant with medications prescribed. Participant is following exercise prescription and nutrition guidelines.;Long Term: Cholesterol controlled with medications as prescribed, with individualized exercise RX and with personalized nutrition plan. Value goals: LDL < 70mg , HDL > 40 mg.    Personal Goal Other Yes    Personal Goal Walk more often each day. Develop a daily exercise routine. Learn better cooking habits. Make better food choices.    Intervention Provide individualized exercise plan that Providencia can follow at home. She will attend cooking school to learn heart healthy meals that she can make at home.    Expected Outcomes Lurleen will comply with individualized exercise plan including walking at home. She will follow individualized nutrition action plan.             Core Components/Risk Factors/Patient Goals Review:   Goals and Risk Factor Review     Row Name 10/18/23 0828 11/01/23 1617 11/18/23 0839 12/20/23 1153 01/12/24 0933     Core Components/Risk Factors/Patient Goals Review   Personal Goals Review Weight Management/Obesity;Hypertension;Lipids;Diabetes Weight Management/Obesity;Hypertension;Lipids;Diabetes Weight Management/Obesity;Hypertension;Lipids;Diabetes Weight Management/Obesity;Hypertension;Lipids;Diabetes Weight Management/Obesity;Hypertension;Lipids;Diabetes   Review Kerry Compton started cardiac rehab on 10/17/23. Kerry Compton did well with exercise.Kerry Compton was hypoglycemic post exercise and was treated with juice and glucose gel. Kerry Compton started cardiac rehab on 10/17/23. Kerry Compton is off to a good start  to  exercise. Vital signs have been stable. CBG's have been variable. Kerry Compton has been adjusting her basal insulin via her insulin  pump. Kerry Compton is doing well with exercise at  cardiac rehab on 10/17/23. Vital signs  and CBG's have  been stable. Kerry Compton has lost 1.3 kg since starting cardiac rehab/ Kerry Compton continues to do  well with exercise at  cardiac rehab . Kerry Compton is now doing high intensity on the treadmill. Vital signs  and CBG's have  been stable. Systolic BP's noted in the mid to upper 90's on 12/19/23. Asmptomatic. Will continue to monitor BP's.  Kerry Compton has lost 1.9 kg since starting cardiac rehab. Kerry Compton will complete cardiac rehab on 01/04/24. Kerry Compton continues to do  well with exercise at  cardiac rehab . Kerry Compton is  now doing high intensity on the treadmill. Vital signs  WNL Resting  Systolic BP's noted in the mid to upper 90's at times Asmptomatic. Will continue to monitor BP's.  Kerry Compton has lost 4.0 kg since starting cardiac rehab. Kerry Compton will complete cardiac rehab on 01/20/24. Kerry Compton had a hypoglycemic event on 01/06/24. Will continue to monitor CBG's.   Expected Outcomes Willis will continue to participate in cardiac rehab for exercise, nutrition and lifestyle modifications. CBG's will be within noram parameters Kerry Compton will continue to participate in cardiac rehab for exercise, nutrition and lifestyle modifications. CBG's will be within noram parameters Jeneal will continue to participate in cardiac rehab for exercise, nutrition and lifestyle modifications. CBG's will be within normal  parameters Nicolette will continue to participate in cardiac rehab for exercise, nutrition and lifestyle modifications. CBG's will be within normal  parameters Daysy will continue to participate in cardiac rehab for exercise, nutrition and lifestyle modifications. CBG's will be within normal  parameters            Core Components/Risk Factors/Patient Goals at Discharge (Final Review):   Goals and Risk Factor Review - 01/12/24 0933       Core Components/Risk Factors/Patient Goals Review   Personal Goals Review Weight  Management/Obesity;Hypertension;Lipids;Diabetes    Review Chatara continues to do  well with exercise at  cardiac rehab . Porshia is now doing high intensity on the treadmill. Vital signs  WNL Resting  Systolic BP's noted in the mid to upper 90's at times Asmptomatic. Will continue to monitor BP's.  Jesseka has lost 4.0 kg since starting cardiac rehab. Sanskriti will complete cardiac rehab on 01/20/24. Roby had a hypoglycemic event on 01/06/24. Will continue to monitor CBG's.    Expected Outcomes Laurey will continue to participate in cardiac rehab for exercise, nutrition and lifestyle modifications. CBG's will be within normal  parameters             ITP Comments:  ITP Comments     Row Name 10/11/23 1019 10/18/23 0823 11/01/23 1616 11/18/23 0836 12/20/23 1149   ITP Comments Medical Director- Dr. Armanda Magic, MD. Introduction to the [facility] / Intensive Cardiac Rehab. Reviewed intital orientation folder with participant. 30 Day ITP Review. Permelia started cardiac rehab on 10/17/23. Jerita did well with exercise. Calleigh was hypoglycemic post exercise and was treated with juice and glucose gel. 30 Day ITP Review. Tametha started cardiac rehab on 10/17/23. Lizmary is off to a good start to exercise at cardiac rehab. 30 Day ITP Review. Yamina has good attendance and participation during exercise at cardiac rehab.  cardiac rehab 30 Day ITP Review. Adlyn continues to have  good attendance and participation during exercise at cardiac rehab.  Caree will complete cardiac rehab on 01/04/24    Row Name 01/12/24 0930           ITP Comments 30 Day ITP Review. Chanda continues to have  good attendance and participation during exercise at cardiac rehab.  Candia will complete cardiac rehab on 01/20/24.                Comments: See ITP comments.Thayer Headings RN BSN

## 2024-01-13 ENCOUNTER — Encounter (HOSPITAL_COMMUNITY)
Admission: RE | Admit: 2024-01-13 | Discharge: 2024-01-13 | Disposition: A | Discharge status: Home / Self Care | Payer: Medicare Other | Source: Ambulatory Visit | Attending: Cardiovascular Disease

## 2024-01-13 DIAGNOSIS — Z951 Presence of aortocoronary bypass graft: Secondary | ICD-10-CM

## 2024-01-13 DIAGNOSIS — Z48812 Encounter for surgical aftercare following surgery on the circulatory system: Secondary | ICD-10-CM | POA: Diagnosis not present

## 2024-01-13 DIAGNOSIS — I252 Old myocardial infarction: Secondary | ICD-10-CM | POA: Diagnosis not present

## 2024-01-13 DIAGNOSIS — I214 Non-ST elevation (NSTEMI) myocardial infarction: Secondary | ICD-10-CM

## 2024-01-13 DIAGNOSIS — E162 Hypoglycemia, unspecified: Secondary | ICD-10-CM | POA: Diagnosis not present

## 2024-01-15 NOTE — Progress Notes (Signed)
 Cardiology Office Note:  .   Date:  01/17/2024  ID:  Kerry Compton, DOB Dec 07, 1956, MRN [id number] PCP: Kerry Channel, MD  [facility] Providers Cardiologist:  Kerry ONEIDA Decent, MD   }   History of Present Illness: .   Kerry Compton is a 68 y.o. female history of CAD s/p CABG x 2 (LIMA to the LAD and SVG to OM 09/09/2023), hyperlipidemia, hypertension and type 1 diabetes mellitus last seen by Kerry West, NP on 10/03/2023 posthospitalization after CABG.  She was continued on aspirin  Plavix  Lipitor  and Lopressor .  She was reminded that her LDL was to be less than 55 has been referred to lipid clinic and is now on Repatha .  She states this is getting very expensive for her.  She continues to go to cardiac rehab 3 days a week and is enjoying it very much.  She has lost weight and her blood pressures come down significantly.  She denies any significant dizziness but does on occasion feel lightheaded at cardiac rehab and she states it is likely related to her blood sugar.  She denies any near syncope, palpitations, chest pain, or shortness of breath associated with exercise.  ROS: As above otherwise negative  Studies Reviewed: Kerry Compton      LHC 09/07/2023 Severe 2 vessel obstructive CAD. Occluded proximal LAD with collaterals. High grade proximal LCx Moderate LV dysfunction EF estimated at 35-40% Moderately elevated LVEDP 28 mm Hg Unsuccessful PCI of the LAD due to inability to cross lesion with a wire   Physical Exam:   VS:  BP (!) 90/54 (BP Location: Left Arm, Patient Position: Sitting, Cuff Size: Normal)   Pulse 92   Ht 5' 4.5 (1.638 m)   Wt 191 lb (86.6 kg)   SpO2 97%   BMI 32.28 kg/m    Wt Readings from Last 3 Encounters:  01/17/24 191 lb (86.6 kg)  10/11/23 200 lb 13.4 oz (91.1 kg)  10/05/23 200 lb (90.7 kg)    GEN: Well nourished, well developed in no acute distress NECK: No JVD; No carotid bruits CARDIAC: RRR, no murmurs, rubs, gallops RESPIRATORY:  Clear to  auscultation without rales, wheezing or rhonchi  ABDOMEN: Soft, non-tender, non-distended EXTREMITIES:  No edema; No deformity   ASSESSMENT AND PLAN: .   Coronary artery disease: Severe two-vessel obstructive CAD requiring coronary artery bypass grafting with LIMA to LAD and SVG to OM September 2024.  She is doing very well and is undergoing cardiac rehab which she is enjoying immensely.  She is not having any symptoms.  Continue her on secondary prevention with blood pressure control, lipid management, continued exercise and low-cholesterol diet.  2.  Dyslipidemia: The patient is now on both atorvastatin  80 mg daily and Repatha .  We are reaching out to lipid clinic concerning the cost of Repatha  as it is doubled in cost for her.  Uncertain if she would qualify for assistance.  Goal of LDL less than 50.  3.  Hypertension: She has been hypotensive with increased exercise and weight loss.  It is my recommendation that she stop amlodipine  2.5 mg daily and just continue the valsartan as directed along with the metoprolol .  She prefers to wait and speak with her primary care provider who is she is seeing tomorrow about discontinuing the amlodipine .  4.  Type 1 diabetes: Medications may need to be adjusted as she states that her blood sugar does drop more often lately and has occurred during cardiac rehab.  Will defer  to primary care.  She does have an insulin  pump.         Signed, Lamarr HERO. Jerilynn CHOL, ANP, AACC

## 2024-01-16 ENCOUNTER — Encounter (HOSPITAL_COMMUNITY)
Admission: RE | Admit: 2024-01-16 | Discharge: 2024-01-16 | Disposition: A | Discharge status: Home / Self Care | Payer: Medicare Other | Source: Ambulatory Visit | Attending: Cardiology | Admitting: Cardiology

## 2024-01-16 VITALS — Ht 64.25 in | Wt 194.2 lb

## 2024-01-16 DIAGNOSIS — I214 Non-ST elevation (NSTEMI) myocardial infarction: Secondary | ICD-10-CM | POA: Diagnosis not present

## 2024-01-16 DIAGNOSIS — Z951 Presence of aortocoronary bypass graft: Secondary | ICD-10-CM | POA: Diagnosis not present

## 2024-01-16 DIAGNOSIS — E162 Hypoglycemia, unspecified: Secondary | ICD-10-CM | POA: Insufficient documentation

## 2024-01-17 ENCOUNTER — Encounter: Payer: Self-pay | Admitting: Adult Health

## 2024-01-17 ENCOUNTER — Ambulatory Visit: Payer: Medicare Other | Attending: Adult Health | Admitting: Adult Health

## 2024-01-17 ENCOUNTER — Telehealth: Payer: Self-pay

## 2024-01-17 VITALS — BP 90/54 | HR 92 | Ht 64.5 in | Wt 191.0 lb

## 2024-01-17 DIAGNOSIS — I10 Essential (primary) hypertension: Secondary | ICD-10-CM

## 2024-01-17 DIAGNOSIS — E1065 Type 1 diabetes mellitus with hyperglycemia: Secondary | ICD-10-CM | POA: Diagnosis not present

## 2024-01-17 DIAGNOSIS — Z951 Presence of aortocoronary bypass graft: Secondary | ICD-10-CM | POA: Diagnosis not present

## 2024-01-17 DIAGNOSIS — I2510 Atherosclerotic heart disease of native coronary artery without angina pectoris: Secondary | ICD-10-CM

## 2024-01-17 DIAGNOSIS — E7800 Pure hypercholesterolemia, unspecified: Secondary | ICD-10-CM | POA: Diagnosis not present

## 2024-01-17 NOTE — Patient Instructions (Signed)
 Medication Instructions:  No changes *If you need a refill on your cardiac medications before your next appointment, please call your pharmacy*  Lab Work: No labs  Testing/Procedures: No testing  Follow-Up: At [facility], you and your health needs are our priority.  As part of our continuing mission to provide you with exceptional heart care, we have created designated Provider Care Teams.  These Care Teams include your primary Cardiologist (physician) and Advanced Practice Providers (APPs -  Physician Assistants and Nurse Practitioners) who all work together to provide you with the care you need, when you need it.  We recommend signing up for the patient portal called MyChart.  Sign up information is provided on this After Visit Summary.  MyChart is used to connect with patients for Virtual Visits (Telemedicine).  Patients are able to view lab/test results, encounter notes, upcoming appointments, etc.  Non-urgent messages can be sent to your provider as well.   To learn more about what you can do with MyChart, go to [email or web address].    Your next appointment:   1 year(s)  Provider:   Darryle ONEIDA Decent, MD

## 2024-01-17 NOTE — Telephone Encounter (Signed)
Called patient advised of below they verbalized understanding Forgot to give patient paper work for work patient will be picking up tomorrow. Placed up front in envelope in S section

## 2024-01-17 NOTE — Telephone Encounter (Signed)
-----   Message from Allean Mink sent at 01/17/2024  4:59 PM EST ----- Regarding: RE: Repatha  Her plan, like most, added in an up-front deductible in 2025.  Nobody advertised it, so we've spent the last 5 weeks sharing this same story.  Her plan now requires that she pay $255 plus the $47 copay for the first month.  After that her price will drop to $47 per month for the rest of the year.  She can save $10 by getting a 3 month supply for $131.  But every January she'll probably have that deductible (and I'm sure that will go up every year as well).  If she still needs assistance with the prices, let us  know.   Kristin ----- Message ----- From: Byron Leontine RAMAN, CMA Sent: 01/17/2024   4:37 PM EST To: [facility] Subject: Repatha                                         Hello, Patient was in office today to see Lamarr Satterfield. Patient paying $300 a month for her Repatha  now. Patient and Lamarr would like to confirm if there was anything available to her to help with the cost like a grant or assistance? If someone could reach out to her that would be great thank you!

## 2024-01-18 ENCOUNTER — Encounter (HOSPITAL_COMMUNITY): Payer: Medicare Other

## 2024-01-18 DIAGNOSIS — E042 Nontoxic multinodular goiter: Secondary | ICD-10-CM | POA: Diagnosis not present

## 2024-01-18 DIAGNOSIS — I10 Essential (primary) hypertension: Secondary | ICD-10-CM | POA: Diagnosis not present

## 2024-01-18 DIAGNOSIS — Z0000 Encounter for general adult medical examination without abnormal findings: Secondary | ICD-10-CM | POA: Diagnosis not present

## 2024-01-18 DIAGNOSIS — I2510 Atherosclerotic heart disease of native coronary artery without angina pectoris: Secondary | ICD-10-CM | POA: Diagnosis not present

## 2024-01-18 DIAGNOSIS — R194 Change in bowel habit: Secondary | ICD-10-CM | POA: Diagnosis not present

## 2024-01-18 DIAGNOSIS — E109 Type 1 diabetes mellitus without complications: Secondary | ICD-10-CM | POA: Diagnosis not present

## 2024-01-18 DIAGNOSIS — Z8639 Personal history of other endocrine, nutritional and metabolic disease: Secondary | ICD-10-CM | POA: Diagnosis not present

## 2024-01-18 DIAGNOSIS — J4520 Mild intermittent asthma, uncomplicated: Secondary | ICD-10-CM | POA: Diagnosis not present

## 2024-01-18 DIAGNOSIS — J309 Allergic rhinitis, unspecified: Secondary | ICD-10-CM | POA: Diagnosis not present

## 2024-01-18 DIAGNOSIS — N3281 Overactive bladder: Secondary | ICD-10-CM | POA: Diagnosis not present

## 2024-01-18 DIAGNOSIS — E785 Hyperlipidemia, unspecified: Secondary | ICD-10-CM | POA: Diagnosis not present

## 2024-01-18 DIAGNOSIS — M5134 Other intervertebral disc degeneration, thoracic region: Secondary | ICD-10-CM | POA: Diagnosis not present

## 2024-01-18 DIAGNOSIS — M8588 Other specified disorders of bone density and structure, other site: Secondary | ICD-10-CM | POA: Diagnosis not present

## 2024-01-20 ENCOUNTER — Encounter (HOSPITAL_COMMUNITY)
Admission: RE | Admit: 2024-01-20 | Discharge: 2024-01-20 | Disposition: A | Discharge status: Home / Self Care | Payer: Medicare Other | Source: Ambulatory Visit | Attending: Cardiovascular Disease

## 2024-01-20 DIAGNOSIS — I214 Non-ST elevation (NSTEMI) myocardial infarction: Secondary | ICD-10-CM

## 2024-01-20 DIAGNOSIS — Z951 Presence of aortocoronary bypass graft: Secondary | ICD-10-CM

## 2024-01-20 DIAGNOSIS — E162 Hypoglycemia, unspecified: Secondary | ICD-10-CM | POA: Diagnosis not present

## 2024-01-20 NOTE — Progress Notes (Signed)
 Discharge Progress Report  Patient Details  Name: TESSIE ORDAZ MRN: [id number] Date of Birth: 02/01/1956 Referring Provider:   Flowsheet Row INTENSIVE CARDIAC REHAB ORIENT from 10/11/2023 in [facility]  Referring Provider O'Neal, Darryle Ned, MD        Number of Visits: 64  Reason for Discharge:  Patient reached a stable level of exercise. Patient independent in their exercise. Patient has met program and personal goals.  Smoking History:  Social History   Tobacco Use  Smoking Status Never  Smokeless Tobacco Never    Diagnosis:  09/09/23 CABG x 2  09/06/23 NSTEMI  ADL UCSD:   Initial Exercise Prescription:  Initial Exercise Prescription - 11/07/23 1639       Treadmill   MPH --   2.0 speed, no incline   METs --   2.53 METs at 2/0            Discharge Exercise Prescription (Final Exercise Prescription Changes):  Exercise Prescription Changes - 01/20/24 1606       Response to Exercise   Blood Pressure (Admit) 106/62    Blood Pressure (Exercise) --   No BP needed, over 10 exercise session   Blood Pressure (Exit) 110/70    Heart Rate (Admit) 100 bpm    Heart Rate (Exercise) 123 bpm    Heart Rate (Exit) 95 bpm    Rating of Perceived Exertion (Exercise) 12    Perceived Dyspnea (Exercise) 0    Symptoms none    Comments Pt graduated the [employer] program    Duration Progress to 30 minutes of  aerobic without signs/symptoms of physical distress    Intensity THRR unchanged      Progression   Progression Continue to progress workloads to maintain intensity without signs/symptoms of physical distress.    Average METs 4.5      Resistance Training   Training Prescription Yes    Weight 2 lbs    Reps 10-15    Time 10 Minutes      Interval Training   Interval Training Yes    Equipment Treadmill    Comments 2 min walk 1 min jog      Treadmill   MPH 2.5   Jog 3.4 is 6.21 MET's    Grade 2.5    Minutes 15    METs 3.78      NuStep   Level 2    SPM 111    Minutes 15    METs 3.5      Home Exercise Plan   Plans to continue exercise at Home (comment)    Frequency Add 4 additional days to program exercise sessions.    Initial Home Exercises Provided 11/07/23             Functional Capacity:  6 Minute Walk     Row Name 10/11/23 1155 01/16/24 1630       6 Minute Walk   Phase Initial Discharge    Distance 1374 feet 1607 feet    Distance % Change -- 16.96 %    Distance Feet Change -- 233 ft    Walk Time 6 minutes 6 minutes    # of Rest Breaks 0 0    MPH 2.6 3.04    METS 2.83 3.52    RPE 9 11    Perceived Dyspnea  0 0    VO2 Peak 9.9 12.33    Symptoms No No    Resting  HR 75 bpm 76 bpm    Resting BP 110/62 98/58    Resting Oxygen Saturation  96 % --    Exercise Oxygen Saturation  during 6 min walk 98 % --    Max Ex. HR 94 bpm 113 bpm    Max Ex. BP 138/62 142/62    2 Minute Post BP 128/70 120/62             Psychological, QOL, Others - Outcomes: PHQ 2/9:    01/20/2024    1:31 PM 10/11/2023   10:36 AM 08/05/2016    7:41 AM  Depression screen PHQ 2/9  Decreased Interest 0 0 0  Down, Depressed, Hopeless 0 0 0  PHQ - 2 Score 0 0 0  Altered sleeping 0 0   Tired, decreased energy 0 0   Change in appetite 0 0   Feeling bad or failure about yourself  0 0   Trouble concentrating 0 0   Moving slowly or fidgety/restless 0 0   Suicidal thoughts 0 0   PHQ-9 Score 0 0   Difficult doing work/chores Not difficult at all      Quality of Life:  Quality of Life - 01/20/24 1345       Quality of Life   Select Quality of Life      Quality of Life Scores   Health/Function Post 27.87 %    Socioeconomic Post 29.69 %    Psych/Spiritual Post 28.07 %    Family Post 26.3 %    GLOBAL Post 28.1 %             Personal Goals: Goals established at orientation with interventions provided to work toward goal.  Personal Goals and Risk Factors at  Admission - 10/11/23 1114       Core Components/Risk Factors/Patient Goals on Admission    Weight Management Yes;Obesity;Weight Loss    Intervention Weight Management/Obesity: Establish reasonable short term and long term weight goals.;Obesity: Provide education and appropriate resources to help participant work on and attain dietary goals.    Admit Weight 200 lb 13.4 oz (91.1 kg)    Expected Outcomes Short Term: Continue to assess and modify interventions until short term weight is achieved;Long Term: Adherence to nutrition and physical activity/exercise program aimed toward attainment of established weight goal;Weight Loss: Understanding of general recommendations for a balanced deficit meal plan, which promotes 1-2 lb weight loss per week and includes a negative energy balance of [phone] kcal/d    Diabetes Yes    Intervention Provide education about signs/symptoms and action to take for hypo/hyperglycemia.;Provide education about proper nutrition, including hydration, and aerobic/resistive exercise prescription along with prescribed medications to achieve blood glucose in normal ranges: Fasting glucose 65-99 mg/dL    Expected Outcomes Short Term: Participant verbalizes understanding of the signs/symptoms and immediate care of hyper/hypoglycemia, proper foot care and importance of medication, aerobic/resistive exercise and nutrition plan for blood glucose control.;Long Term: Attainment of HbA1C < 7%.    Hypertension Yes    Intervention Provide education on lifestyle modifcations including regular physical activity/exercise, weight management, moderate sodium restriction and increased consumption of fresh fruit, vegetables, and low fat dairy, alcohol moderation, and smoking cessation.;Monitor prescription use compliance.    Expected Outcomes Short Term: Continued assessment and intervention until BP is < 140/33mm HG in hypertensive participants. < 130/41mm HG in hypertensive participants with  diabetes, heart failure or chronic kidney disease.;Long Term: Maintenance of blood pressure at goal levels.    Lipids Yes  Intervention Provide education and support for participant on nutrition & aerobic/resistive exercise along with prescribed medications to achieve LDL 70mg , HDL >40mg .    Expected Outcomes Short Term: Participant states understanding of desired cholesterol values and is compliant with medications prescribed. Participant is following exercise prescription and nutrition guidelines.;Long Term: Cholesterol controlled with medications as prescribed, with individualized exercise RX and with personalized nutrition plan. Value goals: LDL < 70mg , HDL > 40 mg.    Personal Goal Other Yes    Personal Goal Walk more often each day. Develop a daily exercise routine. Learn better cooking habits. Make better food choices.    Intervention Provide individualized exercise plan that Darsi can follow at home. She will attend cooking school to learn heart healthy meals that she can make at home.    Expected Outcomes Ilaisaane will comply with individualized exercise plan including walking at home. She will follow individualized nutrition action plan.              Personal Goals Discharge:  Goals and Risk Factor Review     Row Name 10/18/23 0828 11/01/23 1617 11/18/23 0839 12/20/23 1153 01/12/24 0933     Core Components/Risk Factors/Patient Goals Review   Personal Goals Review Weight Management/Obesity;Hypertension;Lipids;Diabetes Weight Management/Obesity;Hypertension;Lipids;Diabetes Weight Management/Obesity;Hypertension;Lipids;Diabetes Weight Management/Obesity;Hypertension;Lipids;Diabetes Weight Management/Obesity;Hypertension;Lipids;Diabetes   Review Jolena started cardiac rehab on 10/17/23. Elveria did well with exercise.Keilee was hypoglycemic post exercise and was treated with juice and glucose gel. Sayward started cardiac rehab on 10/17/23. Chaz is off to a good start  to   exercise. Vital signs have been stable. CBG's have been variable. Denesia has been adjusting her basal insulin  via her insulin  pump. Deena is doing well with exercise at  cardiac rehab on 10/17/23. Vital signs  and CBG's have  been stable. Tamila has lost 1.3 kg since starting cardiac rehab/ Zain continues to do  well with exercise at  cardiac rehab . Maddisen is now doing high intensity on the treadmill. Vital signs  and CBG's have  been stable. Systolic BP's noted in the mid to upper 90's on 12/19/23. Asmptomatic. Will continue to monitor BP's.  Krysta has lost 1.9 kg since starting cardiac rehab. Siobahn will complete cardiac rehab on 01/04/24. Shannia continues to do  well with exercise at  cardiac rehab . Aniah is now doing high intensity on the treadmill. Vital signs  WNL Resting  Systolic BP's noted in the mid to upper 90's at times Asmptomatic. Will continue to monitor BP's.  Terrina has lost 4.0 kg since starting cardiac rehab. Yumi will complete cardiac rehab on 01/20/24. Anett had a hypoglycemic event on 01/06/24. Will continue to monitor CBG's.   Expected Outcomes Gracieann will continue to participate in cardiac rehab for exercise, nutrition and lifestyle modifications. CBG's will be within noram parameters Liley will continue to participate in cardiac rehab for exercise, nutrition and lifestyle modifications. CBG's will be within noram parameters Suhey will continue to participate in cardiac rehab for exercise, nutrition and lifestyle modifications. CBG's will be within normal  parameters Gailene will continue to participate in cardiac rehab for exercise, nutrition and lifestyle modifications. CBG's will be within normal  parameters Dennie will continue to participate in cardiac rehab for exercise, nutrition and lifestyle modifications. CBG's will be within normal  parameters            Exercise Goals and Review:  Exercise Goals     Row Name 10/11/23 1038              Exercise  Goals   Increase Physical Activity Yes       Intervention Provide advice, education, support and counseling about physical activity/exercise needs.;Develop an individualized exercise prescription for aerobic and resistive training based on initial evaluation findings, risk stratification, comorbidities and participant's personal goals.       Expected Outcomes Short Term: Attend rehab on a regular basis to increase amount of physical activity.;Long Term: Add in home exercise to make exercise part of routine and to increase amount of physical activity.;Long Term: Exercising regularly at least 3-5 days a week.       Increase Strength and Stamina Yes       Intervention Provide advice, education, support and counseling about physical activity/exercise needs.;Develop an individualized exercise prescription for aerobic and resistive training based on initial evaluation findings, risk stratification, comorbidities and participant's personal goals.       Expected Outcomes Short Term: Increase workloads from initial exercise prescription for resistance, speed, and METs.;Short Term: Perform resistance training exercises routinely during rehab and add in resistance training at home;Long Term: Improve cardiorespiratory fitness, muscular endurance and strength as measured by increased METs and functional capacity ( )       Able to understand and use rate of perceived exertion (RPE) scale Yes       Intervention Provide education and explanation on how to use RPE scale       Expected Outcomes Short Term: Able to use RPE daily in rehab to express subjective intensity level;Long Term:  Able to use RPE to guide intensity level when exercising independently       Knowledge and understanding of Target Heart Rate Range (THRR) Yes       Intervention Provide education and explanation of THRR including how the numbers were predicted and where they are located for reference       Expected Outcomes Short Term: Able to  state/look up THRR;Long Term: Able to use THRR to govern intensity when exercising independently;Short Term: Able to use daily as guideline for intensity in rehab       Able to check pulse independently Yes       Intervention Provide education and demonstration on how to check pulse in carotid and radial arteries.;Review the importance of being able to check your own pulse for safety during independent exercise       Expected Outcomes Short Term: Able to explain why pulse checking is important during independent exercise;Long Term: Able to check pulse independently and accurately       Understanding of Exercise Prescription Yes       Intervention Provide education, explanation, and written materials on patient's individual exercise prescription       Expected Outcomes Short Term: Able to explain program exercise prescription;Long Term: Able to explain home exercise prescription to exercise independently                Exercise Goals Re-Evaluation:  Exercise Goals Re-Evaluation     Row Name 10/17/23 1654 11/07/23 1643 11/28/23 1704 01/04/24 1630 01/20/24 1609     Exercise Goal Re-Evaluation   Exercise Goals Review Increase Physical Activity;Understanding of Exercise Prescription;Increase Strength and Stamina;Knowledge and understanding of Target Heart Rate Range (THRR);Able to understand and use rate of perceived exertion (RPE) scale Increase Physical Activity;Understanding of Exercise Prescription;Increase Strength and Stamina;Knowledge and understanding of Target Heart Rate Range (THRR);Able to understand and use rate of perceived exertion (RPE) scale Increase Physical Activity;Understanding of Exercise Prescription;Increase Strength and Stamina;Knowledge and understanding of Target Heart Rate Range (THRR);Able to  understand and use rate of perceived exertion (RPE) scale Increase Physical Activity;Understanding of Exercise Prescription;Increase Strength and Stamina;Knowledge and understanding of  Target Heart Rate Range (THRR);Able to understand and use rate of perceived exertion (RPE) scale Increase Physical Activity;Understanding of Exercise Prescription;Increase Strength and Stamina;Knowledge and understanding of Target Heart Rate Range (THRR);Able to understand and use rate of perceived exertion (RPE) scale   Comments Pt first day in the [facility] [facility] program. Pt tolerated exercise well with an average MET level of 2.37. Pt is learning her THRR, RPE and ExRx. Reviewed MET's, goals and home ExRx. Pt tolerated exercise well with an average MET level of 2.45. Pt is feeling good about her goals and is getting into a good exercise routine. She is walking on the days she does not come to cardiac rehab. She is also adding in some handweights and stretches. She feels good about her exercise plan and is gaining strength and stamina. she is meeting ACSM recommendations for 30 mins 3-4 days Reviewed MET's and goals. Pt tolerated exercise well with an average MET level of 2.91. Pt is feeling good  and started jogging in intervals today. She did very well. She is increasing strength, stamina and is enjoying her new exercise routine Reviewed MET's and goals. Pt tolerated exercise well with an average MET level of 4.09. Pt is doing well and is increasing METs. She is feeling good with her exercise routine Pt graduated the [employer] program. Pt tolerated exercise well with an average MET level of 4.5. She did very well in her time at [facility], she was able to begin joggin in intervals and has increased her strength. She increased on her post by 239ft for a total od 1626ft. She was also able to increase her balance and decrease her %BF. She will keep up her good progress by joining the PREP program at the [employer], going to extra [facility] classes, walking/jogging and doing hand weights for 6-7 days for 30-60 mins per session   Expected Outcomes Will continue to monitor pt and progress workloads as tolerated without  sign or symptom Will continue to monitor pt and progress workloads as tolerated without sign or symptom Will continue to monitor pt and progress workloads as tolerated without sign or symptom Will continue to monitor pt and progress workloads as tolerated without sign or symptom Pt will continue to exercise on her own and gain strength            Nutrition & Weight - Outcomes:  Pre Biometrics - 10/11/23 1019       Pre Biometrics   Waist Circumference 40 inches    Hip Circumference 46.5 inches    Waist to Hip Ratio 0.86 %    Triceps Skinfold 35 mm    % Body Fat 45.2 %    Grip Strength 24 kg    Flexibility 15 in    Single Leg Stand 5.18 seconds             Post Biometrics - 01/16/24 1630        Post  Biometrics   Height 5' 4.25 (1.632 m)    Weight 88.1 kg    Waist Circumference 38.25 inches    Hip Circumference 43.5 inches    Waist to Hip Ratio 0.88 %    BMI (Calculated) 33.08    Triceps Skinfold 35 mm    % Body Fat 44 %    Grip Strength 28 kg    Flexibility 17 in  Single Leg Stand 9 seconds             Nutrition:  Nutrition Therapy & Goals - 10/18/23 0951       Nutrition Therapy   Diet Heart Healthy/Carbohydrate Consistent diet    Drug/Food Interactions Statins/Certain Fruits      Personal Nutrition Goals   Nutrition Goal Patient to identify strategies for reducing cardiovascular risk by attending the [employer] education and nutrition series weekly.    Personal Goal #2 Patient to improve diet quality by using the plate method as a guide for meal planning to include lean protein/plant protein, fruits, vegetables, whole grains, nonfat dairy as part of a well-balanced diet.    Personal Goal #3 Patient to identify strategies for blood sugar control with goal A1c <7%.    Comments Carlen has medical history of HTN, NSTEMI, DM1, s/pCABG x2, hyperlipidemia. LDL remains above goal; repatha  was approved on 10/17/23. Patient will benefit from participation in  intensive cardiac rehab for nutrition, exercise, and lifestyle modification.      Intervention Plan   Intervention Prescribe, educate and counsel regarding individualized specific dietary modifications aiming towards targeted core components such as weight, hypertension, lipid management, diabetes, heart failure and other comorbidities.;Nutrition handout(s) given to patient.    Expected Outcomes Short Term Goal: Understand basic principles of dietary content, such as calories, fat, sodium, cholesterol and nutrients.;Long Term Goal: Adherence to prescribed nutrition plan.             Nutrition Discharge:  Nutrition Assessments - 01/20/24 1349       Rate Your Plate Scores   Post Score 95             Education Questionnaire Score:  Knowledge Questionnaire Score - 01/20/24 1404       Knowledge Questionnaire Score   Post Score 24/24             Goals reviewed with patient; copy given to patient.Pt graduates from  Intensive/Traditional cardiac rehab program on 01/20/24 with completion of  64 exercise and education sessions. Pt maintained good attendance and progressed nicely during their participation in rehab as evidenced by increased MET level. Serine increased her distance on her post exercise walk test by 233 feet and lost 4.5 kg.   Medication list reconciled. Repeat  PHQ score- 0 .  Pt has made significant lifestyle changes and should be commended for their success. Shanterria  achieved their goals during cardiac rehab.   Pt plans to continue exercise at the prep program via the [facility]. We are proud of Vanessa's progress and weight loss.Hadassah Elpidio Quan RN BSN

## 2024-01-23 NOTE — Progress Notes (Signed)
 [facility] PREP Evaluation  Patient Details  Name: Kerry Compton MRN: [id number] Date of Birth: [birth year] Age: 68 y.o. PCP: Victorio Grave, MD  Vitals:   01/23/24 1042  BP: 118/74  Pulse: 70  SpO2: 98%  Weight: 190 lb 12.8 oz (86.5 kg)     [facility] Eval - 01/23/24 1000       [facility] "PREP" Location   [facility] "PREP" Location [facility]      Referral    Referring Provider Cardiac Rehab    Reason for referral Diabetes;High Cholesterol;Hypertension;Obesitity/Overweight    Program Start Date 01/31/24      Measurement   Waist Circumference 39 inches    Hip Circumference 45 inches    Body fat --   E4     Information for Trainer   Goals --   Establish strength training program to be able to do a pull up; lose 10-15 pounds by end of program; increase stamina   Current Exercise --   just completed CR   Pertinent Medical History --   HTN, s/p MI w/CABG x2 '24; diabetes, hyperlipidemia   Medications that affect exercise Beta blocker      Timed Up and Go (TUGS)   Timed Up and Go Low risk <9 seconds      Mobility and Daily Activities   I find it easy to walk up or down two or more flights of stairs. 1    I have no trouble taking out the trash. 4    I do housework such as vacuuming and dusting on my own without difficulty. 4    I can easily lift a gallon of milk (8lbs). 4    I can easily walk a mile. 4    I have no trouble reaching into high cupboards or reaching down to pick up something from the floor. 4    I do not have trouble doing out-door work such as Loss adjuster, chartered, raking leaves, or gardening. 4      Mobility and Daily Activities   I feel younger than my age. 4    I feel independent. 4    I feel energetic. 4    I live an active life.  4    I feel strong. 4    I feel healthy. 4    I feel active as other people my age. 4      How fit and strong are you.   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, no current liver problems   Hyperlipemia    Hypertension    Past Surgical History:  Procedure Laterality Date   COLONOSCOPY WITH PROPOFOL  N/A 06/18/2014   Procedure: COLONOSCOPY WITH PROPOFOL ;  Surgeon: Garrett Kallman, MD;  Location: [facility] ENDOSCOPY;  Service: Endoscopy;  Laterality: N/A;   CORONARY ARTERY BYPASS GRAFT N/A 09/09/2023   Procedure: CORONARY ARTERY BYPASS GRAFTING (CABG) TIMES TWO UTILIZING LEFT INTERNAL MAMMARY ARTERY AND ENDOSCOPIC VEIN HARVEST RIGHT GREATER SAPHENOUS VEIN;  Surgeon: Bartley Lightning, MD;  Location: [facility] OR;  Service: Open Heart Surgery;  Laterality: N/A;   LEFT HEART CATH AND CORONARY ANGIOGRAPHY N/A 09/07/2023   Procedure: LEFT HEART CATH AND CORONARY ANGIOGRAPHY;  Surgeon: Swazil[place], Peter M, MD;  Location: [facility] INVASIVE CV LAB;  Service: Cardiovascular;  Laterality: N/A;   ROTATOR CUFF REPAIR Right 10 yrs ago   SEPTOPLASTY     TEE WITHOUT CARDIOVERSION N/A 09/09/2023   Procedure: TRANSESOPHAGEAL ECHOCARDIOGRAM;  Surgeon: Bartley Lightning, MD;  Location: [facility] OR;  Service: Open Heart Surgery;  Laterality: N/A;   TUBAL LIGATION     Social History   Tobacco Use  Smoking Status Never  Smokeless Tobacco Never  To begin PREP class on 01/31/24, every T/Th 10-11:15  Alfonse Garringer B Kennett Symes 01/23/2024, 10:46 AM

## 2024-01-24 DIAGNOSIS — Z794 Long term (current) use of insulin: Secondary | ICD-10-CM | POA: Diagnosis not present

## 2024-01-24 DIAGNOSIS — E109 Type 1 diabetes mellitus without complications: Secondary | ICD-10-CM | POA: Diagnosis not present

## 2024-01-24 DIAGNOSIS — E1065 Type 1 diabetes mellitus with hyperglycemia: Secondary | ICD-10-CM | POA: Diagnosis not present

## 2024-01-30 DIAGNOSIS — R197 Diarrhea, unspecified: Secondary | ICD-10-CM | POA: Diagnosis not present

## 2024-01-30 DIAGNOSIS — K219 Gastro-esophageal reflux disease without esophagitis: Secondary | ICD-10-CM | POA: Diagnosis not present

## 2024-01-31 DIAGNOSIS — R197 Diarrhea, unspecified: Secondary | ICD-10-CM | POA: Diagnosis not present

## 2024-02-07 NOTE — Progress Notes (Signed)
 [facility] PREP Weekly Session  Patient Details  Name: Kerry Compton MRN: [id number] Date of Birth: 1956-01-15 Age: 68 y.o. PCP: Laurann Montana, MD  There were no vitals filed for this visit.   [facility] Weekly seesion - 02/07/24 1300       [facility] "PREP" Location   [facility] "PREP" Location [facility]      Weekly Session   Topic Discussed Goal setting and welcome to the program   Introductions, review of notebook, tour of facility; offered option for short warm up on cardio machine   Classes attended to date 1             Joseth Weigel B Alver Leete 02/07/2024, 1:29 PM

## 2024-02-13 ENCOUNTER — Telehealth: Payer: Self-pay | Admitting: *Deleted

## 2024-02-13 NOTE — Telephone Encounter (Signed)
   Pre-operative Risk Assessment    Patient Name: Kerry Compton  DOB: [birth year] MRN: [id number]   Date of last office visit: 01/17/2024 Date of next office visit: None   Request for Surgical Clearance    Procedure:   Colonoscopy/Endoscopy  Date of Surgery:  Clearance 03/19/24                                 Surgeon:  Dr. Willis Modena Surgeon's Group or Practice Name:  [facility] Phone number:  [phone] Fax number:  [phone]   Type of Clearance Requested:   - Medical  - Pharmacy:  Hold Aspirin and Clopidogrel (Plavix) 5 days prior   Type of Anesthesia:   Propofol   Additional requests/questions:    Signed, Emmit Pomfret   02/13/2024, 11:11 AM

## 2024-02-14 NOTE — Telephone Encounter (Signed)
   Patient Name: Kerry Compton  DOB: 03/27/1956 MRN: [id number]  Primary Cardiologist: Reatha Harps, MD  Chart reviewed as part of pre-operative protocol coverage. Given past medical history and time since last visit, based on ACC/AHA guidelines, Kerry Compton is at acceptable risk for the planned procedure without further cardiovascular testing.   Patient was last seen in office on 01/17/2024 by Joni Reining, NP, and was doing well.   Per Dr. Flora Lipps, she may hold both aspirin and Plavix for 5 days prior to procedure.  Please resume aspirin and Plavix as soon as possible postprocedure, the discretion of the surgeon.  I will route this recommendation to the requesting party via Epic fax function and remove from pre-op pool.  Please call with questions.  Joylene Grapes, NP 02/14/2024, 8:04 AM

## 2024-02-14 NOTE — Progress Notes (Signed)
 [facility] Weekly Session  Patient Details  Name: DESTENY FREEMAN MRN: [id number] Date of Birth: [birth year] Age: 68 y.o. PCP: Laurann Montana, MD  There were no vitals filed for this visit.   [facility] Weekly seesion - 02/14/24 1100       [facility] "PREP[facility] Location   [facility]PREP[facility] Location [facility]      Weekly Session   Topic Discussed Importance of resistance training;Other ways to be active   Goal by the end of the program: 150 min/wk of cardio; strength training 2-3 times/wk for 20-40 minutes   Classes attended to date 3             Avice Funchess B Ezmae Speers 02/14/2024, 11:49 AM

## 2024-02-21 NOTE — Progress Notes (Signed)
 [facility] Weekly Session  Patient Details  Name: CALIYAH SIEH MRN: [id number] Date of Birth: 07/24/1956 Age: 68 y.o. PCP: Laurann Montana, MD  Vitals:   02/21/24 1142  Weight: 190 lb 9.6 oz (86.5 kg)     [facility] Weekly seesion - 02/21/24 1100       [facility]PREP[facility] Location   [facility]PREP[facility] Location [facility]      Weekly Session   Topic Discussed Healthy eating tips   Foods to decrease, foods to increase; eat the rainbow of colors; introduced to [employer]   Minutes exercised this week 280 minutes    Classes attended to date 5             Krisandra Bueno B Neisha Hinger 02/21/2024, 11:43 AM

## 2024-02-22 DIAGNOSIS — E109 Type 1 diabetes mellitus without complications: Secondary | ICD-10-CM | POA: Diagnosis not present

## 2024-02-22 DIAGNOSIS — Z794 Long term (current) use of insulin: Secondary | ICD-10-CM | POA: Diagnosis not present

## 2024-02-22 DIAGNOSIS — Z8639 Personal history of other endocrine, nutritional and metabolic disease: Secondary | ICD-10-CM | POA: Diagnosis not present

## 2024-02-22 DIAGNOSIS — I10 Essential (primary) hypertension: Secondary | ICD-10-CM | POA: Diagnosis not present

## 2024-02-29 ENCOUNTER — Other Ambulatory Visit: Payer: Self-pay | Admitting: Cardiology

## 2024-03-04 ENCOUNTER — Other Ambulatory Visit: Payer: Self-pay | Admitting: Cardiology

## 2024-03-06 NOTE — Progress Notes (Signed)
 [facility] PRE[facility] Weekly Session  Patient Details  Name: Kerry Compton MRN: [id number] Date of Birth: 1956-03-30 Age: 68 y.o. PCP: Laurann Montana, MD  Vitals:   03/06/24 1135  Weight: 188 lb 6.4 oz (85.5 kg)     [facility] Weekly seesion - 03/06/24 1100       [facility]PREP[facility]   [facility]PREP[facility] Location [facility]      Weekly Session   Topic Discussed Restaurant Eating   Salt demo; review of low sodium flavoring tips   Minutes exercised this week 90 minutes    Classes attended to date 7             Kalkidan Caudell B Chenita Ruda 03/06/2024, 11:36 AM

## 2024-03-12 ENCOUNTER — Other Ambulatory Visit: Payer: Self-pay | Admitting: Cardiovascular Disease

## 2024-03-12 DIAGNOSIS — I2510 Atherosclerotic heart disease of native coronary artery without angina pectoris: Secondary | ICD-10-CM

## 2024-03-12 DIAGNOSIS — Z951 Presence of aortocoronary bypass graft: Secondary | ICD-10-CM

## 2024-03-12 DIAGNOSIS — E785 Hyperlipidemia, unspecified: Secondary | ICD-10-CM

## 2024-03-13 NOTE — Progress Notes (Signed)
 [facility] Weekly Session  Patient Details  Name: Kerry Compton MRN: [id number] Date of Birth: [birth year] Age: 68 y.o. PCP: Laurann Montana, MD  Vitals:   03/13/24 1119  Weight: 188 lb 3.2 oz (85.4 kg)     [facility] Weekly seesion - 03/13/24 1100       [facility]PREP[facility] Location   [facility]PREP[facility] Location [facility]      Weekly Session   Topic Discussed Stress management and problem solving   importance of 7-9 hrs/night of sleep; finger tip mudra breathwork; sleep guidelines   Minutes exercised this week 270 minutes    Classes attended to date 8             Kerry Compton Kerry Compton 03/13/2024, 11:20 AM

## 2024-03-19 DIAGNOSIS — K648 Other hemorrhoids: Secondary | ICD-10-CM | POA: Diagnosis not present

## 2024-03-19 DIAGNOSIS — D120 Benign neoplasm of cecum: Secondary | ICD-10-CM | POA: Diagnosis not present

## 2024-03-19 DIAGNOSIS — K5289 Other specified noninfective gastroenteritis and colitis: Secondary | ICD-10-CM | POA: Diagnosis not present

## 2024-03-19 DIAGNOSIS — K5730 Diverticulosis of large intestine without perforation or abscess without bleeding: Secondary | ICD-10-CM | POA: Diagnosis not present

## 2024-03-19 DIAGNOSIS — R195 Other fecal abnormalities: Secondary | ICD-10-CM | POA: Diagnosis not present

## 2024-03-19 DIAGNOSIS — R194 Change in bowel habit: Secondary | ICD-10-CM | POA: Diagnosis not present

## 2024-03-20 NOTE — Progress Notes (Signed)
 [facility] Weekly Session  Patient Details  Name: Kerry Compton MRN: [id number] Date of Birth: May 03, 1956 Age: 68 y.o. PCP: Laurann Montana, MD  Vitals:   03/20/24 1121  Weight: 187 lb 4.8 oz (85 kg)     [facility] Weekly seesion - 03/20/24 1100       [facility]PREP[facility] Location   [facility]PREP[facility] Location [facility]      Weekly Session   Topic Discussed Expectations and non-scale victories   Halfway thru program, review/revisit/restate goals; staying positive   Minutes exercised this week 270 minutes    Classes attended to date 10             Lorin Hauck B Vennessa Affinito 03/20/2024, 11:21 AM

## 2024-03-21 DIAGNOSIS — D120 Benign neoplasm of cecum: Secondary | ICD-10-CM | POA: Diagnosis not present

## 2024-03-21 DIAGNOSIS — K5289 Other specified noninfective gastroenteritis and colitis: Secondary | ICD-10-CM | POA: Diagnosis not present

## 2024-03-27 NOTE — Progress Notes (Signed)
 [facility] Weekly Session  Patient Details  Name: Kerry Compton MRN: [id number] Date of Birth: Jul 06, 1956 Age: 68 y.o. PCP: Victorio Grave, MD  Vitals:   03/27/24 1123  Weight: 184 lb 6.4 oz (83.6 kg)     [facility] Weekly seesion - 03/27/24 1100       [facility]PREP[facility] Location   [facility]PREP[facility] Location [facility]      Weekly Session   Topic Discussed Other   Portion control; visualize your portion size demo; review of [employer] sugar craisins food label.   Minutes exercised this week 300 minutes    Classes attended to date 97             Kerry Compton Kerry Compton 03/27/2024, 11:24 AM

## 2024-04-03 NOTE — Progress Notes (Signed)
 [facility] Weekly Session  Patient Details  Name: Kerry Compton MRN: [id number] Date of Birth: Feb 29, 1956 Age: 68 y.o. PCP: Victorio Grave, MD  Vitals:   04/03/24 1114  Weight: 184 lb 9.6 oz (83.7 kg)     [facility] Weekly seesion - 04/03/24 1100       [facility]PREP[facility] Location   [facility]PREP[facility] Location [facility]      Weekly Session   Topic Discussed Eating for the season;Finding support   Managing Holiday eating---wt maintenance vs. wt loss   Minutes exercised this week 330 minutes    Classes attended to date 27             Jacalynn Buzzell B Kemper Heupel 04/03/2024, 11:15 AM

## 2024-04-10 NOTE — Progress Notes (Signed)
 [facility] PRE[facility] Weekly Session  Patient Details  Name: VIDETTE WILKINSON MRN: [id number] Date of Birth: 1956/06/22 Age: 68 y.o. PCP: Victorio Grave, MD  Vitals:   04/10/24 1156  Weight: 183 lb (83 kg)     [facility] Weekly seesion - 04/10/24 1100       [facility] "PREP" Location   [facility]PREP[facility] Location [facility]      Weekly Session   Topic Discussed Calorie breakdown   Carbs, fats and proteins; simple vs. complex carbs; nutritional supplement trustworthy organizations; membership talk w/Nick   Minutes exercised this week 370 minutes    Classes attended to date 45             Chrystina Naff B Melena Hayes 04/10/2024, 11:57 AM

## 2024-04-17 NOTE — Progress Notes (Signed)
 [facility] PRE[facility] Weekly Session  Patient Details  Name: Kerry Compton MRN: [id number] Date of Birth: 1956-03-03 Age: 68 y.o. PCP: Victorio Grave, MD  Vitals:   04/17/24 1146  Weight: 182 lb 4.8 oz (82.7 kg)     [facility] Weekly seesion - 04/17/24 1100       [facility]PREP[facility] Location   [facility]PREP[facility] Location [facility]      Weekly Session   Topic Discussed Hitting roadblocks   Reviewed end of program documentation, reviewed 100 calorie food labels.   Minutes exercised this week 240 minutes    Classes attended to date 58             Kerry Compton [name] 04/17/2024, 11:47 AM

## 2024-04-24 NOTE — Progress Notes (Signed)
 [facility] PRE[facility] Weekly Session  Patient Details  Name: Kerry Compton MRN: [id number] Date of Birth: 1956-05-23 Age: 68 y.o. PCP: Victorio Grave, MD  Vitals:   04/24/24 1107  Weight: 183 lb 6.4 oz (83.2 kg)     [facility] Weekly seesion - 04/24/24 1100       [facility]PREP[facility] Location   [facility]PREP[facility] Location [facility]      Weekly Session   Topic Discussed Other   How fit and strong survey completed; fit testing completed; final documents reviewed, final assessment visit scheduled for Thursday   Minutes exercised this week 390 minutes    Classes attended to date 20             Maryhelen Lindler B Mcihael Hinderman 04/24/2024, 11:10 AM

## 2024-04-26 NOTE — Progress Notes (Signed)
 [facility] PREP Evaluation  Patient Details  Name: Kerry Compton MRN: [id number] Date of Birth: 11/25/1956 Age: 68 y.o. PCP: Victorio Grave, MD  Vitals:   04/26/24 0954  BP: 130/70  Pulse: 63  SpO2: 97%  Weight: 183 lb 6.4 oz (83.2 kg)     [facility] Eval - 04/26/24 0900       [facility] "PREP[facility] Location   [facility]PREP[facility] Location [facility]      Referral    Program Start Date 01/23/24    Program End Date 04/26/24      Measurement   Waist Circumference 39 inches    Waist Circumference End Program 38 inches    Hip Circumference 45 inches    Hip Circumference End Program 42 inches    Body fat 44 percent      Mobility and Daily Activities   I find it easy to walk up or down two or more flights of stairs. 2    I have no trouble taking out the trash. 4    I do housework such as vacuuming and dusting on my own without difficulty. 4    I can easily lift a gallon of milk (8lbs). 4    I can easily walk a mile. 4    I have no trouble reaching into high cupboards or reaching down to pick up something from the floor. 4    I do not have trouble doing out-door work such as Loss adjuster, chartered, raking leaves, or gardening. 3      Mobility and Daily Activities   I feel younger than my age. 3    I feel independent. 4    I feel energetic. 3    I live an active life.  4    I feel strong. 4    I feel healthy. 4    I feel active as other people my age. 4      How fit and strong are you.   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, no current liver problems   Hyperlipemia    Hypertension    Past Surgical History:  Procedure Laterality Date   COLONOSCOPY WITH PROPOFOL  N/A 06/18/2014   Procedure: COLONOSCOPY WITH PROPOFOL ;  Surgeon: Garrett Kallman, MD;  Location: [facility];  Service: Endoscopy;  Laterality: N/A;   CORONARY ARTERY BYPASS GRAFT N/A 09/09/2023    Procedure: CORONARY ARTERY BYPASS GRAFTING (CABG) TIMES TWO UTILIZING LEFT INTERNAL MAMMARY ARTERY AND ENDOSCOPIC VEIN HARVEST RIGHT GREATER SAPHENOUS VEIN;  Surgeon: Bartley Lightning, MD;  Location: [facility] OR;  Service: Open Heart Surgery;  Laterality: N/A;   LEFT HEART CATH AND CORONARY ANGIOGRAPHY N/A 09/07/2023   Procedure: LEFT HEART CATH AND CORONARY ANGIOGRAPHY;  Surgeon: [place], Peter M, MD;  Location: [facility] INVASIVE CV LAB;  Service: Cardiovascular;  Laterality: N/A;   ROTATOR CUFF REPAIR Right 10 yrs ago   SEPTOPLASTY     TEE WITHOUT CARDIOVERSION N/A 09/09/2023   Procedure: TRANSESOPHAGEAL ECHOCARDIOGRAM;  Surgeon: Bartley Lightning, MD;  Location: [facility] OR;  Service: Open Heart Surgery;  Laterality: N/A;   TUBAL LIGATION     Social History   Tobacco Use  Smoking Status Never  Smokeless Tobacco Never  Wt loss: 7.4 pounds Inches lost: 4 Cardio march improved  from 283 to 334 Sit to stands increased from 14 to 16 Bicep curls 21 to 26 Education sessions completed: 11 Workout sessions completed: 9  [name] B [name] 04/26/2024, 9:57 AM

## 2024-05-30 DIAGNOSIS — Z8639 Personal history of other endocrine, nutritional and metabolic disease: Secondary | ICD-10-CM | POA: Diagnosis not present

## 2024-05-30 DIAGNOSIS — I10 Essential (primary) hypertension: Secondary | ICD-10-CM | POA: Diagnosis not present

## 2024-05-30 DIAGNOSIS — Z794 Long term (current) use of insulin: Secondary | ICD-10-CM | POA: Diagnosis not present

## 2024-05-30 DIAGNOSIS — E109 Type 1 diabetes mellitus without complications: Secondary | ICD-10-CM | POA: Diagnosis not present

## 2024-06-12 DIAGNOSIS — E109 Type 1 diabetes mellitus without complications: Secondary | ICD-10-CM | POA: Diagnosis not present

## 2024-06-12 DIAGNOSIS — Z794 Long term (current) use of insulin: Secondary | ICD-10-CM | POA: Diagnosis not present

## 2024-06-12 DIAGNOSIS — E1065 Type 1 diabetes mellitus with hyperglycemia: Secondary | ICD-10-CM | POA: Diagnosis not present

## 2024-07-19 DIAGNOSIS — Z794 Long term (current) use of insulin: Secondary | ICD-10-CM | POA: Diagnosis not present

## 2024-07-19 DIAGNOSIS — E785 Hyperlipidemia, unspecified: Secondary | ICD-10-CM | POA: Diagnosis not present

## 2024-07-19 DIAGNOSIS — I10 Essential (primary) hypertension: Secondary | ICD-10-CM | POA: Diagnosis not present

## 2024-07-19 DIAGNOSIS — I2510 Atherosclerotic heart disease of native coronary artery without angina pectoris: Secondary | ICD-10-CM | POA: Diagnosis not present

## 2024-07-19 DIAGNOSIS — E109 Type 1 diabetes mellitus without complications: Secondary | ICD-10-CM | POA: Diagnosis not present

## 2024-07-19 DIAGNOSIS — Z23 Encounter for immunization: Secondary | ICD-10-CM | POA: Diagnosis not present

## 2024-07-19 DIAGNOSIS — J4520 Mild intermittent asthma, uncomplicated: Secondary | ICD-10-CM | POA: Diagnosis not present

## 2024-07-19 DIAGNOSIS — J309 Allergic rhinitis, unspecified: Secondary | ICD-10-CM | POA: Diagnosis not present

## 2024-08-31 DIAGNOSIS — Z8639 Personal history of other endocrine, nutritional and metabolic disease: Secondary | ICD-10-CM | POA: Diagnosis not present

## 2024-08-31 DIAGNOSIS — Z794 Long term (current) use of insulin: Secondary | ICD-10-CM | POA: Diagnosis not present

## 2024-08-31 DIAGNOSIS — E109 Type 1 diabetes mellitus without complications: Secondary | ICD-10-CM | POA: Diagnosis not present

## 2024-08-31 DIAGNOSIS — I10 Essential (primary) hypertension: Secondary | ICD-10-CM | POA: Diagnosis not present
